# Patient Record
Sex: Male | Born: 1975
Health system: Southern US, Community
[De-identification: ages and names within clinical notes are randomized; demographics above are authoritative.]

## PROBLEM LIST (undated history)

## (undated) DIAGNOSIS — E119 Type 2 diabetes mellitus without complications: Secondary | ICD-10-CM

## (undated) DIAGNOSIS — J45909 Unspecified asthma, uncomplicated: Secondary | ICD-10-CM

## (undated) DIAGNOSIS — I10 Essential (primary) hypertension: Secondary | ICD-10-CM

## (undated) DIAGNOSIS — B019 Varicella without complication: Secondary | ICD-10-CM

## (undated) DIAGNOSIS — F419 Anxiety disorder, unspecified: Secondary | ICD-10-CM

## (undated) DIAGNOSIS — R0683 Snoring: Secondary | ICD-10-CM

## (undated) DIAGNOSIS — G4733 Obstructive sleep apnea (adult) (pediatric): Secondary | ICD-10-CM

## (undated) DIAGNOSIS — G5721 Lesion of femoral nerve, right lower limb: Secondary | ICD-10-CM

## (undated) DIAGNOSIS — K219 Gastro-esophageal reflux disease without esophagitis: Secondary | ICD-10-CM

## (undated) DIAGNOSIS — K648 Other hemorrhoids: Secondary | ICD-10-CM

## (undated) DIAGNOSIS — T7840XA Allergy, unspecified, initial encounter: Secondary | ICD-10-CM

## (undated) DIAGNOSIS — F329 Major depressive disorder, single episode, unspecified: Secondary | ICD-10-CM

## (undated) DIAGNOSIS — G43909 Migraine, unspecified, not intractable, without status migrainosus: Secondary | ICD-10-CM

## (undated) DIAGNOSIS — Z973 Presence of spectacles and contact lenses: Secondary | ICD-10-CM

## (undated) DIAGNOSIS — Z9989 Dependence on other enabling machines and devices: Secondary | ICD-10-CM

## (undated) HISTORY — DX: Gastro-esophageal reflux disease without esophagitis: K21.9

## (undated) HISTORY — DX: Major depressive disorder, single episode, unspecified: F32.9

## (undated) HISTORY — DX: Lesion of femoral nerve, right lower limb: G57.21

## (undated) HISTORY — DX: Anxiety disorder, unspecified: F41.9

## (undated) HISTORY — DX: Snoring: R06.83

## (undated) HISTORY — DX: Migraine, unspecified, not intractable, without status migrainosus: G43.909

## (undated) HISTORY — PX: EYE SURGERY: SHX253

## (undated) HISTORY — DX: Allergy, unspecified, initial encounter: T78.40XA

## (undated) HISTORY — DX: Unspecified asthma, uncomplicated: J45.909

## (undated) HISTORY — DX: Obstructive sleep apnea (adult) (pediatric): G47.33

## (undated) HISTORY — DX: Other hemorrhoids: K64.8

## (undated) HISTORY — DX: Presence of spectacles and contact lenses: Z97.3

## (undated) HISTORY — DX: Varicella without complication: B01.9

## (undated) HISTORY — DX: Dependence on other enabling machines and devices: Z99.89

---

## 2012-04-03 DIAGNOSIS — G4733 Obstructive sleep apnea (adult) (pediatric): Secondary | ICD-10-CM

## 2012-04-03 HISTORY — DX: Obstructive sleep apnea (adult) (pediatric): G47.33

## 2015-06-05 ENCOUNTER — Emergency Department (HOSPITAL_BASED_OUTPATIENT_CLINIC_OR_DEPARTMENT_OTHER): Payer: Self-pay

## 2015-06-05 ENCOUNTER — Encounter (HOSPITAL_BASED_OUTPATIENT_CLINIC_OR_DEPARTMENT_OTHER): Payer: Self-pay | Admitting: *Deleted

## 2015-06-05 ENCOUNTER — Other Ambulatory Visit: Payer: Self-pay

## 2015-06-05 ENCOUNTER — Emergency Department (HOSPITAL_BASED_OUTPATIENT_CLINIC_OR_DEPARTMENT_OTHER)
Admission: EM | Admit: 2015-06-05 | Discharge: 2015-06-05 | Disposition: A | Discharge status: Home / Self Care | Payer: Self-pay | Attending: Emergency Medicine | Admitting: Emergency Medicine

## 2015-06-05 DIAGNOSIS — M25512 Pain in left shoulder: Secondary | ICD-10-CM | POA: Insufficient documentation

## 2015-06-05 DIAGNOSIS — R05 Cough: Secondary | ICD-10-CM | POA: Insufficient documentation

## 2015-06-05 DIAGNOSIS — R079 Chest pain, unspecified: Secondary | ICD-10-CM | POA: Insufficient documentation

## 2015-06-05 DIAGNOSIS — I10 Essential (primary) hypertension: Secondary | ICD-10-CM | POA: Insufficient documentation

## 2015-06-05 DIAGNOSIS — R110 Nausea: Secondary | ICD-10-CM | POA: Insufficient documentation

## 2015-06-05 DIAGNOSIS — R0602 Shortness of breath: Secondary | ICD-10-CM | POA: Insufficient documentation

## 2015-06-05 DIAGNOSIS — Z9981 Dependence on supplemental oxygen: Secondary | ICD-10-CM | POA: Insufficient documentation

## 2015-06-05 HISTORY — DX: Essential (primary) hypertension: I10

## 2015-06-05 LAB — TROPONIN I: Troponin I: <0.03 ng/mL (ref <0.031)

## 2015-06-05 LAB — COMPREHENSIVE METABOLIC PANEL
ALBUMIN: 4.4 g/dL (ref 3.5–5.0)
ALT: 23 U/L (ref 17–63)
AST: 22 U/L (ref 15–41)
Alkaline Phosphatase: 44 U/L (ref 38–126)
Anion gap: 7 (ref 5–15)
BUN: 12 mg/dL (ref 6–20)
CHLORIDE: 108 mmol/L (ref 101–111)
CO2: 23 mmol/L (ref 22–32)
Calcium: 8.7 mg/dL — ABNORMAL LOW (ref 8.9–10.3)
Creatinine, Ser: 1.03 mg/dL (ref 0.61–1.24)
GFR calc Af Amer: >60 mL/min (ref >60)
GFR calc non Af Amer: >60 mL/min (ref >60)
GLUCOSE: 90 mg/dL (ref 65–99)
POTASSIUM: 4 mmol/L (ref 3.5–5.1)
Sodium: 138 mmol/L (ref 135–145)
Total Bilirubin: 0.7 mg/dL (ref 0.3–1.2)
Total Protein: 7.6 g/dL (ref 6.5–8.1)

## 2015-06-05 LAB — PROTIME-INR
INR: 0.94 (ref 0.00–1.49)
Prothrombin Time: 12.8 seconds (ref 11.6–15.2)

## 2015-06-05 LAB — CBC
HEMATOCRIT: 42.5 % (ref 39.0–52.0)
Hemoglobin: 13.8 g/dL (ref 13.0–17.0)
MCH: 28.3 pg (ref 26.0–34.0)
MCHC: 32.5 g/dL (ref 30.0–36.0)
MCV: 87.3 fL (ref 78.0–100.0)
PLATELETS: 276 10*3/uL (ref 150–400)
RBC: 4.87 MIL/uL (ref 4.22–5.81)
RDW: 14 % (ref 11.5–15.5)
WBC: 7.9 10*3/uL (ref 4.0–10.5)

## 2015-06-05 LAB — APTT: APTT: 31 s (ref 24–37)

## 2015-06-05 MED ORDER — SODIUM CHLORIDE 0.9 % IV SOLN
20.0000 mL | INTRAVENOUS | Status: DC
  Administered 2015-06-05: 20 mL via INTRAVENOUS

## 2015-06-05 NOTE — ED Provider Notes (Signed)
CSN: [id number]     Arrival date & time 06/05/15  1945 History  By signing my name below, I, Helane Gunther, attest that this documentation has been prepared under the direction and in the presence of Pattricia Boss, MD. Electronically Signed: Helane Gunther, ED Scribe. 06/05/2015. 8:50 PM.    Chief Complaint  Patient presents with  . Chest Pain   The history is provided by the patient. No language interpreter was used.   HPI Comments: Christopher Ingram is a 40 y.o. male with a PMHx of HTN who presents to the Emergency Department complaining of sharp, pinching, intermittent left-sided chest pain onset 1 hour ago. He reports associated pain in the left shoulder and feeling as though he "hit the funny bone in [his] left elbow. He also endorses productive cough and post-tussive nausea onset 1 week ago. He notes he uses a CPAP machine to sleep and found some fungus in it a few days ago. He states he has since cleaned the machine. He states he has taken some Tylenol at home without relief. He states he is supposed to take lisinopril hydrochlorothiazide for his HTN, but does not have insurance to be able to pay for this. He notes a FHx of a fatal MI (father, age 9). Pt denies rhinorrhea, sore throat, chills, body aches, and fever.  Pt is allergic to aspirin.   Past Medical History  Diagnosis Date  . Hypertension    Past Surgical History  Procedure Laterality Date  . Eye surgery     No family history on file. Social History  Substance Use Topics  . Smoking status: Never Smoker   . Smokeless tobacco: None  . Alcohol Use: No    Review of Systems  Constitutional: Negative for fever and chills.  HENT: Negative for rhinorrhea and sore throat.   Respiratory: Positive for cough and shortness of breath.   Cardiovascular: Positive for chest pain.  Gastrointestinal: Positive for nausea.  Musculoskeletal: Positive for arthralgias. Negative for myalgias.  All other systems reviewed and are  negative.   Allergies  Aspirin  Home Medications   Prior to Admission medications   Not on File   BP 158/108 mmHg  Pulse 64  Temp(Src) 98 F (36.7 C) (Oral)  Resp 20  Ht 6\' 3"  (1.905 m)  Wt 335 lb (151.955 kg)  BMI 41.87 kg/m2  SpO2 100% Physical Exam  Constitutional: He is oriented to person, place, and time. He appears well-developed and well-nourished.  HENT:  Head: Normocephalic and atraumatic.  Right Ear: External ear normal.  Left Ear: External ear normal.  Nose: Nose normal.  Mouth/Throat: Oropharynx is clear and moist.  Eyes: Conjunctivae and EOM are normal. Pupils are equal, round, and reactive to light.  Neck: Normal range of motion. Neck supple.  Cardiovascular: Normal rate, regular rhythm, normal heart sounds and intact distal pulses.   Pulmonary/Chest: Effort normal and breath sounds normal. No respiratory distress. He has no wheezes. He exhibits tenderness.  Abdominal: Soft. Bowel sounds are normal. He exhibits no distension and no mass. There is no tenderness. There is no guarding.  Musculoskeletal: Normal range of motion.  Neurological: He is alert and oriented to person, place, and time. He has normal reflexes. He exhibits normal muscle tone. Coordination normal.  Skin: Skin is warm and dry.  Psychiatric: He has a normal mood and affect. His behavior is normal. Judgment and thought content normal.  Nursing note and vitals reviewed.   ED Course  Procedures  DIAGNOSTIC STUDIES: Oxygen  Saturation is 100% on RA, normal by my interpretation.    COORDINATION OF CARE: 8:47 PM - Discussed plans to order diagnostic studies. Pt advised of plan for treatment and pt agrees.  Labs Review Labs Reviewed  COMPREHENSIVE METABOLIC PANEL - Abnormal; Notable for the following:    Calcium 8.7 (*)    All other components within normal limits  APTT  CBC  PROTIME-INR  TROPONIN I  TROPONIN I  TROPONIN I    Imaging Review Dg Chest Portable 1 View  06/05/2015   CLINICAL DATA:  Left-sided chest and shoulder pain for 2 hours. EXAM: PORTABLE CHEST 1 VIEW COMPARISON:  None. FINDINGS: Very low lung volumes leading to crowding of bronchovascular structures. Heart at the upper limits of normal in size, likely accentuated by technique. No confluent airspace disease, large pleural effusion or pneumothorax. No acute osseous abnormalities are seen. IMPRESSION: Hypoventilatory chest. Prominence of the cardiac size may be accentuated by low lung volumes and portable AP technique. Electronically Signed   By: Jeb Levering M.D.   On: 06/05/2015 21:55   I have personally reviewed and evaluated these images and lab results as part of my medical decision-making.   EKG Interpretation   Date/Time:  Monday June 05 2015 19:52:28 EST Ventricular Rate:  59 PR Interval:  132 QRS Duration: 120 QT Interval:  450 QTC Calculation: 445 R Axis:   4 Text Interpretation:  Sinus bradycardia Left ventricular hypertrophy with  QRS widening Abnormal ECG Confirmed by Madisun Hargrove MD, Andee Poles [id number]) on  06/05/2015 8:41:36 PM      MDM   Final diagnoses:  Chest pain, unspecified chest pain type   I personally performed the services described in this documentation, which was scribed in my presence. The recorded information has been reviewed and considered.   Pattricia Boss, MD 06/08/15 1500

## 2015-06-05 NOTE — Discharge Instructions (Signed)

## 2015-06-05 NOTE — ED Notes (Signed)
Pain in his left shoulder and chest for an hour. Dizzy earlier in the day.

## 2015-06-05 NOTE — ED Notes (Signed)
Pt placed on cardiac monitor with automatic vitals Q30.

## 2015-12-28 ENCOUNTER — Encounter: Payer: Self-pay | Admitting: Family Medicine

## 2016-01-03 ENCOUNTER — Ambulatory Visit (INDEPENDENT_AMBULATORY_CARE_PROVIDER_SITE_OTHER): Payer: Managed Care, Other (non HMO) | Admitting: Family Medicine

## 2016-01-03 ENCOUNTER — Encounter: Payer: Self-pay | Admitting: Family Medicine

## 2016-01-03 VITALS — BP 144/96 | HR 69 | Temp 98.3°F | Resp 20 | Ht 75.0 in | Wt 320.8 lb

## 2016-01-03 DIAGNOSIS — Z7189 Other specified counseling: Secondary | ICD-10-CM

## 2016-01-03 DIAGNOSIS — G4733 Obstructive sleep apnea (adult) (pediatric): Secondary | ICD-10-CM | POA: Insufficient documentation

## 2016-01-03 DIAGNOSIS — I10 Essential (primary) hypertension: Secondary | ICD-10-CM | POA: Insufficient documentation

## 2016-01-03 DIAGNOSIS — Z7689 Persons encountering health services in other specified circumstances: Secondary | ICD-10-CM

## 2016-01-03 DIAGNOSIS — Z9989 Dependence on other enabling machines and devices: Secondary | ICD-10-CM

## 2016-01-03 DIAGNOSIS — Z125 Encounter for screening for malignant neoplasm of prostate: Secondary | ICD-10-CM

## 2016-01-03 DIAGNOSIS — Z8249 Family history of ischemic heart disease and other diseases of the circulatory system: Secondary | ICD-10-CM

## 2016-01-03 LAB — BASIC METABOLIC PANEL WITHOUT GFR
BUN: 18 mg/dL (ref 7–25)
CO2: 19 mmol/L — ABNORMAL LOW (ref 20–31)
Calcium: 9.4 mg/dL (ref 8.6–10.3)
Chloride: 106 mmol/L (ref 98–110)
Creat: 1 mg/dL (ref 0.60–1.35)
GFR, Est African American: >89 mL/min
GFR, Est Non African American: >89 mL/min
Glucose, Bld: 86 mg/dL (ref 65–99)
Potassium: 4.4 mmol/L (ref 3.5–5.3)
Sodium: 139 mmol/L (ref 135–146)

## 2016-01-03 MED ORDER — HYDROCHLOROTHIAZIDE 12.5 MG PO CAPS: 12.5000 mg | ORAL_CAPSULE | Freq: Every day | ORAL | 0 refills | Status: DC

## 2016-01-03 NOTE — Patient Instructions (Addendum)
Your blood pressure is just very mildly elevated and you would benefit from starting a low dose medication.  I have called in HCTZ 12.5 mg daily. I will collect kidney function test today as well.  Follow up in 2-4 weeks for physical with all fasting labs.  Please bring CPAP mask/type with you so I can write  Script for yo to take to supply store.

## 2016-01-03 NOTE — Progress Notes (Signed)
Patient ID: Christopher Ingram, male  DOB: 1976-05-06, 40 y.o.   MRN: [id number] Patient Care Team    Relationship Specialty Notifications Start End  [name], DO PCP - General Family Medicine  01/03/16     Subjective:  Christopher Ingram is a 40 y.o.  male present for new patient establishment. All past medical history, surgical history, allergies, family history, immunizations, medications and social history were obtained and entered in the electronic medical record today. All prior records requested/reviewed. All recent labs, ED visits and hospitalizations within the last year were reviewed.  Eye doctor: [facility] 12/2015  Hypertension; Pt has been prescribed lisinopril/HCTZ 10-12.5, until he lost his insurance and has not been able to afford doctor visits. He reports he has lost >30 lbs since that time by watching diet and portion control. He denies chest pain, shortness of breath, dizziness, more frequent headaches, visual changes or LE edema.   Health maintenance:  Colonoscopy: py states he did not have colonoscopy. He does have internal hemorrhoids. No Fhx of colon cancer Immunizations: tdap 2013, Influenza UTD (encouraged yearly) Infectious disease screening: unknown.  PSA: No results found for: PSA; no fhx    Immunization History  Administered Date(s) Administered  . Tdap 10/31/2011     Past Medical History:  Diagnosis Date  . Allergy   . 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 Anxiety   . Asthma   . Chicken pox   . Depression, major ([place])   . GERD (gastroesophageal reflux disease)   . Hypertension   . Internal hemorrhoids    rectal bleeding, colonoscopy  . Migraine   . OSA on CPAP 04/2012   AHI23  . RAD (reactive airway disease)    ? asthma, normal studies, inhaler use  . Snoring   . Wears glasses    [facility] at [facility]  . Aspirin     hives   Past  Surgical History:  Procedure Laterality Date  . EYE SURGERY     lazy eye   Family History  Problem Relation Age of Onset  . Diabetes Mother   . Diabetes Father   . Early death Father     heart attack 36  . Breast cancer Sister   . Liver cancer Sister   . Mental illness Brother    Social History   Social History  . Marital status: Married    Spouse name: N/A  . Number of children: N/A  . Years of education: N/A   Occupational History  . Not on file.   Social History Main Topics  . Smoking status: Never Smoker  . Smokeless tobacco: Never Used  . Alcohol use No  . Drug use: No  . Sexual activity: Yes    Partners: Female    Birth control/ protection: None     Comment: married   Other Topics Concern  . Not on file   Social History Narrative   Married to [employer] [facility]). Has one child named [name] (special needs).    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    Denies tobacco, drugs or etoh.    Drinks caffeine. Takes a daily vitamin.    Wears seatbelt. Smoke detector in the home.    Exercises routinely.    Feels safe in his relationships      Medication List       Accurate as of  01/03/16  6:24 PM. Always use your most recent med list.          hydrochlorothiazide 12.5 MG capsule Commonly known as:  MICROZIDE Take 1 capsule (12.5 mg total) by mouth daily.        No results found for this or any previous visit (from the past 2160 hour(s)).  Dg Chest Portable 1 View  Result Date: 06/05/2015 CLINICAL DATA:  Left-sided chest and shoulder pain for 2 hours. EXAM: PORTABLE CHEST 1 VIEW COMPARISON:  None. FINDINGS: Very low lung volumes leading to crowding of bronchovascular structures. Heart at the upper limits of normal in size, likely accentuated by technique. No confluent airspace disease, large pleural effusion or pneumothorax. No acute osseous abnormalities are seen. IMPRESSION: Hypoventilatory chest. Prominence of the cardiac size may be accentuated by  low lung volumes and portable AP technique. Electronically Signed   By: Jeb Levering M.D.   On: 06/05/2015 21:55     ROS: 14 pt review of systems performed and negative (unless mentioned in an HPI)  Objective: BP (!) 144/96   Pulse 69   Temp 98.3 F (36.8 C) (Oral)   Resp 20   Ht 6\' 3"  (1.905 m)   Wt (!) 320 lb 12.8 oz (145.5 kg)   SpO2 97%   BMI 40.10 kg/m  Gen: Afebrile. No acute distress. Nontoxic in appearance, well-developed, well-nourished,  Pleasant, obese, caucasian male.  HENT: AT. [place]. MMM, no Cough on exam, no hoarseness on exam. Eyes:Pupils Equal Round Reactive to light, Extraocular movements intact,  Conjunctiva without redness, discharge or icterus. Neck/lymp/endocrine: Supple,no lymphadenopathy, mild enlargement/ thyromegaly CV: RRR, no edema, +2/4 P posterior tibialis pulses.  Chest: CTAB, no wheeze, rhonchi or crackles.  Abd: Soft. obese. NTND. BS present.  Skin:Warm and well-perfused. Skin intact. Neuro/Msk: Normal gait. PERLA.  Alert. Oriented x3.   Psych: Normal affect, dress and demeanor. Normal speech. Normal thought content and judgment.   Assessment/plan: Christopher Ingram is a 40 y.o. male present for establishment of care.  Essential hypertension - Although pt lost a great deal of weight since last recorded BP and medications, he is still mildly above goal.  - will start HCTZ low dose today.  - BASIC METABOLIC PANEL WITH GFR - pt will schedule CPE with 3 weeks, with fasting labs prior. Will recheck BP at that time.  - low salt diet encouraged.   OSA on CPAP - pt to bring in CPAP make/model and will write script for head gear/supplies.   Morbid obesity, unspecified obesity type ([place]) - pt is currently dieting and exercising and has been successful losing weight over the last few months.  - congratulated him on his success and encouraged him to continue.  Fasting labs placed for CPE in 3 weeks.   Greater than 45 minutes was spent with patient,  greater than 50% of that time was spent face-to-face with patient counseling and coordinating care.    Return in about 3 weeks (around 01/24/2016) for CPE.  Electronically signed by: Howard Pouch, DO [place]

## 2016-01-04 ENCOUNTER — Telehealth: Payer: Self-pay | Admitting: Family Medicine

## 2016-01-04 NOTE — Telephone Encounter (Signed)
Please call pt: - his kidneys are functioning  normally. Labs stable.

## 2016-01-04 NOTE — Telephone Encounter (Signed)
Spoke with patient reviewed lab results. 

## 2016-01-19 ENCOUNTER — Other Ambulatory Visit (INDEPENDENT_AMBULATORY_CARE_PROVIDER_SITE_OTHER): Payer: Managed Care, Other (non HMO)

## 2016-01-19 DIAGNOSIS — G4733 Obstructive sleep apnea (adult) (pediatric): Secondary | ICD-10-CM | POA: Diagnosis not present

## 2016-01-19 DIAGNOSIS — Z8249 Family history of ischemic heart disease and other diseases of the circulatory system: Secondary | ICD-10-CM | POA: Diagnosis not present

## 2016-01-19 DIAGNOSIS — I10 Essential (primary) hypertension: Secondary | ICD-10-CM

## 2016-01-19 DIAGNOSIS — Z125 Encounter for screening for malignant neoplasm of prostate: Secondary | ICD-10-CM | POA: Diagnosis not present

## 2016-01-19 DIAGNOSIS — Z9989 Dependence on other enabling machines and devices: Secondary | ICD-10-CM

## 2016-01-19 LAB — CBC WITH DIFFERENTIAL/PLATELET
BASOS ABS: 0 10*3/uL (ref 0.0–0.1)
Basophils Relative: 0.4 % (ref 0.0–3.0)
Eosinophils Absolute: 0.4 10*3/uL (ref 0.0–0.7)
Eosinophils Relative: 6.1 % — ABNORMAL HIGH (ref 0.0–5.0)
HEMATOCRIT: 40.1 % (ref 39.0–52.0)
HEMOGLOBIN: 13.4 g/dL (ref 13.0–17.0)
LYMPHS PCT: 29.5 % (ref 12.0–46.0)
Lymphs Abs: 1.9 10*3/uL (ref 0.7–4.0)
MCHC: 33.3 g/dL (ref 30.0–36.0)
MCV: 86.3 fl (ref 78.0–100.0)
MONOS PCT: 7.6 % (ref 3.0–12.0)
Monocytes Absolute: 0.5 10*3/uL (ref 0.1–1.0)
NEUTROS ABS: 3.7 10*3/uL (ref 1.4–7.7)
Neutrophils Relative %: 56.4 % (ref 43.0–77.0)
PLATELETS: 271 10*3/uL (ref 150.0–400.0)
RBC: 4.64 Mil/uL (ref 4.22–5.81)
RDW: 13.8 % (ref 11.5–15.5)
WBC: 6.5 10*3/uL (ref 4.0–10.5)

## 2016-01-19 LAB — COMPREHENSIVE METABOLIC PANEL
ALBUMIN: 4.1 g/dL (ref 3.5–5.2)
ALK PHOS: 38 U/L — AB (ref 39–117)
ALT: 23 U/L (ref 0–53)
AST: 16 U/L (ref 0–37)
BILIRUBIN TOTAL: 0.3 mg/dL (ref 0.2–1.2)
BUN: 13 mg/dL (ref 6–23)
CALCIUM: 9.4 mg/dL (ref 8.4–10.5)
CO2: 27 mEq/L (ref 19–32)
CREATININE: 0.94 mg/dL (ref 0.40–1.50)
Chloride: 106 mEq/L (ref 96–112)
GFR: 93.51 mL/min (ref >60.00)
Glucose, Bld: 91 mg/dL (ref 70–99)
Potassium: 3.9 mEq/L (ref 3.5–5.1)
Sodium: 140 mEq/L (ref 135–145)
TOTAL PROTEIN: 6.5 g/dL (ref 6.0–8.3)

## 2016-01-19 LAB — TSH: TSH: 1.77 u[IU]/mL (ref 0.35–4.50)

## 2016-01-19 LAB — LIPID PANEL
CHOL/HDL RATIO: 4
Cholesterol: 178 mg/dL (ref 0–200)
HDL: 49.6 mg/dL (ref >39.00)
LDL CALC: 96 mg/dL (ref 0–99)
NonHDL: 128.43
TRIGLYCERIDES: 164 mg/dL — AB (ref 0.0–149.0)
VLDL: 32.8 mg/dL (ref 0.0–40.0)

## 2016-01-19 LAB — PSA: PSA: 0.66 ng/mL (ref 0.10–4.00)

## 2016-01-19 LAB — HEMOGLOBIN A1C: Hgb A1c MFr Bld: 5.6 % (ref 4.6–6.5)

## 2016-01-22 ENCOUNTER — Other Ambulatory Visit: Payer: Managed Care, Other (non HMO)

## 2016-01-23 ENCOUNTER — Telehealth: Payer: Self-pay | Admitting: Family Medicine

## 2016-01-23 NOTE — Telephone Encounter (Signed)
Opened in error

## 2016-01-24 ENCOUNTER — Ambulatory Visit (INDEPENDENT_AMBULATORY_CARE_PROVIDER_SITE_OTHER): Payer: Managed Care, Other (non HMO) | Admitting: Family Medicine

## 2016-01-24 ENCOUNTER — Encounter: Payer: Self-pay | Admitting: Family Medicine

## 2016-01-24 VITALS — BP 141/92 | HR 74 | Temp 98.2°F | Resp 20 | Ht 75.0 in | Wt 321.5 lb

## 2016-01-24 DIAGNOSIS — Z0000 Encounter for general adult medical examination without abnormal findings: Secondary | ICD-10-CM

## 2016-01-24 DIAGNOSIS — I10 Essential (primary) hypertension: Secondary | ICD-10-CM | POA: Diagnosis not present

## 2016-01-24 DIAGNOSIS — IMO0001 Reserved for inherently not codable concepts without codable children: Secondary | ICD-10-CM | POA: Insufficient documentation

## 2016-01-24 DIAGNOSIS — Z9989 Dependence on other enabling machines and devices: Secondary | ICD-10-CM

## 2016-01-24 DIAGNOSIS — R030 Elevated blood-pressure reading, without diagnosis of hypertension: Secondary | ICD-10-CM

## 2016-01-24 DIAGNOSIS — G4733 Obstructive sleep apnea (adult) (pediatric): Secondary | ICD-10-CM | POA: Diagnosis not present

## 2016-01-24 NOTE — Patient Instructions (Addendum)
Try HCTZ every other day.  Follow up in 3-6 months on Blood pressure.  I will call in the cpap supplies for you.   Low-Sodium Eating Plan Sodium raises blood pressure and causes water to be held in the body. Getting less sodium from food will help lower your blood pressure, reduce any swelling, and protect your heart, liver, and kidneys. We get sodium by adding salt (sodium chloride) to food. Most of our sodium comes from canned, boxed, and frozen foods. Restaurant foods, fast foods, and pizza are also very high in sodium. Even if you take medicine to lower your blood pressure or to reduce fluid in your body, getting less sodium from your food is important. WHAT IS MY PLAN? Most people should limit their sodium intake to 2,300 mg a day. Your health care provider recommends that you limit your sodium intake to _______2000 mg___ a day.  WHAT DO I NEED TO KNOW ABOUT THIS EATING PLAN? For the low-sodium eating plan, you will follow these general guidelines:  Choose foods with a % Daily Value for sodium of less than 5% (as listed on the food label).   Use salt-free seasonings or herbs instead of table salt or sea salt.   Check with your health care provider or pharmacist before using salt substitutes.   Eat fresh foods.  Eat more vegetables and fruits.  Limit canned vegetables. If you do use them, rinse them well to decrease the sodium.   Limit cheese to 1 oz (28 g) per day.   Eat lower-sodium products, often labeled as "lower sodium" or "no salt added."  Avoid foods that contain monosodium glutamate (MSG). MSG is sometimes added to [place] food and some canned foods.  Check food labels (Nutrition Facts labels) on foods to learn how much sodium is in one serving.  Eat more home-cooked food and less restaurant, buffet, and fast food.  When eating at a restaurant, ask that your food be prepared with less salt, or no salt if possible.  HOW DO I READ FOOD LABELS FOR SODIUM  INFORMATION? The Nutrition Facts label lists the amount of sodium in one serving of the food. If you eat more than one serving, you must multiply the listed amount of sodium by the number of servings. Food labels may also identify foods as:  Sodium free--Less than 5 mg in a serving.  Very low sodium--35 mg or less in a serving.  Low sodium--140 mg or less in a serving.  Light in sodium--50% less sodium in a serving. For example, if a food that usually has 300 mg of sodium is changed to become light in sodium, it will have 150 mg of sodium.  Reduced sodium--25% less sodium in a serving. For example, if a food that usually has 400 mg of sodium is changed to reduced sodium, it will have 300 mg of sodium. WHAT FOODS CAN I EAT? Grains Low-sodium cereals, including oats, puffed wheat and rice, and shredded wheat cereals. Low-sodium crackers. Unsalted rice and pasta. Lower-sodium bread.  Vegetables Frozen or fresh vegetables. Low-sodium or reduced-sodium canned vegetables. Low-sodium or reduced-sodium tomato sauce and paste. Low-sodium or reduced-sodium tomato and vegetable juices.  Fruits Fresh, frozen, and canned fruit. Fruit juice.  Meat and Other Protein Products Low-sodium canned tuna and salmon. Fresh or frozen meat, poultry, seafood, and fish. Lamb. Unsalted nuts. Dried beans, peas, and lentils without added salt. Unsalted canned beans. Homemade soups without salt. Eggs.  Dairy Milk. Soy milk. Ricotta cheese. Low-sodium or reduced-sodium  cheeses. Yogurt.  Condiments Fresh and dried herbs and spices. Salt-free seasonings. Onion and garlic powders. Low-sodium varieties of mustard and ketchup. Fresh or refrigerated horseradish. Lemon juice.  Fats and Oils Reduced-sodium salad dressings. Unsalted butter.  Other Unsalted popcorn and pretzels.  The items listed above may not be a complete list of recommended foods or beverages. Contact your dietitian for more options. WHAT FOODS  ARE NOT RECOMMENDED? Grains Instant hot cereals. Bread stuffing, pancake, and biscuit mixes. Croutons. Seasoned rice or pasta mixes. Noodle soup cups. Boxed or frozen macaroni and cheese. Self-rising flour. Regular salted crackers. Vegetables Regular canned vegetables. Regular canned tomato sauce and paste. Regular tomato and vegetable juices. Frozen vegetables in sauces. Salted [place] fries. Olives. Angie Fava. Relishes. Sauerkraut. Salsa. Meat and Other Protein Products Salted, canned, smoked, spiced, or pickled meats, seafood, or fish. Bacon, ham, sausage, hot dogs, corned beef, chipped beef, and packaged luncheon meats. Salt pork. Jerky. Pickled herring. Anchovies, regular canned tuna, and sardines. Salted nuts. Dairy Processed cheese and cheese spreads. Cheese curds. Blue cheese and cottage cheese. Buttermilk.  Condiments Onion and garlic salt, seasoned salt, table salt, and sea salt. Canned and packaged gravies. Worcestershire sauce. Tartar sauce. Barbecue sauce. Teriyaki sauce. Soy sauce, including reduced sodium. Steak sauce. Fish sauce. Oyster sauce. Cocktail sauce. Horseradish that you find on the shelf. Regular ketchup and mustard. Meat flavorings and tenderizers. Bouillon cubes. Hot sauce. Tabasco sauce. Marinades. Taco seasonings. Relishes. Fats and Oils Regular salad dressings. Salted butter. Margarine. Ghee. Bacon fat.  Other Potato and tortilla chips. Corn chips and puffs. Salted popcorn and pretzels. Canned or dried soups. Pizza. Frozen entrees and pot pies.  The items listed above may not be a complete list of foods and beverages to avoid. Contact your dietitian for more information.   This information is not intended to replace advice given to you by your health care provider. Make sure you discuss any questions you have with your health care provider.   Document Released: 11/09/2001 Document Revised: 06/10/2014 Document Reviewed: 03/24/2013 Elsevier Interactive Patient  Education 2016 [place] Maintenance, Male A healthy lifestyle and preventative care can promote health and wellness.  Maintain regular health, dental, and eye exams.  Eat a healthy diet. Foods like vegetables, fruits, whole grains, low-fat dairy products, and lean protein foods contain the nutrients you need and are low in calories. Decrease your intake of foods high in solid fats, added sugars, and salt. Get information about a proper diet from your health care provider, if necessary.  Regular physical exercise is one of the most important things you can do for your health. Most adults should get at least 150 minutes of moderate-intensity exercise (any activity that increases your heart rate and causes you to sweat) each week. In addition, most adults need muscle-strengthening exercises on 2 or more days a week.   Maintain a healthy weight. The body mass index (BMI) is a screening tool to identify possible weight problems. It provides an estimate of body fat based on height and weight. Your health care provider can find your BMI and can help you achieve or maintain a healthy weight. For males 20 years and older:  A BMI below 18.5 is considered underweight.  A BMI of 18.5 to 24.9 is normal.  A BMI of 25 to 29.9 is considered overweight.  A BMI of 30 and above is considered obese.  Maintain normal blood lipids and cholesterol by exercising and minimizing your intake of saturated fat. Eat a balanced  diet with plenty of fruits and vegetables. Blood tests for lipids and cholesterol should begin at age 41 and be repeated every 5 years. If your lipid or cholesterol levels are high, you are over age 96, or you are at high risk for heart disease, you may need your cholesterol levels checked more frequently.Ongoing high lipid and cholesterol levels should be treated with medicines if diet and exercise are not working.  If you smoke, find out from your health care provider how to quit.  If you do not use tobacco, do not start.  Lung cancer screening is recommended for adults aged 40-80 years who are at high risk for developing lung cancer because of a history of smoking. A yearly low-dose CT scan of the lungs is recommended for people who have at least a 30-pack-year history of smoking and are current smokers or have quit within the past 15 years. A pack year of smoking is smoking an average of 1 pack of cigarettes a day for 1 year (for example, a 30-pack-year history of smoking could mean smoking 1 pack a day for 30 years or 2 packs a day for 15 years). Yearly screening should continue until the smoker has stopped smoking for at least 15 years. Yearly screening should be stopped for people who develop a health problem that would prevent them from having lung cancer treatment.  If you choose to drink alcohol, do not have more than 2 drinks per day. One drink is considered to be 12 oz (360 mL) of beer, 5 oz (150 mL) of wine, or 1.5 oz (45 mL) of liquor.  Avoid the use of street drugs. Do not share needles with anyone. Ask for help if you need support or instructions about stopping the use of drugs.  High blood pressure causes heart disease and increases the risk of stroke. High blood pressure is more likely to develop in:  People who have blood pressure in the end of the normal range (100-139/85-89 mm Hg).  People who are overweight or obese.  People who are African American.  If you are 18-2 years of age, have your blood pressure checked every 3-5 years. If you are 85 years of age or older, have your blood pressure checked every year. You should have your blood pressure measured twice--once when you are at a hospital or clinic, and once when you are not at a hospital or clinic. Record the average of the two measurements. To check your blood pressure when you are not at a hospital or clinic, you can use:  An automated blood pressure machine at a pharmacy.  A home blood pressure  monitor.  If you are 58-88 years old, ask your health care provider if you should take aspirin to prevent heart disease.  Diabetes screening involves taking a blood sample to check your fasting blood sugar level. This should be done once every 3 years after age 81 if you are at a normal weight and without risk factors for diabetes. Testing should be considered at a younger age or be carried out more frequently if you are overweight and have at least 1 risk factor for diabetes.  Colorectal cancer can be detected and often prevented. Most routine colorectal cancer screening begins at the age of 43 and continues through age 71. However, your health care provider may recommend screening at an earlier age if you have risk factors for colon cancer. On a yearly basis, your health care provider may provide home test kits to  check for hidden blood in the stool. A small camera at the end of a tube may be used to directly examine the colon (sigmoidoscopy or colonoscopy) to detect the earliest forms of colorectal cancer. Talk to your health care provider about this at age 87 when routine screening begins. A direct exam of the colon should be repeated every 5-10 years through age 24, unless early forms of precancerous polyps or small growths are found.  People who are at an increased risk for hepatitis B should be screened for this virus. You are considered at high risk for hepatitis B if:  You were born in a country where hepatitis B occurs often. Talk with your health care provider about which countries are considered high risk.  Your parents were born in a high-risk country and you have not received a shot to protect against hepatitis B (hepatitis B vaccine).  You have HIV or AIDS.  You use needles to inject street drugs.  You live with, or have sex with, someone who has hepatitis B.  You are a man who has sex with other men (MSM).  You get hemodialysis treatment.  You take certain medicines for  conditions like cancer, organ transplantation, and autoimmune conditions.  Hepatitis C blood testing is recommended for all people born from [birth year] through 1967 and any individual with known risk factors for hepatitis C.  Healthy men should no longer receive prostate-specific antigen (PSA) blood tests as part of routine cancer screening. Talk to your health care provider about prostate cancer screening.  Testicular cancer screening is not recommended for adolescents or adult males who have no symptoms. Screening includes self-exam, a health care provider exam, and other screening tests. Consult with your health care provider about any symptoms you have or any concerns you have about testicular cancer.  Practice safe sex. Use condoms and avoid high-risk sexual practices to reduce the spread of sexually transmitted infections (STIs).  You should be screened for STIs, including gonorrhea and chlamydia if:  You are sexually active and are younger than 24 years.  You are older than 24 years, and your health care provider tells you that you are at risk for this type of infection.  Your sexual activity has changed since you were last screened, and you are at an increased risk for chlamydia or gonorrhea. Ask your health care provider if you are at risk.  If you are at risk of being infected with HIV, it is recommended that you take a prescription medicine daily to prevent HIV infection. This is called pre-exposure prophylaxis (PrEP). You are considered at risk if:  You are a man who has sex with other men (MSM).  You are a heterosexual man who is sexually active with multiple partners.  You take drugs by injection.  You are sexually active with a partner who has HIV.  Talk with your health care provider about whether you are at high risk of being infected with HIV. If you choose to begin PrEP, you should first be tested for HIV. You should then be tested every 3 months for as long as you are taking  PrEP.  Use sunscreen. Apply sunscreen liberally and repeatedly throughout the day. You should seek shade when your shadow is shorter than you. Protect yourself by wearing long sleeves, pants, a wide-brimmed hat, and sunglasses year round whenever you are outdoors.  Tell your health care provider of new moles or changes in moles, especially if there is a change in shape or  color. Also, tell your health care provider if a mole is larger than the size of a pencil eraser.  A one-time screening for abdominal aortic aneurysm (AAA) and surgical repair of large AAAs by ultrasound is recommended for men aged 68-75 years who are current or former smokers.  Stay current with your vaccines (immunizations).   This information is not intended to replace advice given to you by your health care provider. Make sure you discuss any questions you have with your health care provider.   Document Released: 11/16/2007 Document Revised: 06/10/2014 Document Reviewed: 10/15/2010 Elsevier Interactive Patient Education [employer].

## 2016-01-24 NOTE — Progress Notes (Signed)
Patient ID: Christopher Ingram, male  DOB: 11/08/1975, 40 y.o.   MRN: [id number] Patient Care Team    Relationship Specialty Notifications Start End  [name], DO PCP - General Family Medicine  01/03/16     Subjective:  Christopher Ingram is a 40 y.o. male present for CPE. All past medical history, surgical history, allergies, family history, immunizations, medications and social history were Updated in the electronic medical record today. All recent labs, ED visits and hospitalizations within the last year were reviewed.  Hypertension: Patient was started on 12.5 HCTZ on last appointment for borderline blood pressures and lower extremity edema. Patient states that this medication daily he started to become dizzy, and noticed some "personal side effects ". Patient has been on lisinopril/HCTZ in the past without side effects. Patient endorses after discontinuation of HCTZ he has regained the "water weight "and has had some mild swelling in his lower extremities again.   Obstructive sleep apnea with C Pap: Patient is in need of before meals Pap mask and supplies. He brings his mask and supplies with him today, and they are taped together and falling apart. He states he's had these for quite some time. He brings with him the information on where he purchased CPap machine.  Health maintenance:  Colonoscopy: pt states he did not have colonoscopy. He does have internal hemorrhoids. No Fhx of colon cancer. Screening at age 37. Immunizations: tdap 2013, Influenza UTD (encouraged yearly) Infectious disease screening: unknown. Awaiting records. He is amendable to having completed next year. PSA:  Lab Results  Component Value Date   PSA 0.66 01/19/2016  , pt was counseled on prostate cancer screenings.  Assistive device: none Oxygen use: CPAP at night.  Patient has a Dental home. Hospitalizations/ED visits: None  Immunization History  Administered Date(s) Administered  . Tdap 10/31/2011      Past Medical History:  Diagnosis Date  . Allergy   . 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 Anxiety   . Asthma   . Chicken pox   . Depression, major ([place])   . GERD (gastroesophageal reflux disease)   . Hypertension   . Internal hemorrhoids    rectal bleeding, colonoscopy  . Migraine   . OSA on CPAP 04/2012   AHI23  . RAD (reactive airway disease)    ? asthma, normal studies, inhaler use  . Snoring   . 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 Aspirin     hives   Past Surgical History:  Procedure Laterality Date  . EYE SURGERY     lazy eye   Family History  Problem Relation Age of Onset  . Diabetes Mother   . Diabetes Father   . Early death Father     heart attack 67  . Breast cancer Sister   . Liver cancer Sister   . Mental illness Brother    Social History   Social History  . Marital status: Married    Spouse name: N/A  . Number of children: N/A  . Years of education: N/A   Occupational History  . Not on file.   Social History Main Topics  . Smoking status: Never Smoker  . Smokeless tobacco: Never Used  . Alcohol use No  . Drug use: No  . Sexual activity: Yes    Partners: Female    Birth control/ protection: None     Comment:  married   Other Topics Concern  . Not on file   Social History Narrative   Married to [facility] Has one child named [name] (special needs).    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    Denies tobacco, drugs or etoh.    Drinks caffeine. Takes a daily vitamin.    Wears seatbelt. Smoke detector in the home.    Exercises routinely.    Feels safe in his relationships      Medication List       Accurate as of 01/24/16  8:50 PM. Always use your most recent med list.          hydrochlorothiazide 12.5 MG capsule Commonly known as:  MICROZIDE Take 1 capsule (12.5 mg total) by mouth daily.        Recent Results (from the  past 2160 hour(s))  BASIC METABOLIC PANEL WITH GFR     Status: Abnormal   Collection Time: 01/03/16  2:32 PM  Result Value Ref Range   Sodium 139 135 - 146 mmol/L   Potassium 4.4 3.5 - 5.3 mmol/L   Chloride 106 98 - 110 mmol/L   CO2 19 (L) 20 - 31 mmol/L   Glucose, Bld 86 65 - 99 mg/dL   BUN 18 7 - 25 mg/dL   Creat 1.00 0.60 - 1.35 mg/dL   Calcium 9.4 8.6 - 10.3 mg/dL   GFR, Est African American >89 >=60 mL/min   GFR, Est Non African American >89 >=60 mL/min  TSH     Status: None   Collection Time: 01/19/16  8:17 AM  Result Value Ref Range   TSH 1.77 0.35 - 4.50 uIU/mL  Lipid panel     Status: Abnormal   Collection Time: 01/19/16  8:17 AM  Result Value Ref Range   Cholesterol 178 0 - 200 mg/dL    Comment: ATP III Classification       Desirable:  < 200 mg/dL               Borderline High:  200 - 239 mg/dL          High:  > = 240 mg/dL   Triglycerides 164.0 (H) 0.0 - 149.0 mg/dL    Comment: Normal:  <150 mg/dLBorderline High:  150 - 199 mg/dL   HDL 49.60 >39.00 mg/dL   VLDL 32.8 0.0 - 40.0 mg/dL   LDL Cholesterol 96 0 - 99 mg/dL   Total CHOL/HDL Ratio 4     Comment:                Men          Women1/2 Average Risk     3.4          3.3Average Risk          5.0          4.42X Average Risk          9.6          7.13X Average Risk          15.0          11.0                       NonHDL 128.43     Comment: NOTE:  Non-HDL goal should be 30 mg/dL higher than patient's LDL goal (i.e. LDL goal of < 70 mg/dL, would have non-HDL goal of < 100 mg/dL)  CBC w/Diff  Status: Abnormal   Collection Time: 01/19/16  8:17 AM  Result Value Ref Range   WBC 6.5 4.0 - 10.5 K/uL   RBC 4.64 4.22 - 5.81 Mil/uL   Hemoglobin 13.4 13.0 - 17.0 g/dL   HCT 40.1 39.0 - 52.0 %   MCV 86.3 78.0 - 100.0 fl   MCHC 33.3 30.0 - 36.0 g/dL   RDW 13.8 11.5 - 15.5 %   Platelets 271.0 150.0 - 400.0 K/uL   Neutrophils Relative % 56.4 43.0 - 77.0 %   Lymphocytes Relative 29.5 12.0 - 46.0 %   Monocytes Relative 7.6  3.0 - 12.0 %   Eosinophils Relative 6.1 (H) 0.0 - 5.0 %   Basophils Relative 0.4 0.0 - 3.0 %   Neutro Abs 3.7 1.4 - 7.7 K/uL   Lymphs Abs 1.9 0.7 - 4.0 K/uL   Monocytes Absolute 0.5 0.1 - 1.0 K/uL   Eosinophils Absolute 0.4 0.0 - 0.7 K/uL   Basophils Absolute 0.0 0.0 - 0.1 K/uL  Comp Met (CMET)     Status: Abnormal   Collection Time: 01/19/16  8:17 AM  Result Value Ref Range   Sodium 140 135 - 145 mEq/L   Potassium 3.9 3.5 - 5.1 mEq/L   Chloride 106 96 - 112 mEq/L   CO2 27 19 - 32 mEq/L   Glucose, Bld 91 70 - 99 mg/dL   BUN 13 6 - 23 mg/dL   Creatinine, Ser 0.94 0.40 - 1.50 mg/dL   Total Bilirubin 0.3 0.2 - 1.2 mg/dL   Alkaline Phosphatase 38 (L) 39 - 117 U/L   AST 16 0 - 37 U/L   ALT 23 0 - 53 U/L   Total Protein 6.5 6.0 - 8.3 g/dL   Albumin 4.1 3.5 - 5.2 g/dL   Calcium 9.4 8.4 - 10.5 mg/dL   GFR 93.51 >60.00 mL/min  HgB A1c     Status: None   Collection Time: 01/19/16  8:17 AM  Result Value Ref Range   Hgb A1c MFr Bld 5.6 4.6 - 6.5 %    Comment: Glycemic Control Guidelines for People with Diabetes:Non Diabetic:  <6%Goal of Therapy: <7%Additional Action Suggested:  >8%   PSA     Status: None   Collection Time: 01/19/16  8:17 AM  Result Value Ref Range   PSA 0.66 0.10 - 4.00 ng/mL    Dg Chest Portable 1 View  Result Date: 06/05/2015 CLINICAL DATA:  Left-sided chest and shoulder pain for 2 hours. EXAM: PORTABLE CHEST 1 VIEW COMPARISON:  None. FINDINGS: Very low lung volumes leading to crowding of bronchovascular structures. Heart at the upper limits of normal in size, likely accentuated by technique. No confluent airspace disease, large pleural effusion or pneumothorax. No acute osseous abnormalities are seen. IMPRESSION: Hypoventilatory chest. Prominence of the cardiac size may be accentuated by low lung volumes and portable AP technique. Electronically Signed   By: Jeb Levering M.D.   On: 06/05/2015 21:55     ROS: 14 pt review of systems performed and negative (unless  mentioned in an HPI)  Objective: BP (!) 141/92 (BP Location: Right Arm, Patient Position: Sitting, Cuff Size: Large)   Pulse 74   Temp 98.2 F (36.8 C)   Resp 20   Ht '6\' 3"'  (1.905 m)   Wt (!) 321 lb 8 oz (145.8 kg)   SpO2 94%   BMI 40.18 kg/m  Gen: Afebrile. No acute distress. Nontoxic in appearance, well-developed, well-nourished,  Obese, pleasant, male HENT: AT. [place]. Bilateral  TM visualized and normal in appearance, normal external auditory canal. MMM, no oral lesions, adequate dentition. Bilateral nares within normal limits. Throat without erythema, ulcerations or exudates. No Cough on exam, no hoarseness on exam. Eyes:Pupils Equal Round Reactive to light, Extraocular movements intact,  Conjunctiva without redness, discharge or icterus. Neck/lymp/endocrine: Supple, no lymphadenopathy, no thyromegaly CV: RRR no murmur, trace edema, +2/4 P posterior tibialis pulses.  No JVD. Chest: CTAB, no wheeze, rhonchi or crackles. Normal Respiratory effort. Good Air movement. Abd: Soft. Obese. NTND. BS present. No Masses palpated. No hepatosplenomegaly. No rebound tenderness or guarding. Skin: No rashes, purpura or petechiae. Warm and well-perfused. Skin intact. Neuro/Msk:  Normal gait. PERLA. EOMi. Alert. Oriented x3.  Cranial nerves II through XII intact. Muscle strength 5/5 upper/lower extremity. DTRs equal bilaterally. Psych: Normal affect, dress and demeanor. Normal speech. Normal thought content and judgment.  Assessment/plan: Arrick Christopher Ingram is a 40 y.o. male present for CPE. Encounter for preventive health examination Patient was encouraged to exercise greater than 150 minutes a week. Patient was encouraged to choose a diet filled with fresh fruits and vegetables, and lean meats. AVS provided to patient today for education/recommendation on gender specific health and safety maintenance. All labs reviewed with patient in detail today. OSA on CPAP - Order for CPAP mask and supplies faxed to  [employer].  Essential hypertension/morbid obesity - Patient felt he became dizzy with HCTZ 12.5 mg daily, he did not follow-up to have blood pressure rechecks before discontinuing medication. He also thought it gave him sexual side effects. Patient has been on this medication in the past without any side effects. His blood pressures are mildly elevated. Encouraged him to eat a low-salt diet and AVS on a low-salt diet was provided to him. I encouraged him to attempt to take HCTZ 12.5 mg every other day, if anything this may help him with his edema. If blood pressures remain elevated in 6 months, or patient is unable to tolerate HCTZ will need to add lisinopril. He has been on lisinopril/HCTZ in the past. Patient is exercising and watching his diet closely, and has lost a fair amount weight already. He     Return in about 6 months (around 07/26/2016).  Electronically signed by: Howard Pouch, DO [place]

## 2016-01-26 ENCOUNTER — Other Ambulatory Visit: Payer: Self-pay | Admitting: Family Medicine

## 2016-01-30 ENCOUNTER — Ambulatory Visit (INDEPENDENT_AMBULATORY_CARE_PROVIDER_SITE_OTHER): Payer: Managed Care, Other (non HMO) | Admitting: Family Medicine

## 2016-01-30 ENCOUNTER — Encounter: Payer: Self-pay | Admitting: Family Medicine

## 2016-01-30 VITALS — BP 139/94 | HR 67 | Temp 97.5°F | Resp 20 | Wt 324.5 lb

## 2016-01-30 DIAGNOSIS — L089 Local infection of the skin and subcutaneous tissue, unspecified: Secondary | ICD-10-CM

## 2016-01-30 MED ORDER — MUPIROCIN 2 % EX OINT: 1.0000 "application " | TOPICAL_OINTMENT | Freq: Two times a day (BID) | CUTANEOUS | 0 refills | Status: DC

## 2016-01-30 MED ORDER — DOXYCYCLINE HYCLATE 100 MG PO TABS: 100.0000 mg | ORAL_TABLET | Freq: Two times a day (BID) | ORAL | 0 refills | Status: DC

## 2016-01-30 NOTE — Patient Instructions (Signed)
Doxycycline every 12 hours,  Bactroban ointment a few times a day for 7 - 10 days.  Keep clean and dry.  Soapy water and rinse only. No alcohol over area. Pat dry.

## 2016-01-30 NOTE — Progress Notes (Signed)
Christopher Ingram , 1975/10/31, 40 y.o., male MRN: [id number] Patient Care Team    Relationship Specialty Notifications Start End  [name], DO PCP - General Family Medicine  [id number]     CC: umbilical lesion Subjective: Pt presents for an acute OV with complaints of umblical lesion of about a week duration.  Associated symptoms include bleeding, tenderness, redness and drainage. He states he went to clean it out with a q-tip/alcohol and when he rubbed it he received a brownish "chunk full of hair". He denies fever, chills, nausea, vomit or swelling. He denies abd surgeries.    Allergies  Allergen Reactions  . Aspirin     hives   Social History  Substance Use Topics  . Smoking status: Never Smoker  . Smokeless tobacco: Never Used  . Alcohol use No   Past Medical History:  Diagnosis Date  . Allergy   . 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 Anxiety   . Asthma   . Chicken pox   . Depression, major ([place])   . GERD (gastroesophageal reflux disease)   . Hypertension   . Internal hemorrhoids    rectal bleeding, colonoscopy  . Migraine   . OSA on CPAP 04/2012   AHI23  . RAD (reactive airway disease)    ? asthma, normal studies, inhaler use  . Snoring   . 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 EYE SURGERY     lazy eye   Family History  Problem Relation Age of Onset  . Diabetes Mother   . Diabetes Father   . Early death Father     heart attack 1  . Breast cancer Sister   . Liver cancer Sister   . Mental illness Brother      Medication List       Accurate as of 01/30/16 10:47 AM. Always use your most recent med list.          hydrochlorothiazide 12.5 MG capsule Commonly known as:  MICROZIDE TAKE ONE CAPSULE BY MOUTH DAILY       No results found for this or any previous visit (from the past 24 hour(s)). No results found.   ROS: Negative, with the exception  of above mentioned in HPI  Objective:  BP (!) 139/94 (BP Location: Left Arm, Patient Position: Sitting, Cuff Size: Large)   Pulse 67   Temp 97.5 F (36.4 C) (Oral)   Resp 20   Wt (!) 324 lb 8 oz (147.2 kg)   SpO2 96%   BMI 40.56 kg/m  Body mass index is 40.56 kg/m. Gen: Afebrile. No acute distress. Nontoxic in appearance, well developed, well nourished. Morbidly obese male.  HENT: AT. [place].  MMM Eyes:Pupils Equal Round Reactive to light, Extraocular movements intact,  Conjunctiva without redness, discharge or icterus. Neck/lymp/endocrine: Supple,no lymphadenopathy CV: RRR Abd: Soft. obese. NTND. BS present. no Masses palpated. No rebound or guarding.  Skin: No rashes, purpura or petechiae. Superior umbilical erythema, excoriation noted, Mild tenderness,  serosanguinous drainage noted. No fluctuance.  Neuro: Normal gait. PERLA. EOMi. Alert. Oriented x3  Psych: Normal affect, dress and demeanor. Normal speech. Normal thought content and judgment.  Assessment/Plan: Christopher Ingram is a 40 y.o. male present for acute OV for  Skin infection - mild skin infection umbilicus.  - Bactroban ointment prescribed.  - keep clean, dry, wash with soapy watery, rinse well, apply ointment.  -  Pat dry only. No alcohol swabs.  - F/U PRN  > 25 minutes spent with patient, >50% of time spent face to face counseling patient and coordinating care.   electronically signed by:  Howard Pouch, DO  [place]

## 2016-05-15 ENCOUNTER — Ambulatory Visit (INDEPENDENT_AMBULATORY_CARE_PROVIDER_SITE_OTHER): Payer: Managed Care, Other (non HMO)

## 2016-05-15 DIAGNOSIS — Z23 Encounter for immunization: Secondary | ICD-10-CM | POA: Diagnosis not present

## 2016-05-22 ENCOUNTER — Encounter: Payer: Self-pay | Admitting: Family Medicine

## 2016-05-22 ENCOUNTER — Ambulatory Visit (INDEPENDENT_AMBULATORY_CARE_PROVIDER_SITE_OTHER): Payer: Managed Care, Other (non HMO) | Admitting: Family Medicine

## 2016-05-22 VITALS — BP 143/104 | Temp 98.5°F | Resp 20 | Wt 324.8 lb

## 2016-05-22 DIAGNOSIS — I10 Essential (primary) hypertension: Secondary | ICD-10-CM

## 2016-05-22 DIAGNOSIS — F418 Other specified anxiety disorders: Secondary | ICD-10-CM | POA: Diagnosis not present

## 2016-05-22 MED ORDER — SERTRALINE HCL 50 MG PO TABS: ORAL_TABLET | ORAL | 0 refills | Status: DC

## 2016-05-22 MED ORDER — HYDROCHLOROTHIAZIDE 25 MG PO TABS: 25.0000 mg | ORAL_TABLET | Freq: Every day | ORAL | 0 refills | Status: DC

## 2016-05-22 NOTE — Patient Instructions (Signed)
Start zoloft 50 mg daily for 2 weeks and then increase to 100 mg daily. Follow up 4 weeks.   Restart HCTZ and follow up in 1-2 weeks for a nurse visit only to have BP rechecked.

## 2016-05-22 NOTE — Progress Notes (Signed)
Christopher Ingram , [birth year], 40 y.o., male MRN: [id number] Patient Care Team    Relationship Specialty Notifications Start End  [name], DO PCP - General Family Medicine  01/03/16     CC: wants to talk about depression medication Subjective:  Depression/anxiety: Patient states he was on prozac 20 mg and zoloft 50 mg at different times in the past. He felt the zoloft worked better for him. He reports he has trouble focusing with increased Anxiety. His current triggers surround his work life and financial issues at home. He would like to start back on the Zoloft medication.  Mood disorder screen: negative   Depression screen [facility] 2/9 05/22/2016 01/24/2016 01/03/2016  Decreased Interest 1 0 0  Down, Depressed, Hopeless 1 0 0  PHQ - 2 Score 2 0 0  Altered sleeping 2 - -  Tired, decreased energy 2 - -  Change in appetite 3 - -  Feeling bad or failure about yourself  3 - -  Trouble concentrating 2 - -  Moving slowly or fidgety/restless 1 - -  Suicidal thoughts 0 - -  PHQ-9 Score 15 - -   GAD 7 : Generalized Anxiety Score 05/22/2016  Nervous, Anxious, on Edge 3  Control/stop worrying 3  Worry too much - different things 2  Trouble relaxing 2  Restless 0  Easily annoyed or irritable 0  Afraid - awful might happen 3  Total GAD 7 Score 13  Anxiety Difficulty Somewhat difficult      Allergies  Allergen Reactions  . Aspirin     hives   Social History  Substance Use Topics  . Smoking status: Never Smoker  . Smokeless tobacco: Never Used  . Alcohol use No   Past Medical History:  Diagnosis Date  . Allergy   . 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 Anxiety   . Asthma   . Chicken pox   . Depression, major   . GERD (gastroesophageal reflux disease)   . Hypertension   . Internal hemorrhoids    rectal bleeding, colonoscopy  . Migraine   . OSA on CPAP 04/2012   AHI23  . RAD (reactive airway disease)    ? asthma, normal studies, inhaler use   . Snoring   . 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 EYE SURGERY     lazy eye   Family History  Problem Relation Age of Onset  . Diabetes Mother   . Diabetes Father   . Early death Father     heart attack 62  . Breast cancer Sister   . Liver cancer Sister   . Mental illness Brother    Allergies as of 05/22/2016      Reactions   Aspirin    hives      Medication List       Accurate as of 05/22/16  8:41 AM. Always use your most recent med list.          hydrochlorothiazide 12.5 MG capsule Commonly known as:  MICROZIDE TAKE ONE CAPSULE BY MOUTH DAILY       No results found for this or any previous visit (from the past 24 hour(s)). No results found.   ROS: Negative, with the exception of above mentioned in HPI   Objective:  BP (!) 143/104 (BP Location: Left Arm, Patient Position: Sitting, Cuff Size: Large)   Temp 98.5 F (  36.9 C)   Resp 20   Wt (!) 324 lb 12 oz (147.3 kg)   SpO2 98%   BMI 40.59 kg/m  Body mass index is 40.59 kg/m. Gen: Afebrile. No acute distress. Nontoxic in appearance, well developed, well nourished. Obese, Caucasian man HENT: AT. [place].MMM Eyes:Pupils Equal Round Reactive to light, Extraocular movements intact,  Conjunctiva without redness, discharge or icterus. CV: RRR, no edema Chest: CTAB, no wheeze or crackles. Good air movement, normal resp effort.  Abd: Soft. NTND. BS present.  Neuro: Normal gait. PERLA. EOMi. Alert. Oriented x3  Psych: Normal affect, dress and demeanor. Normal speech. Normal thought content and judgment.  Assessment/Plan: Christopher Ingram is a 40 y.o. male present for acute OV for increased anxiety. Anxiety with depression - New - Depression, anxiety and mood disorder screening completed today. Discuss medication therapy, and patient decided he would like to restart the Zoloft. Will start 50 mg daily for 1 week and then increase to 100 mg.  -  follow-up in 4 weeks  Essential hypertension, benign - Not controlled Patient states he ran out of his medications, did not asked for refills. Discussed with him is important to stay on this medication his blood pressure is higher today than it had been when placed on the medication one month ago. Restart HCTZ at increased dose of 25 mg daily. Patient is to follow-up in one to 2 weeks for a nurse visit to have blood pressure rechecked on this medication so that we might know if this is effective or needs to be increased.  > 25 minutes spent with patient, >50% of time spent face to face     electronically signed by:  Howard Pouch, DO  [place]

## 2016-06-04 ENCOUNTER — Other Ambulatory Visit: Payer: Self-pay | Admitting: Family Medicine

## 2016-06-14 ENCOUNTER — Other Ambulatory Visit: Payer: Self-pay | Admitting: Family Medicine

## 2016-06-14 ENCOUNTER — Encounter: Payer: Self-pay | Admitting: *Deleted

## 2016-06-17 ENCOUNTER — Ambulatory Visit (INDEPENDENT_AMBULATORY_CARE_PROVIDER_SITE_OTHER): Payer: Managed Care, Other (non HMO) | Admitting: Family Medicine

## 2016-06-17 ENCOUNTER — Encounter: Payer: Self-pay | Admitting: Family Medicine

## 2016-06-17 VITALS — BP 131/89 | HR 74 | Temp 98.1°F | Resp 20 | Ht 75.0 in | Wt 314.5 lb

## 2016-06-17 DIAGNOSIS — I10 Essential (primary) hypertension: Secondary | ICD-10-CM

## 2016-06-17 DIAGNOSIS — F418 Other specified anxiety disorders: Secondary | ICD-10-CM

## 2016-06-17 MED ORDER — HYDROCHLOROTHIAZIDE 25 MG PO TABS: 25.0000 mg | ORAL_TABLET | Freq: Every day | ORAL | 1 refills | Status: DC

## 2016-06-17 MED ORDER — SERTRALINE HCL 100 MG PO TABS: 100.0000 mg | ORAL_TABLET | Freq: Every day | ORAL | 1 refills | Status: DC

## 2016-06-17 NOTE — Progress Notes (Signed)
Christopher Ingram , December 11, 1975, 41 y.o., male MRN: [id number] Patient Care Team    Relationship Specialty Notifications Start End  [name], DO PCP - General Family Medicine  01/03/16     CC: depression/anxiety/HTN Subjective:  Depression/anxiety: Pt reports compliance with zoloft 100 mg daily. He is experiencing sexual side effects, but he states he is ok dealing with it since he is feeling better. He also has lost 10 lbs. He feels he is only eating when he is hungry now, he thinks the zoloft has helped him in decreasing his appetite.   Prior note:  Patient states he was on prozac 20 mg and zoloft 50 mg at different times in the past. He felt the zoloft worked better for him. He reports he has trouble focusing with increased Anxiety. His current triggers surround his work life and financial issues at home. He would like to start back on the Zoloft medication.  Hypertension: Pt reports compliance with his HCTZ 25 mg. He is also eating a low salt diet and they are working on exercising. He denies chest pain, shortness of breath, or LE edema.   Mood disorder screen: negative 05/22/2016  Depression screen [facility] 2/9 05/22/2016 01/24/2016 01/03/2016  Decreased Interest 1 0 0  Down, Depressed, Hopeless 1 0 0  PHQ - 2 Score 2 0 0  Altered sleeping 2 - -  Tired, decreased energy 2 - -  Change in appetite 3 - -  Feeling bad or failure about yourself  3 - -  Trouble concentrating 2 - -  Moving slowly or fidgety/restless 1 - -  Suicidal thoughts 0 - -  PHQ-9 Score 15 - -   GAD 7 : Generalized Anxiety Score 05/22/2016  Nervous, Anxious, on Edge 3  Control/stop worrying 3  Worry too much - different things 2  Trouble relaxing 2  Restless 0  Easily annoyed or irritable 0  Afraid - awful might happen 3  Total GAD 7 Score 13  Anxiety Difficulty Somewhat difficult      Allergies  Allergen Reactions  . Aspirin     hives   Social History  Substance Use Topics  . Smoking status: Never  Smoker  . Smokeless tobacco: Never Used  . Alcohol use No   Past Medical History:  Diagnosis Date  . Allergy   . 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 Anxiety   . Asthma   . Chicken pox   . Depression, major   . GERD (gastroesophageal reflux disease)   . Hypertension   . Internal hemorrhoids    rectal bleeding, colonoscopy  . Migraine   . OSA on CPAP 04/2012   AHI23  . RAD (reactive airway disease)    ? asthma, normal studies, inhaler use  . Snoring   . 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 EYE SURGERY     lazy eye   Family History  Problem Relation Age of Onset  . Diabetes Mother   . Diabetes Father   . Early death Father     heart attack 5  . Breast cancer Sister   . Liver cancer Sister   . Mental illness Brother    Allergies as of 06/17/2016      Reactions   Aspirin    hives      Medication List       Accurate as of 06/17/16  1:48 PM. Always use your most recent med list.          hydrochlorothiazide 25 MG tablet Commonly known as:  HYDRODIURIL Take 1 tablet (25 mg total) by mouth daily.   sertraline 50 MG tablet Commonly known as:  ZOLOFT Take 2 tablets (100 mg total) by mouth daily. Needs appt prior to anymore refills       No results found for this or any previous visit (from the past 24 hour(s)). No results found.   ROS: Negative, with the exception of above mentioned in HPI   Objective:  BP 131/89 (BP Location: Left Arm, Patient Position: Sitting, Cuff Size: Large)   Pulse 74   Temp 98.1 F (36.7 C)   Resp 20   Ht 6\' 3"  (1.905 m)   Wt (!) 314 lb 8 oz (142.7 kg)   SpO2 99%   BMI 39.31 kg/m  Body mass index is 39.31 kg/m. Gen: Afebrile. No acute distress.  HENT: AT. [place]. MMM  Eyes:Pupils Equal Round Reactive to light, Extraocular movements intact,  Conjunctiva without redness, discharge or icterus. CV: RRR, no edema, +2/4 P  posterior tibialis pulses Chest: CTAB, no wheeze or crackles Psych: Normal affect, dress and demeanor. Normal speech. Normal thought content and judgment.    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 Anxiety with depression - Doing well on zoloft 100 mg QD, refills provided for 6 months - f/u 6 months  Essential hypertension, benign - stable.  - lost 10 lbs.  - continue HCTZ 25 mg qd - low salt diet. Exercise.  - f/u 6 months  electronically signed by:  Howard Pouch, DO  [place]

## 2016-06-17 NOTE — Patient Instructions (Signed)
Refills on all meds for 6 months.  Make sure to only take 1 of the new zoloft tabs, since they are 100 mg each now.    Followup in 6 months on both hypertension and anxiety/depression.

## 2016-09-30 ENCOUNTER — Encounter: Payer: Self-pay | Admitting: Family Medicine

## 2016-09-30 ENCOUNTER — Ambulatory Visit (INDEPENDENT_AMBULATORY_CARE_PROVIDER_SITE_OTHER): Payer: Managed Care, Other (non HMO) | Admitting: Family Medicine

## 2016-09-30 VITALS — BP 129/85 | HR 92 | Temp 98.0°F | Resp 20 | Ht 75.0 in | Wt 334.2 lb

## 2016-09-30 DIAGNOSIS — F418 Other specified anxiety disorders: Secondary | ICD-10-CM | POA: Diagnosis not present

## 2016-09-30 DIAGNOSIS — I10 Essential (primary) hypertension: Secondary | ICD-10-CM | POA: Diagnosis not present

## 2016-09-30 MED ORDER — SERTRALINE HCL 100 MG PO TABS: 100.0000 mg | ORAL_TABLET | Freq: Every day | ORAL | 1 refills | Status: DC

## 2016-09-30 MED ORDER — HYDROCHLOROTHIAZIDE 25 MG PO TABS: 25.0000 mg | ORAL_TABLET | Freq: Every day | ORAL | 1 refills | Status: DC

## 2016-09-30 NOTE — Progress Notes (Signed)
Christopher Ingram , August 30, 1975, 41 y.o., male MRN: [id number] Patient Care Team    Relationship Specialty Notifications Start End  [name], DO PCP - General Family Medicine  01/03/16     CC: depression/anxiety/HTN Subjective:  Depression/anxiety: Patient reports compliance with Zoloft 100 mg daily. He is experiencing sexual side effects, but he states he is ok dealing with it since he is feeling better. He was like refills on his medications today.  Prior note:  Patient states he was on prozac 20 mg and zoloft 50 mg at different times in the past. He felt the zoloft worked better for him. He reports he has trouble focusing with increased Anxiety. His current triggers surround his work life and financial issues at home. He would like to start back on the Zoloft medication.  Hypertension: Pt reports compliance with his HCTZ 25 mg. He is also eating a low salt diet and they are working on exercising. He denies chest pain, shortness of breath or lower extremity swelling.  Mood disorder screen: negative 05/22/2016  Depression screen [facility] 2/9 05/22/2016 01/24/2016 01/03/2016  Decreased Interest 1 0 0  Down, Depressed, Hopeless 1 0 0  PHQ - 2 Score 2 0 0  Altered sleeping 2 - -  Tired, decreased energy 2 - -  Change in appetite 3 - -  Feeling bad or failure about yourself  3 - -  Trouble concentrating 2 - -  Moving slowly or fidgety/restless 1 - -  Suicidal thoughts 0 - -  PHQ-9 Score 15 - -   GAD 7 : Generalized Anxiety Score 05/22/2016  Nervous, Anxious, on Edge 3  Control/stop worrying 3  Worry too much - different things 2  Trouble relaxing 2  Restless 0  Easily annoyed or irritable 0  Afraid - awful might happen 3  Total GAD 7 Score 13  Anxiety Difficulty Somewhat difficult    Allergies  Allergen Reactions  . Aspirin     hives   Social History  Substance Use Topics  . Smoking status: Never Smoker  . Smokeless tobacco: Never Used  . Alcohol use No   Past Medical  History:  Diagnosis Date  . Allergy   . 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 Anxiety   . Asthma   . Chicken pox   . Depression, major   . GERD (gastroesophageal reflux disease)   . Hypertension   . Internal hemorrhoids    rectal bleeding, colonoscopy  . Migraine   . OSA on CPAP 04/2012   AHI23  . RAD (reactive airway disease)    ? asthma, normal studies, inhaler use  . Snoring   . 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 EYE SURGERY     lazy eye   Family History  Problem Relation Age of Onset  . Diabetes Mother   . Diabetes Father   . Early death Father     heart attack 53  . Breast cancer Sister   . Liver cancer Sister   . Mental illness Brother    Allergies as of 09/30/2016      Reactions   Aspirin    hives      Medication List       Accurate as of 09/30/16  2:59 PM. Always use your most recent med list.          hydrochlorothiazide 25 MG tablet  Commonly known as:  HYDRODIURIL Take 1 tablet (25 mg total) by mouth daily.   sertraline 100 MG tablet Commonly known as:  ZOLOFT Take 1 tablet (100 mg total) by mouth daily. Needs appt prior to anymore refills       No results found for this or any previous visit (from the past 24 hour(s)). No results found.   ROS: Negative, with the exception of above mentioned in HPI   Objective:  BP 129/85 (BP Location: Left Arm, Patient Position: Sitting, Cuff Size: Large)   Pulse 92   Temp 98 F (36.7 C)   Resp 20   Ht 6\' 3"  (1.905 m)   Wt (!) 334 lb 4 oz (151.6 kg)   SpO2 97%   BMI 41.78 kg/m  Body mass index is 41.78 kg/m. Gen: Afebrile. No acute distress. Obese, pleasant Caucasian male. HENT: AT. [place].  MMM.  Eyes:Pupils Equal Round Reactive to light, Extraocular movements intact,  Conjunctiva without redness, discharge or icterus. CV: RRR, no edema, +2/4 P posterior tibialis pulses Chest: CTAB, no wheeze or  crackles Abd: Soft. NTND. BS present. Neuro:  Normal gait. PERLA. EOMi. Alert. Oriented.  Psych: Normal affect, dress and demeanor. Normal speech. Normal thought content and judgment..   Assessment/Plan: Christopher Ingram is a 41 y.o. male present for acute OV for increased anxiety. Anxiety with depression - Stable today - Doing well on zoloft 100 mg QD, refills provided for 6 months - f/u 6 months  Essential hypertension, benign - Stable - continue HCTZ 25 mg qd - low salt diet. Exercise.  - f/u 6 months  electronically signed by:  Howard Pouch, DO  [place]

## 2016-09-30 NOTE — Patient Instructions (Addendum)
Great to see both! Follow up every 6 months for chronic issues.   Try to exercise, walking more frequently.

## 2017-01-09 ENCOUNTER — Other Ambulatory Visit: Payer: Self-pay | Admitting: *Deleted

## 2017-01-09 MED ORDER — SERTRALINE HCL 100 MG PO TABS: 100.0000 mg | ORAL_TABLET | Freq: Every day | ORAL | 0 refills | Status: DC

## 2017-01-09 MED ORDER — HYDROCHLOROTHIAZIDE 25 MG PO TABS: 25.0000 mg | ORAL_TABLET | Freq: Every day | ORAL | 0 refills | Status: DC

## 2017-01-14 ENCOUNTER — Other Ambulatory Visit: Payer: Self-pay | Admitting: *Deleted

## 2017-01-14 MED ORDER — SERTRALINE HCL 100 MG PO TABS: 100.0000 mg | ORAL_TABLET | Freq: Every day | ORAL | 0 refills | Status: DC

## 2017-01-14 NOTE — Telephone Encounter (Signed)
Patient called left message requesting [facility] previously been sent to [facility]. [facility].

## 2017-03-22 ENCOUNTER — Other Ambulatory Visit: Payer: Self-pay | Admitting: Family Medicine

## 2017-04-02 ENCOUNTER — Ambulatory Visit: Payer: Managed Care, Other (non HMO) | Admitting: Family Medicine

## 2017-04-02 DIAGNOSIS — Z0289 Encounter for other administrative examinations: Secondary | ICD-10-CM

## 2017-04-07 ENCOUNTER — Ambulatory Visit (INDEPENDENT_AMBULATORY_CARE_PROVIDER_SITE_OTHER): Payer: Managed Care, Other (non HMO) | Admitting: Family Medicine

## 2017-04-07 ENCOUNTER — Encounter: Payer: Self-pay | Admitting: Family Medicine

## 2017-04-07 VITALS — BP 121/87 | HR 76 | Temp 98.1°F | Resp 20 | Ht 75.0 in | Wt 359.0 lb

## 2017-04-07 DIAGNOSIS — F418 Other specified anxiety disorders: Secondary | ICD-10-CM

## 2017-04-07 DIAGNOSIS — Z23 Encounter for immunization: Secondary | ICD-10-CM

## 2017-04-07 DIAGNOSIS — I10 Essential (primary) hypertension: Secondary | ICD-10-CM | POA: Diagnosis not present

## 2017-04-07 DIAGNOSIS — E781 Pure hyperglyceridemia: Secondary | ICD-10-CM | POA: Diagnosis not present

## 2017-04-07 MED ORDER — HYDROCHLOROTHIAZIDE 25 MG PO TABS: 25.0000 mg | ORAL_TABLET | Freq: Every day | ORAL | 1 refills | Status: DC

## 2017-04-07 MED ORDER — SERTRALINE HCL 100 MG PO TABS: 100.0000 mg | ORAL_TABLET | Freq: Every day | ORAL | 1 refills | Status: DC

## 2017-04-07 NOTE — Patient Instructions (Signed)
You look good today. It was good to see you!! Tell the wife I said hello.   Continue multivitamin and add a B12 supplement daily.      Please help [place] help you:  We are honored you have chosen [facility] for your Primary Care home. Below you will find basic instructions that you may need to access in the future. Please help [place] help you by reading the instructions, which cover many of the frequent questions we experience.   Prescription refills and request:  -In order to allow more efficient response time, please call your pharmacy for all refills. They will forward the request electronically to [place]. This allows for the quickest possible response. Request left on a nurse line can take longer to refill, since these are checked as time allows between office patients and other phone calls.  - refill request can take up to 3-5 working days to complete.  - If request is sent electronically and request is appropiate, it is usually completed in 1-2 business days.  - all patients will need to be seen routinely for all chronic medical conditions requiring prescription medications (see follow-up below). If you are overdue for follow up on your condition, you will be asked to make an appointment and we will call in enough medication to cover you until your appointment (up to 30 days).  - all controlled substances will require a face to face visit to request/refill.  - if you desire your prescriptions to go through a new pharmacy, and have an active script at original pharmacy, you will need to call your pharmacy and have scripts transferred to new pharmacy. This is completed between the pharmacy locations and not by your provider.    Results: If any images or labs were ordered, it can take up to 1 week to get results depending on the test ordered and the lab/facility running and resulting the test. - Normal or stable results, which do not need further discussion, may be released to your mychart  immediately with attached note to you. A call may not be generated for normal results. Please make certain to sign up for mychart. If you have questions on how to activate your mychart you can call the front office.  - If your results need further discussion, our office will attempt to contact you via phone, and if unable to reach you after 2 attempts, we will release your abnormal result to your mychart with instructions.  - All results will be automatically released in mychart after 1 week.  - Your provider will provide you with explanation and instruction on all relevant material in your results. Please keep in mind, results and labs may appear confusing or abnormal to the untrained eye, but it does not mean they are actually abnormal for you personally. If you have any questions about your results that are not covered, or you desire more detailed explanation than what was provided, you should make an appointment with your provider to do so.   Our office handles many outgoing and incoming calls daily. If we have not contacted you within 1 week about your results, please check your mychart to see if there is a message first and if not, then contact our office.  In helping with this matter, you help decrease call volume, and therefore allow [place] to be able to respond to patients needs more efficiently.   Acute office visits (sick visit):  An acute visit is intended for a new problem and  are scheduled in shorter time slots to allow schedule openings for patients with new problems. This is the appropriate visit to discuss a new problem. In order to provide you with excellent quality medical care with proper time for you to explain your problem, have an exam and receive treatment with instructions, these appointments should be limited to one new problem per visit. If you experience a new problem, in which you desire to be addressed, please make an acute office visit, we save openings on the schedule to  accommodate you. Please do not save your new problem for any other type of visit, let us take care of it properly and quickly for you.   Follow up visits:  Depending on your condition(s) your provider will need to see you routinely in order to provide you with quality care and prescribe medication(s). Most chronic conditions (Example: hypertension, Diabetes, depression/anxiety... etc), require visits a couple times a year. Your provider will instruct you on proper follow up for your personal medical conditions and history. Please make certain to make follow up appointments for your condition as instructed. Failing to do so could result in lapse in your medication treatment/refills. If you request a refill, and are overdue to be seen on a condition, we will always provide you with a 30 day script (once) to allow you time to schedule.    Medicare wellness (well visit): - we have a wonderful Nurse Maudie Mercury), that will meet with you and provide you will yearly medicare wellness visits. These visits should occur yearly (can not be scheduled less than 1 calendar year apart) and cover preventive health, immunizations, advance directives and screenings you are entitled to yearly through your medicare benefits. Do not miss out on your entitled benefits, this is when medicare will pay for these benefits to be ordered for you.  These are strongly encouraged by your provider and is the appropriate type of visit to make certain you are up to date with all preventive health benefits. If you have not had your medicare wellness exam in the last 12 months, please make certain to schedule one by calling the office and schedule your medicare wellness with Maudie Mercury as soon as possible.   Yearly physical (well visit):  - Adults are recommended to be seen yearly for physicals. Check with your insurance and date of your last physical, most insurances require one calendar year between physicals. Physicals include all preventive health  topics, screenings, medical exam and labs that are appropriate for gender/age and history. You may have fasting labs needed at this visit. This is a well visit (not a sick visit), new problems should not be covered during this visit (see acute visit).  - Pediatric patients are seen more frequently when they are younger. Your provider will advise you on well child visit timing that is appropriate for your their age. - This is not a medicare wellness visit. Medicare wellness exams do not have an exam portion to the visit. Some medicare companies allow for a physical, some do not allow a yearly physical. If your medicare allows a yearly physical you can schedule the medicare wellness with our nurse Maudie Mercury and have your physical with your provider after, on the same day. Please check with insurance for your full benefits.   Late Policy/No Shows:  - all new patients should arrive 15-30 minutes earlier than appointment to allow [place] time  to  obtain all personal demographics,  insurance information and for you to complete office paperwork. -  All established patients should arrive 10-15 minutes earlier than appointment time to update all information and be checked in .  - In our best efforts to run on time, if you are late for your appointment you will be asked to either reschedule or if able, we will work you back into the schedule. There will be a wait time to work you back in the schedule,  depending on availability.  - If you are unable to make it to your appointment as scheduled, please call 24 hours ahead of time to allow [place] to fill the time slot with someone else who needs to be seen. If you do not cancel your appointment ahead of time, you may be charged a no show fee.

## 2017-04-07 NOTE — Progress Notes (Signed)
Christopher Ingram , [birth year], 41 y.o., male MRN: [id number] Patient Care Team    Relationship Specialty Notifications Start End  Christopher Hillock, DO PCP - General Family Medicine  01/03/16     CC: depression/anxiety/HTN Subjective:  Depression/anxiety:  Since for follow-up today on his depression and anxiety. He is well controlled on Zoloft 100 mg daily. He denies any negative side effects at this time. He would like to remain on medication.  Prior note:  Patient states he was on prozac 20 mg and zoloft 50 mg at different times in the past. He felt the zoloft worked better for him. He reports he has trouble focusing with increased Anxiety. His current triggers surround his work life and financial issues at home. He would like to start back on the Zoloft medication.  Hypertension/hypertriglyceridemia:  Pt reports compliance with HCTZ 25 mg QD. Blood pressures ranges at home not routinely checked. Patient denies chest pain, shortness of breath or lower extremity edema.  BMP: 01/19/2016, within normal limits (mildly decreased alkaline phosphatase) CBC: 01/19/2016 within normal limits Limited to: 01/19/2016 total cholesterol 178, HDL 49, LDL 96, triglycerides 164 Diet: Attempts to watch diet and exercise RF: Hypertension, obesity, hyper- triglycerides  Mood disorder screen: negative 05/22/2016  Depression screen [facility] 2/9 04/07/2017 05/22/2016 01/24/2016  Decreased Interest 0 1 0  Down, Depressed, Hopeless 0 1 0  PHQ - 2 Score 0 2 0  Altered sleeping 1 2 -  Tired, decreased energy - 2 -  Change in appetite 2 3 -  Feeling bad or failure about yourself  0 3 -  Trouble concentrating 0 2 -  Moving slowly or fidgety/restless 0 1 -  Suicidal thoughts 0 0 -  PHQ-9 Score 3 15 -  Difficult doing work/chores Not difficult at all - -   GAD 7 : Generalized Anxiety Score 05/22/2016  Nervous, Anxious, on Edge 3  Control/stop worrying 3  Worry too much - different things 2  Trouble relaxing 2    Restless 0  Easily annoyed or irritable 0  Afraid - awful might happen 3  Total GAD 7 Score 13  Anxiety Difficulty Somewhat difficult    Allergies  Allergen Reactions  . Aspirin     hives   Social History   Tobacco Use  . Smoking status: Never Smoker  . Smokeless tobacco: Never Used  Substance Use Topics  . Alcohol use: No   Past Medical History:  Diagnosis Date  . Allergy   . 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 Anxiety   . Asthma   . Chicken pox   . Depression, major   . GERD (gastroesophageal reflux disease)   . Hypertension   . Internal hemorrhoids    rectal bleeding, colonoscopy  . Migraine   . OSA on CPAP 04/2012   AHI23  . RAD (reactive airway disease)    ? asthma, normal studies, inhaler use  . Snoring   . 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 EYE SURGERY     lazy eye   Family History  Problem Relation Age of Onset  . Diabetes Mother   . Diabetes Father   . Early death Father        heart attack 33  . Breast cancer Sister   . Liver cancer Sister   . Mental illness Brother    Allergies as of 04/07/2017  Reactions   Aspirin    hives      Medication List        Accurate as of 04/07/17 10:07 AM. Always use your most recent med list.          hydrochlorothiazide 25 MG tablet Commonly known as:  HYDRODIURIL TAKE 1 TABLET DAILY   sertraline 100 MG tablet Commonly known as:  ZOLOFT Take 1 tablet (100 mg total) by mouth daily. Needs appt prior to anymore refills       No results found for this or any previous visit (from the past 24 hour(s)). No results found.   ROS: Negative, with the exception of above mentioned in HPI   Objective:  BP 121/87 (BP Location: Right Arm, Patient Position: Sitting, Cuff Size: Large)   Pulse 76   Temp 98.1 F (36.7 C)   Resp 20   Ht 6\' 3"  (1.905 m)   Wt (!) 359 lb (162.8 kg)   SpO2 97%   BMI  44.87 kg/m  Body mass index is 44.87 kg/m. Gen: Afebrile. No acute distress. Nontoxic in appearance, well-developed, well-nourished, obese Caucasian male. HENT: AT. [place].  MMM.  Eyes:Pupils Equal Round Reactive to light, Extraocular movements intact,  Conjunctiva without redness, discharge or icterus. CV: RRR no murmur, no edema, +2/4 P posterior tibialis pulses Chest: CTAB, no wheeze or crackles Abd: Soft. Obese. NTND. BS present.  Neuro:  Normal gait. PERLA. EOMi. Alert. Oriented x3  Psych: Normal affect, dress and demeanor. Normal speech. Normal thought content and judgment.  Assessment/Plan: Christopher Ingram is a 41 y.o. male present for acute OV for increased anxiety. Anxiety with depression - Stable today.  - Doing well on zoloft 100 mg QD, refills provided for 6 months - f/u 6 months  Essential hypertension, benign/hypertriglyceridemia/morbid obesity - Stable.  - continue HCTZ 25 mg qd - low salt diet. Exercise.  - Patient encouraged to schedule his physical set up fasting labs can be collected. - f/u 6 months  Influenza vaccine administered today.  electronically signed by:  Howard Pouch, DO  [place]

## 2017-07-18 ENCOUNTER — Encounter: Payer: Self-pay | Admitting: Family Medicine

## 2017-07-18 ENCOUNTER — Ambulatory Visit (INDEPENDENT_AMBULATORY_CARE_PROVIDER_SITE_OTHER): Payer: Managed Care, Other (non HMO) | Admitting: Family Medicine

## 2017-07-18 VITALS — BP 125/85 | HR 82 | Temp 97.6°F | Resp 20 | Ht 75.0 in | Wt 369.8 lb

## 2017-07-18 DIAGNOSIS — I10 Essential (primary) hypertension: Secondary | ICD-10-CM

## 2017-07-18 DIAGNOSIS — F418 Other specified anxiety disorders: Secondary | ICD-10-CM | POA: Diagnosis not present

## 2017-07-18 DIAGNOSIS — R450 Nervousness: Secondary | ICD-10-CM

## 2017-07-18 DIAGNOSIS — E781 Pure hyperglyceridemia: Secondary | ICD-10-CM

## 2017-07-18 LAB — COMPREHENSIVE METABOLIC PANEL
ALBUMIN: 4.2 g/dL (ref 3.5–5.2)
ALT: 22 U/L (ref 0–53)
AST: 15 U/L (ref 0–37)
Alkaline Phosphatase: 45 U/L (ref 39–117)
BUN: 18 mg/dL (ref 6–23)
CALCIUM: 9.1 mg/dL (ref 8.4–10.5)
CO2: 28 mEq/L (ref 19–32)
CREATININE: 0.94 mg/dL (ref 0.40–1.50)
Chloride: 101 mEq/L (ref 96–112)
GFR: 92.85 mL/min (ref >60.00)
Glucose, Bld: 119 mg/dL — ABNORMAL HIGH (ref 70–99)
Potassium: 3.5 mEq/L (ref 3.5–5.1)
Sodium: 138 mEq/L (ref 135–145)
TOTAL PROTEIN: 7.3 g/dL (ref 6.0–8.3)
Total Bilirubin: 0.4 mg/dL (ref 0.2–1.2)

## 2017-07-18 LAB — TSH: TSH: 1.37 u[IU]/mL (ref 0.35–4.50)

## 2017-07-18 LAB — CBC WITH DIFFERENTIAL/PLATELET
BASOS ABS: 0 10*3/uL (ref 0.0–0.1)
BASOS PCT: 0.7 % (ref 0.0–3.0)
EOS ABS: 0.4 10*3/uL (ref 0.0–0.7)
Eosinophils Relative: 6.2 % — ABNORMAL HIGH (ref 0.0–5.0)
HEMATOCRIT: 41.3 % (ref 39.0–52.0)
HEMOGLOBIN: 13.8 g/dL (ref 13.0–17.0)
LYMPHS PCT: 21.6 % (ref 12.0–46.0)
Lymphs Abs: 1.5 10*3/uL (ref 0.7–4.0)
MCHC: 33.4 g/dL (ref 30.0–36.0)
MCV: 86 fl (ref 78.0–100.0)
Monocytes Absolute: 0.5 10*3/uL (ref 0.1–1.0)
Monocytes Relative: 7.7 % (ref 3.0–12.0)
Neutro Abs: 4.3 10*3/uL (ref 1.4–7.7)
Neutrophils Relative %: 63.8 % (ref 43.0–77.0)
Platelets: 335 10*3/uL (ref 150.0–400.0)
RBC: 4.8 Mil/uL (ref 4.22–5.81)
RDW: 14.4 % (ref 11.5–15.5)
WBC: 6.7 10*3/uL (ref 4.0–10.5)

## 2017-07-18 MED ORDER — HYDROCHLOROTHIAZIDE 25 MG PO TABS: 25.0000 mg | ORAL_TABLET | Freq: Every day | ORAL | 1 refills | Status: DC

## 2017-07-18 MED ORDER — SERTRALINE HCL 100 MG PO TABS: 100.0000 mg | ORAL_TABLET | Freq: Every day | ORAL | 1 refills | Status: DC

## 2017-07-18 NOTE — Patient Instructions (Addendum)
Check out this site and plug in your information for your total calorie need daily: https://www.calculator.net Your jitteriness could be from low blood sugars if you are not eating enough throughout the day. Multiple small meals a day, carry fruit or veggies, nuts etc with you.    Mediterranean Diet A Mediterranean diet refers to food and lifestyle choices that are based on the traditions of countries located on the [employer]. This way of eating has been shown to help prevent certain conditions and improve outcomes for people who have chronic diseases, like kidney disease and heart disease. What are tips for following this plan? Lifestyle  Cook and eat meals together with your family, when possible.  Drink enough fluid to keep your urine clear or pale yellow.  Be physically active every day. This includes: ? Aerobic exercise like running or swimming. ? Leisure activities like gardening, walking, or housework.  Get 7-8 hours of sleep each night.  If recommended by your health care provider, drink red wine in moderation. This means 1 glass a day for nonpregnant women and 2 glasses a day for men. A glass of wine equals 5 oz (150 mL). Reading food labels  Check the serving size of packaged foods. For foods such as rice and pasta, the serving size refers to the amount of cooked product, not dry.  Check the total fat in packaged foods. Avoid foods that have saturated fat or trans fats.  Check the ingredients list for added sugars, such as corn syrup. Shopping  At the grocery store, buy most of your food from the areas near the walls of the store. This includes: ? Fresh fruits and vegetables (produce). ? Grains, beans, nuts, and seeds. Some of these may be available in unpackaged forms or large amounts (in bulk). ? Fresh seafood. ? Poultry and eggs. ? Low-fat dairy products.  Buy whole ingredients instead of prepackaged foods.  Buy fresh fruits and vegetables in-season from  local farmers markets.  Buy frozen fruits and vegetables in resealable bags.  If you do not have access to quality fresh seafood, buy precooked frozen shrimp or canned fish, such as tuna, salmon, or sardines.  Buy small amounts of raw or cooked vegetables, salads, or olives from the deli or salad bar at your store.  Stock your pantry so you always have certain foods on hand, such as olive oil, canned tuna, canned tomatoes, rice, pasta, and beans. Cooking  Cook foods with extra-virgin olive oil instead of using butter or other vegetable oils.  Have meat as a side dish, and have vegetables or grains as your main dish. This means having meat in small portions or adding small amounts of meat to foods like pasta or stew.  Use beans or vegetables instead of meat in common dishes like chili or lasagna.  Experiment with different cooking methods. Try roasting or broiling vegetables instead of steaming or sauteing them.  Add frozen vegetables to soups, stews, pasta, or rice.  Add nuts or seeds for added healthy fat at each meal. You can add these to yogurt, salads, or vegetable dishes.  Marinate fish or vegetables using olive oil, lemon juice, garlic, and fresh herbs. Meal planning  Plan to eat 1 vegetarian meal one day each week. Try to work up to 2 vegetarian meals, if possible.  Eat seafood 2 or more times a week.  Have healthy snacks readily available, such as: ? Vegetable sticks with hummus. ? [place] yogurt. ? Fruit and nut trail mix.  Eat balanced meals throughout the week. This includes: ? Fruit: 2-3 servings a day ? Vegetables: 4-5 servings a day ? Low-fat dairy: 2 servings a day ? Fish, poultry, or lean meat: 1 serving a day ? Beans and legumes: 2 or more servings a week ? Nuts and seeds: 1-2 servings a day ? Whole grains: 6-8 servings a day ? Extra-virgin olive oil: 3-4 servings a day  Limit red meat and sweets to only a few servings a month What are my food  choices?  Mediterranean diet ? Recommended ? Grains: Whole-grain pasta. Brown rice. Bulgar wheat. Polenta. Couscous. Whole-wheat bread. [place]. ? Vegetables: Artichokes. Beets. Broccoli. Cabbage. Carrots. Eggplant. Green beans. Chard. Kale. Spinach. Onions. Leeks. Peas. Squash. Tomatoes. Peppers. Radishes. ? Fruits: Apples. Apricots. Avocado. Berries. Bananas. Cherries. Dates. Figs. Grapes. Lemons. Melon. Oranges. Peaches. Plums. Pomegranate. ? Meats and other protein foods: Beans. Almonds. Sunflower seeds. Pine nuts. Peanuts. [place]. Salmon. Scallops. Shrimp. [place]. Tilapia. Clams. Oysters. Eggs. ? Dairy: Low-fat milk. Cheese. Greek yogurt. ? Beverages: Water. Red wine. Herbal tea. ? Fats and oils: Extra virgin olive oil. Avocado oil. Grape seed oil. ? Sweets and desserts: [place] yogurt with honey. Baked apples. Poached pears. Trail mix. ? Seasoning and other foods: Basil. Cilantro. Coriander. Cumin. Mint. Parsley. Sage. Rosemary. Tarragon. Garlic. Oregano. Thyme. Pepper. Balsalmic vinegar. Tahini. Hummus. Tomato sauce. Olives. Mushrooms. ? Limit these ? Grains: Prepackaged pasta or rice dishes. Prepackaged cereal with added sugar. ? Vegetables: Deep fried potatoes (french fries). ? Fruits: Fruit canned in syrup. ? Meats and other protein foods: Beef. Pork. Lamb. Poultry with skin. Hot dogs. [place] Salines. ? Dairy: Ice cream. Sour cream. Whole milk. ? Beverages: Juice. Sugar-sweetened soft drinks. Beer. Liquor and spirits. ? Fats and oils: Butter. Canola oil. Vegetable oil. Beef fat (tallow). Lard. ? Sweets and desserts: Cookies. Cakes. Pies. Candy. ? Seasoning and other foods: Mayonnaise. Premade sauces and marinades. ? The items listed may not be a complete list. Talk with your dietitian about what dietary choices are right for you. Summary  The Mediterranean diet includes both food and lifestyle choices.  Eat a variety of fresh fruits and vegetables, beans, nuts, seeds, and whole  grains.  Limit the amount of red meat and sweets that you eat.  Talk with your health care provider about whether it is safe for you to drink red wine in moderation. This means 1 glass a day for nonpregnant women and 2 glasses a day for men. A glass of wine equals 5 oz (150 mL). This information is not intended to replace advice given to you by your health care provider. Make sure you discuss any questions you have with your health care provider. Document Released: 01/11/2016 Document Revised: 02/13/2016 Document Reviewed: 01/11/2016 Elsevier Interactive Patient Education  [employer].    Please help [place] help you:  We are honored you have chosen [facility] for your Primary Care home. Below you will find basic instructions that you may need to access in the future. Please help [place] help you by reading the instructions, which cover many of the frequent questions we experience.   Prescription refills and request:  -In order to allow more efficient response time, please call your pharmacy for all refills. They will forward the request electronically to [place]. This allows for the quickest possible response. Request left on a nurse line can take longer to refill, since these are checked as time allows between office patients and other phone calls.  - refill request  can take up to 3-5 working days to complete.  - If request is sent electronically and request is appropiate, it is usually completed in 1-2 business days.  - all patients will need to be seen routinely for all chronic medical conditions requiring prescription medications (see follow-up below). If you are overdue for follow up on your condition, you will be asked to make an appointment and we will call in enough medication to cover you until your appointment (up to 30 days).  - all controlled substances will require a face to face visit to request/refill.  - if you desire your prescriptions to go through a new pharmacy, and have an  active script at original pharmacy, you will need to call your pharmacy and have scripts transferred to new pharmacy. This is completed between the pharmacy locations and not by your provider.    Results: If any images or labs were ordered, it can take up to 1 week to get results depending on the test ordered and the lab/facility running and resulting the test. - Normal or stable results, which do not need further discussion, may be released to your mychart immediately with attached note to you. A call may not be generated for normal results. Please make certain to sign up for mychart. If you have questions on how to activate your mychart you can call the front office.  - If your results need further discussion, our office will attempt to contact you via phone, and if unable to reach you after 2 attempts, we will release your abnormal result to your mychart with instructions.  - All results will be automatically released in mychart after 1 week.  - Your provider will provide you with explanation and instruction on all relevant material in your results. Please keep in mind, results and labs may appear confusing or abnormal to the untrained eye, but it does not mean they are actually abnormal for you personally. If you have any questions about your results that are not covered, or you desire more detailed explanation than what was provided, you should make an appointment with your provider to do so.   Our office handles many outgoing and incoming calls daily. If we have not contacted you within 1 week about your results, please check your mychart to see if there is a message first and if not, then contact our office.  In helping with this matter, you help decrease call volume, and therefore allow [place] to be able to respond to patients needs more efficiently.   Acute office visits (sick visit):  An acute visit is intended for a new problem and are scheduled in shorter time slots to allow schedule openings for  patients with new problems. This is the appropriate visit to discuss a new problem. In order to provide you with excellent quality medical care with proper time for you to explain your problem, have an exam and receive treatment with instructions, these appointments should be limited to one new problem per visit. If you experience a new problem, in which you desire to be addressed, please make an acute office visit, we save openings on the schedule to accommodate you. Please do not save your new problem for any other type of visit, let us take care of it properly and quickly for you.   Follow up visits:  Depending on your condition(s) your provider will need to see you routinely in order to provide you with quality care and prescribe medication(s). Most chronic conditions (Example: hypertension,  Diabetes, depression/anxiety... etc), require visits a couple times a year. Your provider will instruct you on proper follow up for your personal medical conditions and history. Please make certain to make follow up appointments for your condition as instructed. Failing to do so could result in lapse in your medication treatment/refills. If you request a refill, and are overdue to be seen on a condition, we will always provide you with a 30 day script (once) to allow you time to schedule.    Medicare wellness (well visit): - we have a wonderful Nurse Maudie Mercury), that will meet with you and provide you will yearly medicare wellness visits. These visits should occur yearly (can not be scheduled less than 1 calendar year apart) and cover preventive health, immunizations, advance directives and screenings you are entitled to yearly through your medicare benefits. Do not miss out on your entitled benefits, this is when medicare will pay for these benefits to be ordered for you.  These are strongly encouraged by your provider and is the appropriate type of visit to make certain you are up to date with all preventive health  benefits. If you have not had your medicare wellness exam in the last 12 months, please make certain to schedule one by calling the office and schedule your medicare wellness with Maudie Mercury as soon as possible.   Yearly physical (well visit):  - Adults are recommended to be seen yearly for physicals. Check with your insurance and date of your last physical, most insurances require one calendar year between physicals. Physicals include all preventive health topics, screenings, medical exam and labs that are appropriate for gender/age and history. You may have fasting labs needed at this visit. This is a well visit (not a sick visit), new problems should not be covered during this visit (see acute visit).  - Pediatric patients are seen more frequently when they are younger. Your provider will advise you on well child visit timing that is appropriate for your their age. - This is not a medicare wellness visit. Medicare wellness exams do not have an exam portion to the visit. Some medicare companies allow for a physical, some do not allow a yearly physical. If your medicare allows a yearly physical you can schedule the medicare wellness with our nurse Maudie Mercury and have your physical with your provider after, on the same day. Please check with insurance for your full benefits.   Late Policy/No Shows:  - all new patients should arrive 15-30 minutes earlier than appointment to allow [place] time  to  obtain all personal demographics,  insurance information and for you to complete office paperwork. - All established patients should arrive 10-15 minutes earlier than appointment time to update all information and be checked in .  - In our best efforts to run on time, if you are late for your appointment you will be asked to either reschedule or if able, we will work you back into the schedule. There will be a wait time to work you back in the schedule,  depending on availability.  - If you are unable to make it to your appointment  as scheduled, please call 24 hours ahead of time to allow [place] to fill the time slot with someone else who needs to be seen. If you do not cancel your appointment ahead of time, you may be charged a no show fee.

## 2017-07-18 NOTE — Progress Notes (Signed)
Christopher Ingram , January 16, 1976, 42 y.o., male MRN: [id number] Patient Care Team    Relationship Specialty Notifications Start End  [name], DO PCP - General Family Medicine  01/03/16     CC: depression/anxiety/HTN Subjective:  Depression/anxiety: Patient reports compliance with Zoloft 100 mg daily and feels the medicine is working well. He is experiencing sexual side effects, but he states he is ok dealing with it since he is feeling better. He was like refills on his medications today. Prior note:  Patient states he was on prozac 20 mg and zoloft 50 mg at different times in the past. He felt the zoloft worked better for him. He reports he has trouble focusing with increased Anxiety. His current triggers surround his work life and financial issues at home. He would like to start back on the Zoloft medication.  Hypertension/morbid obesity/elevated tg : Pt reports compliance with his HCTZ 25 mg. He is also eating a low salt diet. He has not been exercising. His job has him more sedentary as well. He has gained about 50 lbs over the last year. He does not eat much throughout the day and then over eats at night. He admits to feeling "jittery" when he does not eat. Patient denies chest pain, shortness of breath or lower extremity edema.  BMP: 01/19/2016 WNL CBC: 01/19/2016 WNL Lipid: 01/19/2016 C178, H49, L96, Tg164 TSH: ordered today Diet: low sodium Exercise: not routinely RF: HTN, HLD, obesity, FH HD (father MI 39)  Mood disorder screen: negative 05/22/2016  Depression screen [facility] 2/9 07/18/2017 04/07/2017 05/22/2016  Decreased Interest 2 0 1  Down, Depressed, Hopeless 0 0 1  PHQ - 2 Score 2 0 2  Altered sleeping 2 1 2   Tired, decreased energy 1 - 2  Change in appetite 3 2 3   Feeling bad or failure about yourself  0 0 3  Trouble concentrating 0 0 2  Moving slowly or fidgety/restless 0 0 1  Suicidal thoughts 0 0 0  PHQ-9 Score 8 3 15   Difficult doing work/chores - Not difficult at all  -   GAD 7 : Generalized Anxiety Score 07/18/2017 05/22/2016  Nervous, Anxious, on Edge 0 3  Control/stop worrying 0 3  Worry too much - different things 0 2  Trouble relaxing 0 2  Restless 0 0  Easily annoyed or irritable 0 0  Afraid - awful might happen 0 3  Total GAD 7 Score 0 13  Anxiety Difficulty - Somewhat difficult    Allergies  Allergen Reactions  . Aspirin     hives   Social History   Tobacco Use  . Smoking status: Never Smoker  . Smokeless tobacco: Never Used  Substance Use Topics  . Alcohol use: No   Past Medical History:  Diagnosis Date  . Allergy   . 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 Anxiety   . Asthma   . Chicken pox   . Depression, major   . GERD (gastroesophageal reflux disease)   . Hypertension   . Internal hemorrhoids    rectal bleeding, colonoscopy  . Migraine   . OSA on CPAP 04/2012   AHI23  . RAD (reactive airway disease)    ? asthma, normal studies, inhaler use  . Snoring   . 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 EYE SURGERY     lazy eye   Family History  Problem Relation Age of Onset  . Diabetes Mother   . Diabetes Father   . Early death Father        heart attack 70  . Breast cancer Sister   . Liver cancer Sister   . Mental illness Brother    Allergies as of 07/18/2017      Reactions   Aspirin    hives      Medication List        Accurate as of 07/18/17  9:13 AM. Always use your most recent med list.          hydrochlorothiazide 25 MG tablet Commonly known as:  HYDRODIURIL Take 1 tablet (25 mg total) by mouth daily.   sertraline 100 MG tablet Commonly known as:  ZOLOFT Take 1 tablet (100 mg total) by mouth daily.       No results found for this or any previous visit (from the past 24 hour(s)). No results found.  ROS: Negative, with the exception of above mentioned in HPI  Objective:  BP 125/85 (BP  Location: Left Arm, Patient Position: Sitting, Cuff Size: Large)   Pulse 82   Temp 97.6 F (36.4 C)   Resp 20   Ht 6\' 3"  (1.905 m)   Wt (!) 369 lb 12 oz (167.7 kg)   SpO2 98%   BMI 46.22 kg/m  Body mass index is 46.22 kg/m. Gen: Afebrile. No acute distress. Nontoxic in appearance, well developed, well nourished. Obese male.  HENT: AT. [place].  MMM.  Eyes:Pupils Equal Round Reactive to light, Extraocular movements intact,  Conjunctiva without redness, discharge or icterus. CV: RRR no murmur, no edema, +2/4 P posterior tibialis pulses Chest: CTAB, no wheeze or crackles Abd: Soft. NTND. BS present.   Skin: no rashes, purpura or petechiae.  Neuro:  Normal gait. PERLA. EOMi. Alert. Oriented x3  Psych: Normal affect, dress and demeanor. Normal speech. Normal thought content and judgment..    Assessment/Plan: [name] is a 42 y.o. male present for acute OV for increased anxiety. Anxiety with depression - stable. Refills on zoloft.  - Doing well on zoloft 100 mg QD, refills provided for 6 months - f/u 6 months  Essential hypertension, benign/elevated Tg/morbid obesity/jittery - stable. Refills on HCTZ - continue HCTZ 25 mg qd - low salt diet. Exercise MORE. Discussed exercise, stand up desks, dietary modifications, calorie tracking in detail with patient today. He is not eating during the day, likely causing hypoglycemia with his reports of jitteriness. Encouraged small healthy meals throughout the day.  - if sx continue despite those changes then he will need to follow up.  - f/u 6 months   electronically signed by:  Howard Pouch, DO  [place]

## 2017-07-21 ENCOUNTER — Encounter: Payer: Self-pay | Admitting: Family Medicine

## 2017-10-06 ENCOUNTER — Encounter: Payer: Self-pay | Admitting: Family Medicine

## 2017-10-06 ENCOUNTER — Ambulatory Visit (INDEPENDENT_AMBULATORY_CARE_PROVIDER_SITE_OTHER): Payer: Managed Care, Other (non HMO) | Admitting: Family Medicine

## 2017-10-06 VITALS — BP 135/91 | HR 82 | Temp 98.1°F | Ht 75.0 in | Wt 370.6 lb

## 2017-10-06 DIAGNOSIS — J0100 Acute maxillary sinusitis, unspecified: Secondary | ICD-10-CM

## 2017-10-06 MED ORDER — BENZONATATE 200 MG PO CAPS: 200.0000 mg | ORAL_CAPSULE | Freq: Two times a day (BID) | ORAL | 0 refills | Status: DC | PRN

## 2017-10-06 MED ORDER — DOXYCYCLINE HYCLATE 100 MG PO TABS: 100.0000 mg | ORAL_TABLET | Freq: Two times a day (BID) | ORAL | 0 refills | Status: DC

## 2017-10-06 NOTE — Progress Notes (Signed)
Christopher Ingram , 30-Aug-1975, 42 y.o., male MRN: [id number] Patient Care Team    Relationship Specialty Notifications Start End  [name], DO PCP - General Family Medicine  01/03/16     Chief Complaint  Patient presents with  . URI    Pt c/o Cough plus greenish sputum, congestion, headache X 2Weeks. Try Zytec for temporal relief.     Subjective: Pt presents for an OV with complaints of cough of 2 weeks duration.  Associated symptoms include green sputum production, headache, scratchy throat, sinus pressure. He denies fever, chills, nausea or vomit.  Pt has tried Zyrtec- mucinex to ease their symptoms.   Depression screen [facility] 2/9 07/18/2017 04/07/2017 05/22/2016 01/24/2016 01/03/2016  Decreased Interest 2 0 1 0 0  Down, Depressed, Hopeless 0 0 1 0 0  PHQ - 2 Score 2 0 2 0 0  Altered sleeping 2 1 2  - -  Tired, decreased energy 1 - 2 - -  Change in appetite 3 2 3  - -  Feeling bad or failure about yourself  0 0 3 - -  Trouble concentrating 0 0 2 - -  Moving slowly or fidgety/restless 0 0 1 - -  Suicidal thoughts 0 0 0 - -  PHQ-9 Score 8 3 15  - -  Difficult doing work/chores - Not difficult at all - - -    Allergies  Allergen Reactions  . Aspirin     hives   Social History   Tobacco Use  . Smoking status: Never Smoker  . Smokeless tobacco: Never Used  Substance Use Topics  . Alcohol use: No   Past Medical History:  Diagnosis Date  . Allergy   . 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 Anxiety   . Asthma   . Chicken pox   . Depression, major   . GERD (gastroesophageal reflux disease)   . Hypertension   . Internal hemorrhoids    rectal bleeding, colonoscopy  . Migraine   . OSA on CPAP 04/2012   AHI23  . RAD (reactive airway disease)    ? asthma, normal studies, inhaler use  . Snoring   . 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 EYE SURGERY     lazy eye   Family  History  Problem Relation Age of Onset  . Diabetes Mother   . Diabetes Father   . Early death Father        heart attack 28  . Breast cancer Sister   . Liver cancer Sister   . Mental illness Brother    Allergies as of 10/06/2017      Reactions   Aspirin    hives      Medication List        Accurate as of 10/06/17  8:11 AM. Always use your most recent med list.          benzonatate 200 MG capsule Commonly known as:  TESSALON Take 1 capsule (200 mg total) by mouth 2 (two) times daily as needed for cough.   doxycycline 100 MG tablet Commonly known as:  VIBRA-TABS Take 1 tablet (100 mg total) by mouth 2 (two) times daily.   hydrochlorothiazide 25 MG tablet Commonly known as:  HYDRODIURIL Take 1 tablet (25 mg total) by mouth daily.   sertraline 100 MG tablet Commonly known as:  ZOLOFT Take 1 tablet (100 mg total) by  mouth daily.       All past medical history, surgical history, allergies, family history, immunizations andmedications were updated in the EMR today and reviewed under the history and medication portions of their EMR.     ROS: Negative, with the exception of above mentioned in HPI   Objective:  BP (!) 135/91 (BP Location: Left Arm, Patient Position: Sitting, Cuff Size: Large)   Pulse 82   Temp 98.1 F (36.7 C) (Oral)   Ht 6\' 3"  (1.905 m)   Wt (!) 370 lb 9.6 oz (168.1 kg)   SpO2 94%   BMI 46.32 kg/m  Body mass index is 46.32 kg/m. Gen: Afebrile. No acute distress. Nontoxic in appearance, well developed, well nourished.  HENT: AT. [place]. Bilateral TM visualized R>L fullness, no erythema. MMM, no oral lesions. Bilateral nares with erythema, swelling and drainage. Throat without erythema or exudates. Cough and hoarseness present.  Eyes:Pupils Equal Round Reactive to light, Extraocular movements intact,  Conjunctiva without redness, discharge or icterus. Neck/lymp/endocrine: Supple,no lymphadenopathy CV: RRR Chest: CTAB, no wheeze or crackles. Good air  movement, normal resp effort.  Abd: Soft. NTND. BS present.  Skin: no rashes, purpura or petechiae.  Neuro: Normal gait. PERLA. EOMi. Alert. Oriented x3  No exam data present No results found. No results found for this or any previous visit (from the past 24 hour(s)).  Assessment/Plan: Christopher Ingram is a 42 y.o. male present for OV for  Acute non-recurrent maxillary sinusitis Rest, hydrate.  +/- flonase, mucinex (DM if cough), nettie pot or nasal saline.  Doxy prescribed, take until completed.  Tessalon perles.  If cough present it can last up to 6-8 weeks.  F/U 2 weeks of not improved.    Reviewed expectations re: course of current medical issues.  Discussed self-management of symptoms.  Outlined signs and symptoms indicating need for more acute intervention.  Patient verbalized understanding and all questions were answered.  Patient received an After-Visit Summary.    No orders of the defined types were placed in this encounter.    Note is dictated utilizing voice recognition software. Although note has been proof read prior to signing, occasional typographical errors still can be missed. If any questions arise, please do not hesitate to call for verification.   electronically signed by:  Howard Pouch, DO  [place]

## 2017-10-06 NOTE — Patient Instructions (Signed)
Rest, hydrate.  +/- flonase, mucinex (DM if cough), nettie pot or nasal saline.  Doxy prescribed, take until completed.  Tessalon perles.  If cough present it can last up to 6-8 weeks.  F/U 2 weeks of not improved.     Sinusitis, Adult Sinusitis is soreness and inflammation of your sinuses. Sinuses are hollow spaces in the bones around your face. They are located:  Around your eyes.  In the middle of your forehead.  Behind your nose.  In your cheekbones.  Your sinuses and nasal passages are lined with a stringy fluid (mucus). Mucus normally drains out of your sinuses. When your nasal tissues get inflamed or swollen, the mucus can get trapped or blocked so air cannot flow through your sinuses. This lets bacteria, viruses, and funguses grow, and that leads to infection. Follow these instructions at home: Medicines  Take, use, or apply over-the-counter and prescription medicines only as told by your doctor. These may include nasal sprays.  If you were prescribed an antibiotic medicine, take it as told by your doctor. Do not stop taking the antibiotic even if you start to feel better. Hydrate and Humidify  Drink enough water to keep your pee (urine) clear or pale yellow.  Use a cool mist humidifier to keep the humidity level in your home above 50%.  Breathe in steam for 10-15 minutes, 3-4 times a day or as told by your doctor. You can do this in the bathroom while a hot shower is running.  Try not to spend time in cool or dry air. Rest  Rest as much as possible.  Sleep with your head raised (elevated).  Make sure to get enough sleep each night. General instructions  Put a warm, moist washcloth on your face 3-4 times a day or as told by your doctor. This will help with discomfort.  Wash your hands often with soap and water. If there is no soap and water, use hand sanitizer.  Do not smoke. Avoid being around people who are smoking (secondhand smoke).  Keep all follow-up  visits as told by your doctor. This is important. Contact a doctor if:  You have a fever.  Your symptoms get worse.  Your symptoms do not get better within 10 days. Get help right away if:  You have a very bad headache.  You cannot stop throwing up (vomiting).  You have pain or swelling around your face or eyes.  You have trouble seeing.  You feel confused.  Your neck is stiff.  You have trouble breathing. This information is not intended to replace advice given to you by your health care provider. Make sure you discuss any questions you have with your health care provider. Document Released: 11/06/2007 Document Revised: 01/14/2016 Document Reviewed: 03/15/2015 Elsevier Interactive Patient Education  [employer].

## 2017-12-09 ENCOUNTER — Ambulatory Visit (INDEPENDENT_AMBULATORY_CARE_PROVIDER_SITE_OTHER): Payer: Managed Care, Other (non HMO) | Admitting: Family Medicine

## 2017-12-09 ENCOUNTER — Encounter: Payer: Self-pay | Admitting: Family Medicine

## 2017-12-09 VITALS — BP 129/89 | HR 85 | Temp 98.4°F | Resp 20 | Ht 75.0 in | Wt 368.0 lb

## 2017-12-09 DIAGNOSIS — M79675 Pain in left toe(s): Secondary | ICD-10-CM | POA: Diagnosis not present

## 2017-12-09 MED ORDER — PREDNISONE 20 MG PO TABS: ORAL_TABLET | ORAL | 0 refills | Status: DC

## 2017-12-09 MED ORDER — METHYLPREDNISOLONE ACETATE 80 MG/ML IJ SUSP
80.0000 mg | Freq: Once | INTRAMUSCULAR | Status: AC
  Administered 2017-12-09: 80 mg via INTRAMUSCULAR

## 2017-12-09 NOTE — Patient Instructions (Signed)
Start prednisone tomorrow, shot for today.  Naproxen/aleve for pain.  HYDRATE--> water at least 100 ounces daily.  We will call you with labs.  Desk duty for tomorrow.  Follow up in 1 week if not improving, sooner if worsening.    Low-Purine Diet Purines are compounds that affect the level of uric acid in your body. A low-purine diet is a diet that is low in purines. Eating a low-purine diet can prevent the level of uric acid in your body from getting too high and causing gout or kidney stones or both. What do I need to know about this diet?  Choose low-purine foods. Examples of low-purine foods are listed in the next section.  Drink plenty of fluids, especially water. Fluids can help remove uric acid from your body. Try to drink 8-16 cups (1.9-3.8 L) a day.  Limit foods high in fat, especially saturated fat, as fat makes it harder for the body to get rid of uric acid. Foods high in saturated fat include pizza, cheese, ice cream, whole milk, fried foods, and gravies. Choose foods that are lower in fat and lean sources of protein. Use olive oil when cooking as it contains healthy fats that are not high in saturated fat.  Limit alcohol. Alcohol interferes with the elimination of uric acid from your body. If you are having a gout attack, avoid all alcohol.  Keep in mind that different people's bodies react differently to different foods. You will probably learn over time which foods do or do not affect you. If you discover that a food tends to cause your gout to flare up, avoid eating that food. You can more freely enjoy foods that do not cause problems. If you have any questions about a food item, talk to your dietitian or health care provider. Which foods are low, moderate, and high in purines? The following is a list of foods that are low, moderate, and high in purines. You can eat any amount of the foods that are low in purines. You may be able to have small amounts of foods that are moderate  in purines. Ask your health care provider how much of a food moderate in purines you can have. Avoid foods high in purines. Grains  Foods low in purines: Enriched white bread, pasta, rice, cake, cornbread, popcorn.  Foods moderate in purines: Whole-grain breads and cereals, wheat germ, bran, oatmeal. Uncooked oatmeal. Dry wheat bran or wheat germ.  Foods high in purines: Pancakes, [place] toast, biscuits, muffins. Vegetables  Foods low in purines: All vegetables, except those that are moderate in purines.  Foods moderate in purines: Asparagus, cauliflower, spinach, mushrooms, green peas. Fruits  All fruits are low in purines. Meats and other Protein Foods  Foods low in purines: Eggs, nuts, peanut butter.  Foods moderate in purines: 80-90% lean beef, lamb, veal, pork, poultry, fish, eggs, peanut butter, nuts. Crab, lobster, oysters, and shrimp. Cooked dried beans, peas, and lentils.  Foods high in purines: Anchovies, sardines, herring, mussels, tuna, codfish, scallops, trout, and haddock. Berniece Salines. Organ meats (such as liver or kidney). Tripe. Game meat. Goose. Sweetbreads. Dairy  All dairy foods are low in purines. Low-fat and fat-free dairy products are best because they are low in saturated fat. Beverages  Drinks low in purines: Water, carbonated beverages, tea, coffee, cocoa.  Drinks moderate in purines: Soft drinks and other drinks sweetened with high-fructose corn syrup. Juices. To find whether a food or drink is sweetened with high-fructose corn syrup, look at the  ingredients list.  Drinks high in purines: Alcoholic beverages (such as beer). Condiments  Foods low in purines: Salt, herbs, olives, pickles, relishes, vinegar.  Foods moderate in purines: Butter, margarine, oils, mayonnaise. Fats and Oils  Foods low in purines: All types, except gravies and sauces made with meat.  Foods high in purines: Gravies and sauces made with meat. Other Foods  Foods low in purines:  Sugars, sweets, gelatin. Cake. Soups made without meat.  Foods moderate in purines: Meat-based or fish-based soups, broths, or bouillons. Foods and drinks sweetened with high-fructose corn syrup.  Foods high in purines: High-fat desserts (such as ice cream, cookies, cakes, pies, doughnuts, and chocolate). Contact your dietitian for more information on foods that are not listed here. This information is not intended to replace advice given to you by your health care provider. Make sure you discuss any questions you have with your health care provider. Document Released: 09/14/2010 Document Revised: 10/26/2015 Document Reviewed: 04/26/2013 Elsevier Interactive Patient Education  2017 [employer].

## 2017-12-09 NOTE — Progress Notes (Signed)
Christopher Ingram , 11/23/1975, 42 y.o., male MRN: [id number] Patient Care Team    Relationship Specialty Notifications Start End  [name], DO PCP - General Family Medicine  01/03/16     Chief Complaint  Patient presents with  . Toe Pain    left great x 1 week     Subjective: Pt presents for an OV with complaints of left great toe pain of 1 week  duration.  Associated symptoms include redness and swelling. He denies injury. He is prescribed HCTZ and admits to decrease water consumption. He does not have history of gout.   Depression screen [facility] 2/9 12/09/2017 07/18/2017 04/07/2017 05/22/2016 01/24/2016  Decreased Interest 0 2 0 1 0  Down, Depressed, Hopeless 0 0 0 1 0  PHQ - 2 Score 0 2 0 2 0  Altered sleeping 0 2 1 2  -  Tired, decreased energy 0 1 - 2 -  Change in appetite 0 3 2 3  -  Feeling bad or failure about yourself  0 0 0 3 -  Trouble concentrating - 0 0 2 -  Moving slowly or fidgety/restless 0 0 0 1 -  Suicidal thoughts 0 0 0 0 -  PHQ-9 Score 0 8 3 15  -  Difficult doing work/chores - - Not difficult at all - -    Allergies  Allergen Reactions  . Aspirin     hives   Social History   Tobacco Use  . Smoking status: Never Smoker  . Smokeless tobacco: Never Used  Substance Use Topics  . Alcohol use: No   Past Medical History:  Diagnosis Date  . Allergy   . 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 Anxiety   . Asthma   . Chicken pox   . Depression, major   . GERD (gastroesophageal reflux disease)   . Hypertension   . Internal hemorrhoids    rectal bleeding, colonoscopy  . Migraine   . OSA on CPAP 04/2012   AHI23  . RAD (reactive airway disease)    ? asthma, normal studies, inhaler use  . Snoring   . 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 EYE SURGERY     lazy eye   Family History  Problem Relation Age of Onset  . Diabetes Mother   . Diabetes Father   .  Early death Father        heart attack 30  . Breast cancer Sister   . Liver cancer Sister   . Mental illness Brother    Allergies as of 12/09/2017      Reactions   Aspirin    hives      Medication List        Accurate as of 12/09/17  3:06 PM. Always use your most recent med list.          hydrochlorothiazide 25 MG tablet Commonly known as:  HYDRODIURIL Take 1 tablet (25 mg total) by mouth daily.   sertraline 100 MG tablet Commonly known as:  ZOLOFT Take 1 tablet (100 mg total) by mouth daily.       All past medical history, surgical history, allergies, family history, immunizations andmedications were updated in the EMR today and reviewed under the history and medication portions of their EMR.     ROS: Negative, with the exception of above mentioned in HPI   Objective:  BP 129/89 (BP  Location: Right Arm, Patient Position: Sitting, Cuff Size: Large)   Pulse 85   Temp 98.4 F (36.9 C)   Resp 20   Ht 6\' 3"  (1.905 m)   Wt (!) 368 lb (166.9 kg)   SpO2 97%   BMI 46.00 kg/m  Body mass index is 46 kg/m.  Gen: Afebrile. No acute distress.  HENT: AT. [place].MMM.  Eyes:Pupils Equal Round Reactive to light, Extraocular movements intact,  Conjunctiva without redness, discharge or icterus. CV: RRR  Chest: CTAB, no wheeze or crackles MSK: mild erythema and swelling left great toe. TTP proximal joint. Full ROM w/ Mild discomfort. Skin: no rashes, purpura or petechiae.    No exam data present No results found. No results found for this or any previous visit (from the past 24 hour(s)).  Assessment/Plan: Christopher Ingram is a 42 y.o. male present for OV for  Toe pain, left- possibly gout.  - treat with IM depo inj today and prednisone burst to start to tomorrow  - NSAIDS for pain. HYDRATE.  - discussed gout and low purine diet  - low purine diet discussed. He is on HCTZ- may need to change therapy.  - Uric acid level - hold on xray, if uric acid abnormal and responding to  steroids he will not need image.  - F/U 1 week PRN   Reviewed expectations re: course of current medical issues.  Discussed self-management of symptoms.  Outlined signs and symptoms indicating need for more acute intervention.  Patient verbalized understanding and all questions were answered.  Patient received an After-Visit Summary.    No orders of the defined types were placed in this encounter.    Note is dictated utilizing voice recognition software. Although note has been proof read prior to signing, occasional typographical errors still can be missed. If any questions arise, please do not hesitate to call for verification.   electronically signed by:  Howard Pouch, DO  [place]

## 2017-12-09 NOTE — Addendum Note (Signed)
Addended by: Leota Jacobsen on: 12/09/2017 03:38 PM   Modules accepted: Orders

## 2017-12-10 ENCOUNTER — Telehealth: Payer: Self-pay | Admitting: Family Medicine

## 2017-12-10 DIAGNOSIS — M79675 Pain in left toe(s): Secondary | ICD-10-CM

## 2017-12-10 LAB — URIC ACID: Uric Acid, Serum: 4.7 mg/dL (ref 4.0–8.0)

## 2017-12-10 NOTE — Telephone Encounter (Signed)
Please inform patient the following information: His uric acid was normal. Therefore, gout is less likely the cause of his pain. I have ordered an xray of his toe/foot to rule out other causes. Complete the steroid course, it will still be beneficial.  Xray order placed for [facility]. He can have it completed today at his convenience (after work etc).

## 2017-12-10 NOTE — Telephone Encounter (Signed)
Patient notified and verbalized understanding. 

## 2017-12-12 ENCOUNTER — Telehealth: Payer: Self-pay | Admitting: Family Medicine

## 2017-12-12 ENCOUNTER — Ambulatory Visit (HOSPITAL_BASED_OUTPATIENT_CLINIC_OR_DEPARTMENT_OTHER)
Admission: RE | Admit: 2017-12-12 | Discharge: 2017-12-12 | Disposition: A | Discharge status: Home / Self Care | Payer: Managed Care, Other (non HMO) | Source: Ambulatory Visit | Attending: Family Medicine | Admitting: Family Medicine

## 2017-12-12 DIAGNOSIS — M79675 Pain in left toe(s): Secondary | ICD-10-CM | POA: Insufficient documentation

## 2017-12-12 NOTE — Telephone Encounter (Signed)
Please inform patient the following information: His xray did not show evidence of fracture. Continue prednisone taper. If pain continues after completed, follow up in 2 weeks and we will investigate further.

## 2017-12-12 NOTE — Telephone Encounter (Signed)
Patient notified and verbalized understanding. 

## 2018-02-24 ENCOUNTER — Other Ambulatory Visit: Payer: Self-pay | Admitting: Family Medicine

## 2018-02-25 ENCOUNTER — Encounter: Payer: Self-pay | Admitting: *Deleted

## 2018-02-25 ENCOUNTER — Other Ambulatory Visit: Payer: Self-pay | Admitting: *Deleted

## 2018-02-25 MED ORDER — HYDROCHLOROTHIAZIDE 25 MG PO TABS: 25.0000 mg | ORAL_TABLET | Freq: Every day | ORAL | 0 refills | Status: DC

## 2018-02-25 NOTE — Telephone Encounter (Signed)
HCTZ refilled for 30 day supply patient needs office visit prior to any additional refills. Sent patient message in [facility] Chart.

## 2018-03-23 ENCOUNTER — Other Ambulatory Visit: Payer: Self-pay | Admitting: Family Medicine

## 2018-04-08 ENCOUNTER — Other Ambulatory Visit: Payer: Self-pay | Admitting: Family Medicine

## 2018-04-08 ENCOUNTER — Ambulatory Visit (INDEPENDENT_AMBULATORY_CARE_PROVIDER_SITE_OTHER): Payer: Managed Care, Other (non HMO) | Admitting: Family Medicine

## 2018-04-08 ENCOUNTER — Encounter: Payer: Self-pay | Admitting: Family Medicine

## 2018-04-08 VITALS — BP 139/89 | HR 83 | Temp 98.2°F | Resp 20 | Ht 75.0 in | Wt 379.2 lb

## 2018-04-08 DIAGNOSIS — Z9989 Dependence on other enabling machines and devices: Secondary | ICD-10-CM

## 2018-04-08 DIAGNOSIS — G4733 Obstructive sleep apnea (adult) (pediatric): Secondary | ICD-10-CM

## 2018-04-08 DIAGNOSIS — E781 Pure hyperglyceridemia: Secondary | ICD-10-CM

## 2018-04-08 DIAGNOSIS — I10 Essential (primary) hypertension: Secondary | ICD-10-CM

## 2018-04-08 DIAGNOSIS — J0100 Acute maxillary sinusitis, unspecified: Secondary | ICD-10-CM

## 2018-04-08 DIAGNOSIS — F418 Other specified anxiety disorders: Secondary | ICD-10-CM | POA: Diagnosis not present

## 2018-04-08 MED ORDER — HYDROCHLOROTHIAZIDE 25 MG PO TABS: 25.0000 mg | ORAL_TABLET | Freq: Every day | ORAL | 1 refills | Status: DC

## 2018-04-08 MED ORDER — SERTRALINE HCL 100 MG PO TABS: 100.0000 mg | ORAL_TABLET | Freq: Every day | ORAL | 1 refills | Status: DC

## 2018-04-08 MED ORDER — DOXYCYCLINE HYCLATE 100 MG PO TABS: 100.0000 mg | ORAL_TABLET | Freq: Two times a day (BID) | ORAL | 0 refills | Status: DC

## 2018-04-08 NOTE — Progress Notes (Signed)
Christopher Ingram , 05/25/1976, 42 y.o., male MRN: [id number] Patient Care Team    Relationship Specialty Notifications Start End  [name], DO PCP - General Family Medicine  01/03/16    Chief Complaint  Patient presents with  . URI    cough congestion x 1 week  . Depression  . Anxiety   Subjective:  Depression/anxiety: Patient reports compliance with Zoloft 100 mg daily and feels the medicine is working well. He is experiencing sexual side effects, but he states he is ok dealing with it since he is feeling better.  Patient states he was on prozac 20 mg and zoloft 50 mg at different times in the past. He felt the zoloft worked better for him. He reports he has trouble focusing with increased Anxiety. His current triggers surround his work life and financial issues at home. He would like to start back on the Zoloft medication.  Hypertension/morbid obesity/elevated tg : Pt reports compliance with his HCTZ 25 mg. He is also eating a low salt diet. He has not been exercising, but getting a treadmill. His job has him more sedentary as well. He has gained about 50 lbs over the last year. He does not eat much throughout the day and then over eats at night. He admits to feeling "jittery" when he does not eat. Patient denies chest pain, shortness of breath, dizziness or lower extremity edema.  Labs 07/2017 BMP: 07/2017 WNL CBC: 07/2017 WNL Lipid: 01/19/2016 C178, H49, L96, Tg164 TSH: 07/18/2017 Diet: low sodium Exercise: not routinely RF: HTN, HLD, obesity, FH HD (father MI 10)  OSA/CPAP: Old machine and study. He feels it is coated in bacteria. He is cleansing routinely.   Sinus pressure:  New problem. Started 1 week ago with cough, hoarseness, sore throat and sinus pressure, pain and congestion . Tried mucinex and dayquil. Denies fever or chills.   Mood disorder screen: negative 05/22/2016  Depression screen [facility] 2/9 04/08/2018 12/09/2017 07/18/2017  Decreased Interest 0 0 2  Down, Depressed,  Hopeless 0 0 0  PHQ - 2 Score 0 0 2  Altered sleeping 0 0 2  Tired, decreased energy 1 0 1  Change in appetite 0 0 3  Feeling bad or failure about yourself  0 0 0  Trouble concentrating 0 - 0  Moving slowly or fidgety/restless 0 0 0  Suicidal thoughts 0 0 0  PHQ-9 Score 1 0 8  Difficult doing work/chores Not difficult at all - -   GAD 7 : Generalized Anxiety Score 07/18/2017 05/22/2016  Nervous, Anxious, on Edge 0 3  Control/stop worrying 0 3  Worry too much - different things 0 2  Trouble relaxing 0 2  Restless 0 0  Easily annoyed or irritable 0 0  Afraid - awful might happen 0 3  Total GAD 7 Score 0 13  Anxiety Difficulty - Somewhat difficult    Allergies  Allergen Reactions  . Aspirin     hives   Social History   Tobacco Use  . Smoking status: Never Smoker  . Smokeless tobacco: Never Used  Substance Use Topics  . Alcohol use: No   Past Medical History:  Diagnosis Date  . Allergy   . 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 Anxiety   . Asthma   . Chicken pox   . Depression, major   . GERD (gastroesophageal reflux disease)   . Hypertension   . Internal hemorrhoids    rectal bleeding, colonoscopy  .  Migraine   . OSA on CPAP 04/2012   AHI23  . RAD (reactive airway disease)    ? asthma, normal studies, inhaler use  . Snoring   . 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 EYE SURGERY     lazy eye   Family History  Problem Relation Age of Onset  . Diabetes Mother   . Diabetes Father   . Early death Father        heart attack 24  . Breast cancer Sister   . Liver cancer Sister   . Mental illness Brother    Allergies as of 04/08/2018      Reactions   Aspirin    hives      Medication List        Accurate as of 04/08/18 10:16 AM. Always use your most recent med list.          hydrochlorothiazide 25 MG tablet Commonly known as:  HYDRODIURIL Take 1 tablet  (25 mg total) by mouth daily.   sertraline 100 MG tablet Commonly known as:  ZOLOFT Take 1 tablet (100 mg total) by mouth daily.       No results found for this or any previous visit (from the past 24 hour(s)). No results found.  ROS: Negative, with the exception of above mentioned in HPI  Objective:  BP 139/89 (BP Location: Left Arm, Patient Position: Sitting, Cuff Size: Large)   Pulse 83   Temp 98.2 F (36.8 C)   Resp 20   Ht 6\' 3"  (1.905 m)   Wt (!) 379 lb 4 oz (172 kg)   SpO2 95%   BMI 47.40 kg/m  Body mass index is 47.4 kg/m. Gen: Afebrile. No acute distress. Nontoxic. Obese caucasian male.  HENT: AT. [place]. Bilateral TM visualized and normal in appearance. MMM. Bilateral nares with erythema, swelling and drianage. Throat without erythema or exudates. Cough, hoarseness present and TTP max sinus.  Eyes:Pupils Equal Round Reactive to light, Extraocular movements intact,  Conjunctiva without redness, discharge or icterus. Neck/lymp/endocrine: Supple,no lymphadenopathy CV: RRR Chest: CTAB, no wheeze or crackles Abd: Soft. obese. NTND. BS present. no Masses palpated.  Skin: no rashes, purpura or petechiae.  Neuro: Normal gait. PERLA. EOMi. Alert. Oriented. Psych: Normal affect, dress and demeanor. Normal speech. Normal thought content and judgment.  Assessment/Plan: Christopher Ingram is a 42 y.o. male present for acute OV for increased anxiety. Anxiety with depression - stable. refills on zoloft.  - Doing well on zoloft 100 mg QD, refills provided for 6 months - f/u 6 months  Essential hypertension, benign/elevated Tg/morbid obesity/jittery - stable, refills  on HCTZ - continue HCTZ 25 mg qd - low salt diet. Exercise MORE. Discussed exercise, stand up desks, dietary modifications, calorie tracking in detail with patient today. He is not eating during the day, likely causing hypoglycemia with his reports of jitteriness. Encouraged small healthy meals throughout the day.  - if  sx continue despite those changes then he will need to follow up.  - f/u 6 months  OSA/CPAP: - very old study and machine. Needs update on both.  - referral to pulm  Sinusitis:  Rest, hydrate.  OTC: add flonase, mucinex DM if cough Doxycyline every 12 hours for 10 days prescribed, take until completed.  If cough present it can last up to 6-8 weeks.  F/U 2 weeks of not improved.    electronically signed by:  Joseph Art  Raoul Pitch, DO  [facility] - [place]

## 2018-04-08 NOTE — Patient Instructions (Addendum)
Sinusitis:  Rest, hydrate.  OTC: add flonase, mucinex DM if cough Doxycyline every 12 hours for 10 days prescribed, take until completed.  If cough present it can last up to 6-8 weeks.  F/U 2 weeks of not improved.    Refilled meds, called antibiotic, referred to pulmonology for CPAP.  Please help [place] help you:  We are honored you have chosen [facility] for your Primary Care home. Below you will find basic instructions that you may need to access in the future. Please help [place] help you by reading the instructions, which cover many of the frequent questions we experience.   Prescription refills and request:  -In order to allow more efficient response time, please call your pharmacy for all refills. They will forward the request electronically to [place]. This allows for the quickest possible response. Request left on a nurse line can take longer to refill, since these are checked as time allows between office patients and other phone calls.  - refill request can take up to 3-5 working days to complete.  - If request is sent electronically and request is appropiate, it is usually completed in 1-2 business days.  - all patients will need to be seen routinely for all chronic medical conditions requiring prescription medications (see follow-up below). If you are overdue for follow up on your condition, you will be asked to make an appointment and we will call in enough medication to cover you until your appointment (up to 30 days).  - all controlled substances will require a face to face visit to request/refill.  - if you desire your prescriptions to go through a new pharmacy, and have an active script at original pharmacy, you will need to call your pharmacy and have scripts transferred to new pharmacy. This is completed between the pharmacy locations and not by your provider.    Results: If any images or labs were ordered, it can take up to 1 week to get results depending on the test ordered and  the lab/facility running and resulting the test. - Normal or stable results, which do not need further discussion, may be released to your mychart immediately with attached note to you. A call may not be generated for normal results. Please make certain to sign up for mychart. If you have questions on how to activate your mychart you can call the front office.  - If your results need further discussion, our office will attempt to contact you via phone, and if unable to reach you after 2 attempts, we will release your abnormal result to your mychart with instructions.  - All results will be automatically released in mychart after 1 week.  - Your provider will provide you with explanation and instruction on all relevant material in your results. Please keep in mind, results and labs may appear confusing or abnormal to the untrained eye, but it does not mean they are actually abnormal for you personally. If you have any questions about your results that are not covered, or you desire more detailed explanation than what was provided, you should make an appointment with your provider to do so.   Our office handles many outgoing and incoming calls daily. If we have not contacted you within 1 week about your results, please check your mychart to see if there is a message first and if not, then contact our office.  In helping with this matter, you help decrease call volume, and therefore allow [place] to be able to respond  to patients needs more efficiently.   Acute office visits (sick visit):  An acute visit is intended for a new problem and are scheduled in shorter time slots to allow schedule openings for patients with new problems. This is the appropriate visit to discuss a new problem. Problems will not be addressed by phone call or Echart message. Appointment is needed if requesting treatment. In order to provide you with excellent quality medical care with proper time for you to explain your problem, have an exam  and receive treatment with instructions, these appointments should be limited to one new problem per visit. If you experience a new problem, in which you desire to be addressed, please make an acute office visit, we save openings on the schedule to accommodate you. Please do not save your new problem for any other type of visit, let us take care of it properly and quickly for you.   Follow up visits:  Depending on your condition(s) your provider will need to see you routinely in order to provide you with quality care and prescribe medication(s). Most chronic conditions (Example: hypertension, Diabetes, depression/anxiety... etc), require visits a couple times a year. Your provider will instruct you on proper follow up for your personal medical conditions and history. Please make certain to make follow up appointments for your condition as instructed. Failing to do so could result in lapse in your medication treatment/refills. If you request a refill, and are overdue to be seen on a condition, we will always provide you with a 30 day script (once) to allow you time to schedule.    Medicare wellness (well visit): - we have a wonderful Nurse Maudie Mercury), that will meet with you and provide you will yearly medicare wellness visits. These visits should occur yearly (can not be scheduled less than 1 calendar year apart) and cover preventive health, immunizations, advance directives and screenings you are entitled to yearly through your medicare benefits. Do not miss out on your entitled benefits, this is when medicare will pay for these benefits to be ordered for you.  These are strongly encouraged by your provider and is the appropriate type of visit to make certain you are up to date with all preventive health benefits. If you have not had your medicare wellness exam in the last 12 months, please make certain to schedule one by calling the office and schedule your medicare wellness with Maudie Mercury as soon as possible.    Yearly physical (well visit):  - Adults are recommended to be seen yearly for physicals. Check with your insurance and date of your last physical, most insurances require one calendar year between physicals. Physicals include all preventive health topics, screenings, medical exam and labs that are appropriate for gender/age and history. You may have fasting labs needed at this visit. This is a well visit (not a sick visit), new problems should not be covered during this visit (see acute visit).  - Pediatric patients are seen more frequently when they are younger. Your provider will advise you on well child visit timing that is appropriate for your their age. - This is not a medicare wellness visit. Medicare wellness exams do not have an exam portion to the visit. Some medicare companies allow for a physical, some do not allow a yearly physical. If your medicare allows a yearly physical you can schedule the medicare wellness with our nurse Maudie Mercury and have your physical with your provider after, on the same day. Please check with insurance for your full  benefits.   Late Policy/No Shows:  - all new patients should arrive 15-30 minutes earlier than appointment to allow [place] time  to  obtain all personal demographics,  insurance information and for you to complete office paperwork. - All established patients should arrive 10-15 minutes earlier than appointment time to update all information and be checked in .  - In our best efforts to run on time, if you are late for your appointment you will be asked to either reschedule or if able, we will work you back into the schedule. There will be a wait time to work you back in the schedule,  depending on availability.  - If you are unable to make it to your appointment as scheduled, please call 24 hours ahead of time to allow [place] to fill the time slot with someone else who needs to be seen. If you do not cancel your appointment ahead of time, you may be charged a no show  fee.

## 2018-04-08 NOTE — Telephone Encounter (Signed)
This was refilled this morning during patients office visit.

## 2018-04-22 ENCOUNTER — Institutional Professional Consult (permissible substitution): Payer: Managed Care, Other (non HMO) | Admitting: Pulmonary Disease

## 2018-05-18 ENCOUNTER — Encounter: Payer: Self-pay | Admitting: Pulmonary Disease

## 2018-05-18 ENCOUNTER — Ambulatory Visit (INDEPENDENT_AMBULATORY_CARE_PROVIDER_SITE_OTHER): Payer: Managed Care, Other (non HMO) | Admitting: Pulmonary Disease

## 2018-05-18 VITALS — BP 124/80 | HR 90 | Ht 75.0 in | Wt 380.0 lb

## 2018-05-18 DIAGNOSIS — Z9989 Dependence on other enabling machines and devices: Secondary | ICD-10-CM

## 2018-05-18 DIAGNOSIS — G4733 Obstructive sleep apnea (adult) (pediatric): Secondary | ICD-10-CM

## 2018-05-18 DIAGNOSIS — E669 Obesity, unspecified: Secondary | ICD-10-CM

## 2018-05-18 DIAGNOSIS — G4730 Sleep apnea, unspecified: Secondary | ICD-10-CM

## 2018-05-18 NOTE — Patient Instructions (Signed)
Will arrange for new auto CPAP machine   Will try to get copy of your previous sleep study from [place] or your doctor's office with [facility]  Follow up in 2 months

## 2018-05-18 NOTE — Progress Notes (Signed)
   Subjective:    Patient ID: Christopher Ingram, male    DOB: [birth year], 42 y.o.   MRN: [id number]  HPI    Review of Systems  Constitutional: Negative for fever and unexpected weight change.  HENT: Positive for congestion, nosebleeds, sinus pressure and sneezing. Negative for dental problem, ear pain, postnasal drip, rhinorrhea, sore throat and trouble swallowing.   Eyes: Negative for redness and itching.  Respiratory: Positive for shortness of breath. Negative for cough, chest tightness and wheezing.   Cardiovascular: Negative for palpitations and leg swelling.  Gastrointestinal: Negative for nausea and vomiting.  Genitourinary: Negative for dysuria.  Musculoskeletal: Negative for joint swelling.  Skin: Negative for rash.  Allergic/Immunologic: Positive for environmental allergies. Negative for food allergies and immunocompromised state.  Neurological: Positive for headaches.  Hematological: Does not bruise/bleed easily.  Psychiatric/Behavioral: Negative for dysphoric mood. The patient is not nervous/anxious.        Objective:   Physical Exam        Assessment & Plan:

## 2018-05-18 NOTE — Progress Notes (Signed)
[facility]  Chief Complaint  Patient presents with  . sleep consult    Pt needs to be re-established here at our office. Pt has OSA, and is in need of new cpap machine, his machine is over 44 yrs old.      Constitutional:  BP 124/80 (BP Location: Left Arm, Cuff Size: Normal)   Pulse 90   Ht 6\' 3"  (1.905 m)   Wt (!) 380 lb (172.4 kg)   SpO2 95%   BMI 47.50 kg/m   Past Medical History:  Migraine HA, HTN, GERD, Depression, Asthma, Anxiety, Allergies  Brief Summary:  Christopher Ingram is a 42 y.o. male with obstructive sleep apnea.  He had sleep study about 5 or 6 years ago with [facility].  Found to have sleep apnea.  Has been using CPAP since, and used [facility] for his DME.  He needs new machine.  He usually buys supplies on his own.  He has nasal pillows mask.  Occasional mouth dryness.  He goes to sleep at 11 pm.  He falls asleep in 20 minutes.  He wakes up some times to use the bathroom.  He gets out of bed at 1030 am.  He feels okay in the morning.  He denies morning headache.  He does not use anything to help him fall sleep.  Drinks coffee in the morning.  Has gained weight since original set up.  He denies sleep walking, sleep talking, bruxism, or nightmares.  There is no history of restless legs.  He denies sleep hallucinations, sleep paralysis, or cataplexy.  The Epworth score is 3 out of 24.     Physical Exam:   Appearance - well kempt   ENMT - clear nasal mucosa, midline nasal  septum, no oral exudates, no LAN, trachea midline  Respiratory - normal chest wall, normal respiratory effort, no accessory muscle use, no wheeze/rales  CV - s1s2 regular rate and rhythm, no murmurs, no peripheral edema, radial pulses symmetric  GI - soft, non tender, no masses  Lymph - no adenopathy noted in neck and axillary areas  MSK - normal gait  Ext - no cyanosis, clubbing, or joint inflammation noted  Skin - no rashes, lesions, or  ulcers  Neuro - normal strength, oriented x 3  Psych - normal mood and affect   Assessment/Plan:   Obstructive sleep apnea. - he is compliant with CPAP and reports benefit - his machine is more than 44 yrs old - will arrange for new auto CPAP - will try to get copy of his previous sleep study - advised that he might need repeat sleep study for insurance coverage of new machine >> should be able to do home sleep study  Obesity. - discussed how weight can impact sleep and risk for sleep disordered breathing - discussed options to assist with weight loss: combination of diet modification, cardiovascular and strength training exercises  Cardiovascular risk. - had an extensive discussion regarding the adverse health consequences related to untreated sleep disordered breathing - specifically discussed the risks for hypertension, coronary artery disease, cardiac dysrhythmias, cerebrovascular disease, and diabetes - lifestyle modification discussed  Safe driving practices. - discussed how sleep disruption can increase risk of accidents, particularly when driving - safe driving practices were discussed  Therapies for obstructive sleep apnea. - if the sleep study shows significant sleep apnea, then various therapies for treatment were reviewed: CPAP, oral appliance, and surgical interventions   Patient Instructions  Will arrange for new  auto CPAP machine   Will try to get copy of your previous sleep study from [place] or your doctor's office with [facility]  Follow up in 2 months    [name], MD [place] Pager: [phone] 05/18/2018, 3:56 PM  Flow Sheet    Sleep tests:  CPAP 04/18/18 to 05/17/18 >> used on 30 of 30 nights with average 9 hrs 43 min.  Average AHI 9.1 with CPAP 11 cm H2O.  Review of Systems:  Reflux, increased weight, sneezing, headaches, swelling, joint pain.  Remainder negative.  Medications:   Allergies as of 05/18/2018       Reactions   Aspirin    hives      Medication List       Accurate as of May 18, 2018  3:56 PM. Always use your most recent med list.        hydrochlorothiazide 25 MG tablet Commonly known as:  HYDRODIURIL Take 1 tablet (25 mg total) by mouth daily.   sertraline 100 MG tablet Commonly known as:  ZOLOFT Take 1 tablet (100 mg total) by mouth daily.       Past Surgical History:  He  has a past surgical history that includes Eye surgery.  Family History:  His family history includes Breast cancer in his sister; Diabetes in his father and mother; Early death in his father; Liver cancer in his sister; Mental illness in his brother.  Social History:  He  reports that he has never smoked. He has never used smokeless tobacco. He reports that he does not drink alcohol or use drugs.

## 2018-05-19 ENCOUNTER — Telehealth: Payer: Self-pay | Admitting: Pulmonary Disease

## 2018-05-19 DIAGNOSIS — Z9989 Dependence on other enabling machines and devices: Principal | ICD-10-CM

## 2018-05-19 DIAGNOSIS — G4733 Obstructive sleep apnea (adult) (pediatric): Secondary | ICD-10-CM

## 2018-05-20 NOTE — Telephone Encounter (Signed)
Routing to [place] for her to follow up on.

## 2018-05-20 NOTE — Telephone Encounter (Signed)
I returned patient's call.  I asked the patient to obtain a copy of his sleep study as well as the OV note where the sleep study was ordered.  I explained to pt that [facility] is requesting this to provide pt with a new cpap.  Pt verbalized understanding & requested to have a home sleep study ordered by [facility] while trying to obtain his previous sleep study.  Pt states he will have [facility] fax the old sleep study to my attention.  I let pt know I will ask VS to place on order for he home sleep study & once order is placed, pt will be contacted to schedule home sleep study in 2-3 wks.  Again pt verbalized understanding. I will forward to [facility] to help get this ordered.

## 2018-05-21 NOTE — Telephone Encounter (Signed)
Dr. Halford Chessman please advise if okay to order home sleep study. Thanks

## 2018-05-21 NOTE — Telephone Encounter (Signed)
Order placed for the home sleep study. Nothing further needed.

## 2018-05-21 NOTE — Telephone Encounter (Signed)
Okay to place order for home sleep study.

## 2018-06-15 ENCOUNTER — Telehealth: Payer: Self-pay

## 2018-06-15 DIAGNOSIS — G4733 Obstructive sleep apnea (adult) (pediatric): Secondary | ICD-10-CM

## 2018-06-15 DIAGNOSIS — Z9989 Dependence on other enabling machines and devices: Principal | ICD-10-CM

## 2018-06-15 NOTE — Telephone Encounter (Signed)
Spoke with [facility] in [facility] today about this message from VS below. She has all the documentation that I had on this patient in her office. Will f/u later this week with [facility].

## 2018-06-15 NOTE — Telephone Encounter (Signed)
-----   Message from [name], MD sent at 06/12/2018  8:54 AM EST ----- Golden Circle,  Christopher Ingram has been on CPAP and needed a new machine since his current machine is more than 45 yrs old.  I placed order for new CPAP with [facility].    [facility] contacted our office saying he needed a new sleep study before they would give him a new CPAP machine.  Medically, I didn't think he needed a new sleep study and was only ordering test for insurance coverage.  Can you check whether the new insurance Christopher Ingram has is requiring him to have a repeat sleep study prior to allowing for new CPAP machine.  If they are, then I can call to complete peer to peer review.  If his new insurance isn't requiring a new sleep study, then please make sure he gets a new CPAP machine from [facility] as ordered, and cancel order for home sleep study.  Thanks.  Vi

## 2018-06-17 DIAGNOSIS — Z9989 Dependence on other enabling machines and devices: Principal | ICD-10-CM

## 2018-06-17 DIAGNOSIS — G4733 Obstructive sleep apnea (adult) (pediatric): Secondary | ICD-10-CM

## 2018-06-17 NOTE — Telephone Encounter (Signed)
Dr. Sood - please advise. Thanks. 

## 2018-06-17 NOTE — Telephone Encounter (Signed)
Please get a copy of his download.  Once reviewed I will adjust CPAP settings.

## 2018-06-17 NOTE — Telephone Encounter (Signed)
Download has been printed. Will place in VS's look at folder for him to review. Routing message back to VS.

## 2018-06-22 DIAGNOSIS — G4733 Obstructive sleep apnea (adult) (pediatric): Secondary | ICD-10-CM | POA: Diagnosis not present

## 2018-06-22 NOTE — Telephone Encounter (Signed)
Christopher Ingram, have you heard back from patient's new insurance if new sleep study is required for new cpap machine? Please advise an update on below message. Thank you.  Routing message to [place] for update.

## 2018-06-22 NOTE — Telephone Encounter (Signed)
Christopher Ingram in [facility] states that VS will need to complete a peer to peer with [employer]. Paperwork is placed in VS green to do folder today to completed. Will f/u later in the week.  VS please advise once completed. Thank you.

## 2018-06-25 NOTE — Telephone Encounter (Signed)
Called and spoke with Ovid Curd with [employer] at phone [phone] In need of phone number to schedule for peer to peer  VS please call [phone] option 4 to schedule peer to peer for this patient. Thank you. Case number is not needed   Please advise if you need anything. Thank you.

## 2018-07-03 NOTE — Telephone Encounter (Signed)
Dr. Halford Chessman, please advise if peer-to-peer has been able to be done yet. Thanks!

## 2018-07-07 NOTE — Telephone Encounter (Signed)
LVM for patient to return call. X1 VS attempted to call [employer] for peer to peer on a denial with pt's insurance. Was told that Mr. Ing insurance coverage with [employer] expired on 06/01/2018. Need to find out which insurance provider he now has, update Epic,  and find out if he still needs prior authorization with whomever is his insurance provider.

## 2018-07-07 NOTE — Telephone Encounter (Signed)
I called listed number.  Spoke with representative from [facility].  Was told that Christopher Ingram insurance coverage with [employer] expired on 06/01/2018.  Can you find out which insurance provider he now has, update Epic, and find out if he still needs prior authorization with whomever is his insurance provider.

## 2018-07-08 NOTE — Telephone Encounter (Signed)
LVM for patient to return call. X2 VS attempted to call [employer] for peer to peer on a denial with pt's insurance. Was told that Mr. Dralle insurance coverage with [employer]ella [employer] expired on 06/01/2018. Need to find out which insurance provider he now has, update Epic,  and find out if he still needs prior authorization with whomever is his insurance provider.

## 2018-07-09 NOTE — Telephone Encounter (Signed)
Called and spoke with Payton in billing dept with [facility] at [phone] He states that the claims did go to [facility] for the new cpap machine Pt advised that he has the patient's new insurance info, and will send the claim to them today  Called and spoke with Jeannie Done with [employer] in customer service dept at [phone] She verified that patient insurance is now updated with [employer] and new claim has been sent this morning I asked Jeannie Done if patient has his new cpap machine; order was placed 05/18/18 She advised that he doesn't have it at this time, they need new order Order is placed this morning for auto CPAP machine.   His current device is more than 45 yrs old.  He uses [facility].   He needs auto CPAP 5 to 15 cm H2O with heated humidity.  Will f/u with [facility] next week to see if patient rec'd cpap machine.

## 2018-07-09 NOTE — Telephone Encounter (Signed)
LVM for patient  On mobile phone and home phone to return call. X3 VS attempted to call [employer] for peer to peer on a denial with pt's insurance. Was told that Christopher Ingram insurance coverage with [employer] expired on 06/01/2018. Need tofind out which insurance provider he now has, update Epic,  and find out if he still needs prior authorization with whomever is his insurance provider.

## 2018-07-09 NOTE — Telephone Encounter (Signed)
Called and spoke with Patient regarding new updated insurance information today Insurance info was updated today; Christopher Ingram is terminated, pt now has [employer] Patient was sent a new cpap machine to [employer] as his is over 45 yrs old With patient switching insurance companies, need to contact [facility] LVM for [employer] today at phone [phone] to see if claim can be sent to new insurance info LVM for [employer] main phone [phone] regarding cpap machine claim on patient.

## 2018-07-10 NOTE — Telephone Encounter (Signed)
Patient has rec'd new cpap machine on 07/02/2018 Nothing further needed.

## 2018-07-14 NOTE — Telephone Encounter (Signed)
Patient is concerned about the pressure setting on the new cpap machine VS advised to print DL for review; DL is printed today placed in his cubby for review VS is out of the office the rest of the afternoon today, will return tomorrow morning At that time he will be able to review DL for patient, and review if settings need to be changed.  VS please advise once you review the DL for the patient's cpap machine. Thank you.

## 2018-07-14 NOTE — Telephone Encounter (Signed)
CPAP 04/18/18 to 05/17/18 >> used on 30 of 30 nights with average 9 hrs 43 min.  Average AHI 9.1 with CPAP 11 cm H2O.   Now that insurance issue seems settle, please let him know that I have sent order to change his CPAP setting from 11 to 13 cm H2O. He should call back or email if he still feels like the pressure setting is not sufficient.

## 2018-07-16 NOTE — Telephone Encounter (Signed)
I sent order to have his pressure changed from CPAP 11 to 13 cm H2O on 07/14/18.  It looks like this order went through.  Please let him know and he should call if he has trouble after pressure change.

## 2018-07-23 DIAGNOSIS — G4733 Obstructive sleep apnea (adult) (pediatric): Secondary | ICD-10-CM | POA: Diagnosis not present

## 2018-08-13 ENCOUNTER — Ambulatory Visit: Payer: Self-pay | Admitting: *Deleted

## 2018-08-13 NOTE — Telephone Encounter (Signed)
Pt hung up/disconnected

## 2018-08-14 ENCOUNTER — Encounter: Payer: Self-pay | Admitting: Family Medicine

## 2018-08-14 ENCOUNTER — Other Ambulatory Visit: Payer: Self-pay

## 2018-08-14 ENCOUNTER — Ambulatory Visit: Payer: BLUE CROSS/BLUE SHIELD | Admitting: Family Medicine

## 2018-08-14 ENCOUNTER — Ambulatory Visit (INDEPENDENT_AMBULATORY_CARE_PROVIDER_SITE_OTHER): Payer: BLUE CROSS/BLUE SHIELD | Admitting: Family Medicine

## 2018-08-14 ENCOUNTER — Ambulatory Visit (HOSPITAL_BASED_OUTPATIENT_CLINIC_OR_DEPARTMENT_OTHER)
Admission: RE | Admit: 2018-08-14 | Discharge: 2018-08-14 | Disposition: A | Discharge status: Home / Self Care | Payer: BLUE CROSS/BLUE SHIELD | Source: Ambulatory Visit | Attending: Family Medicine | Admitting: Family Medicine

## 2018-08-14 VITALS — BP 121/79 | HR 81 | Temp 98.3°F | Resp 20 | Ht 75.0 in | Wt 381.2 lb

## 2018-08-14 DIAGNOSIS — I517 Cardiomegaly: Secondary | ICD-10-CM

## 2018-08-14 DIAGNOSIS — R0789 Other chest pain: Secondary | ICD-10-CM | POA: Diagnosis not present

## 2018-08-14 DIAGNOSIS — I10 Essential (primary) hypertension: Secondary | ICD-10-CM

## 2018-08-14 DIAGNOSIS — R0609 Other forms of dyspnea: Secondary | ICD-10-CM | POA: Diagnosis not present

## 2018-08-14 DIAGNOSIS — R9431 Abnormal electrocardiogram [ECG] [EKG]: Secondary | ICD-10-CM | POA: Diagnosis not present

## 2018-08-14 MED ORDER — ALBUTEROL SULFATE 108 (90 BASE) MCG/ACT IN AEPB: 2.0000 | INHALATION_SPRAY | RESPIRATORY_TRACT | 0 refills | Status: DC | PRN

## 2018-08-14 NOTE — Progress Notes (Signed)
OFFICE VISIT  08/14/2018   CC:  Chief Complaint  Patient presents with  . Shortness of Breath    x1 week     HPI:    Patient is a 43 y.o.  male with hx of asthma (?equivocal?) who presents for respiratory complaint. Onset about 10d/a, episodic feeling of chest tightness, severe heaviness on chest, discomfort that radiates around to mid/upper back on both sides.  Cough every now and then.  No fever.  No mucous production. 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 Says sx's became more severe and persistent 10d/a or so. When he stops and rests he feels lightheaded and fingers tingle.  Usually resolves w/in a minute.  Rare presyncopal feeling, but no syncope of fall.  Occ palpitations.  NO CHEST PAINS.  No nausea, diaphoresis, jaw pain, or left arm pain/paresthesias during these periods.  He states he is asthmatic and used to have an inhaler.  Greater than 5 yrs since he had to use albut. No hx of controller meds for asthma. No hx of any new meds recently. No nasal allergy sx's lately. Has OSA and uses CPAP machine.  He admits to signif anxiety/stressful work environment, ? Panic at times. He has sx's at home as well.   Hardly has any SOB or chest heaviness at rest.  He has HTN but doesn't check bp outside of medical office. Control of bp at MD visits while on hctz has been good.  No hx of thrombosis, no recent surgery, no recent prolonged immobilization, no FH of thrombosis.  ROS: no rashes, no melena/hematochezia.  No polyuria or polydipsia.   No focal weakness, no vision or hearing c/o, no tremors.   Past Medical History:  Diagnosis Date  . Allergy   . 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 Anxiety   . Asthma   . Chicken pox   . Depression, major   . GERD (gastroesophageal reflux disease)   . Hypertension   . Internal hemorrhoids    rectal bleeding, colonoscopy  . Migraine    . OSA on CPAP 04/2012   AHI23  . RAD (reactive airway disease)    ? asthma, normal studies, inhaler use  . Snoring   . 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 EYE SURGERY     lazy eye    Outpatient Medications Prior to Visit  Medication Sig Dispense Refill  . chlorhexidine (PERIDEX) 0.12 % solution     . hydrochlorothiazide (HYDRODIURIL) 25 MG tablet Take 1 tablet (25 mg total) by mouth daily. 90 tablet 1  . sertraline (ZOLOFT) 100 MG tablet Take 1 tablet (100 mg total) by mouth daily. 90 tablet 1   No facility-administered medications prior to visit.     Allergies  Allergen Reactions  . Aspirin     hives    ROS As per HPI  PE: Blood pressure 121/79, pulse 81, temperature 98.3 F (36.8 C), temperature source Oral, resp. rate 20, height 6\' 3"  (1.905 m), weight (!) 381 lb 3.2 oz (172.9 kg), SpO2 96 %. Body mass index is 47.65 kg/m.  Gen: Alert, well appearing.  Obese-appearing.  NAD.  Patient is oriented to person, place, time, and situation. AFFECT: pleasant, lucid thought and speech. CV: RRR, no m/r/g.   LUNGS: CTA bilat, nonlabored resps, good aeration in all  lung fields. No chest wall tenderness to palpation. Abd: soft, NT/ND EXT: no clubbing or cyanosis.  no edema.  Skin - no sores or suspicious lesions or rashes or color changes Neuro: CN 2-12 intact bilaterally, strength 5/5 in proximal and distal upper extremities and lower extremities bilaterally.  No tremor.    No ataxia.    LABS:  Lab Results  Component Value Date   WBC 6.7 07/18/2017   HGB 13.8 07/18/2017   HCT 41.3 07/18/2017   MCV 86.0 07/18/2017   PLT 335.0 07/18/2017     Chemistry      Component Value Date/Time   NA 138 07/18/2017 0913   K 3.5 07/18/2017 0913   CL 101 07/18/2017 0913   CO2 28 07/18/2017 0913   BUN 18 07/18/2017 0913   CREATININE 0.94 07/18/2017 0913   CREATININE 1.00 01/03/2016 1432      Component  Value Date/Time   CALCIUM 9.1 07/18/2017 0913   ALKPHOS 45 07/18/2017 0913   AST 15 07/18/2017 0913   ALT 22 07/18/2017 0913   BILITOT 0.4 07/18/2017 0913     12 lead EKG today: NSR, rate 75, RBBB, no ischemic changes, no ectopy.  LVH by aVL and I criteria.  Axis normal. Intervals and duration normal.  Compared to 06/05/2015 EKG RBBB is new, but QRS duration is shorter.  IMPRESSION AND PLAN:  1) Episodic dyspnea on exertion + chest pressure with this.  Getting more severe/persistent gradually.  Anxiety and obesity likely playing a big role in his symptoms However, full w/u indicated. EKG here today reassuring except LVH noted. Echocardiogram and lexiscan myoview ordered. CXR and PFTs ordered. ProAir respiclick prescribed today. No blood work indicated today.  I DO NOT think he is having any ACS at this time. Other than the albut inhaler, no new med rx'd today-->bp has been well controlled.  An After Visit Summary was printed and given to the patient.  FOLLOW UP: Return in about 2 weeks (around 08/28/2018) for 30 min f/u with Dr. Raoul Pitch (dyspnea on exertion).  Signed:  Crissie Sickles, MD           08/14/2018

## 2018-08-17 ENCOUNTER — Telehealth: Payer: Self-pay

## 2018-08-17 NOTE — Telephone Encounter (Signed)
-----   Message from Tammi Sou, MD sent at 08/14/2018  6:42 PM EDT ----- Chest x-ray normal.-thx

## 2018-08-18 ENCOUNTER — Encounter: Payer: BLUE CROSS/BLUE SHIELD | Admitting: Family Medicine

## 2018-08-21 DIAGNOSIS — G4733 Obstructive sleep apnea (adult) (pediatric): Secondary | ICD-10-CM | POA: Diagnosis not present

## 2018-09-03 ENCOUNTER — Telehealth: Payer: Self-pay | Admitting: Internal Medicine

## 2018-09-03 NOTE — Telephone Encounter (Signed)
Re: Echo on 09/09/2018  Attempted to reach patient at 3:15 pm to discuss postponing echocardiogram in light of COVID-19 pandemic, to limit our patient's exposure to medical facilities and the virus. Unable to connect, LMTCB.   If patient returns call please reschedule echocardiogram for 6-8 weeks and forward to [facility].

## 2018-09-09 ENCOUNTER — Encounter (HOSPITAL_COMMUNITY): Payer: BLUE CROSS/BLUE SHIELD

## 2018-09-09 ENCOUNTER — Ambulatory Visit (HOSPITAL_COMMUNITY): Payer: BLUE CROSS/BLUE SHIELD

## 2018-09-09 ENCOUNTER — Other Ambulatory Visit (HOSPITAL_COMMUNITY): Payer: BLUE CROSS/BLUE SHIELD

## 2018-09-10 ENCOUNTER — Ambulatory Visit (HOSPITAL_COMMUNITY): Payer: BLUE CROSS/BLUE SHIELD

## 2018-09-21 DIAGNOSIS — G4733 Obstructive sleep apnea (adult) (pediatric): Secondary | ICD-10-CM | POA: Diagnosis not present

## 2018-09-30 ENCOUNTER — Telehealth: Payer: Self-pay

## 2018-09-30 NOTE — Telephone Encounter (Signed)
Two faxed refill requests for HCTZ and Sertraline sent from [facility] [address]. Pt needs [facility] appt, last [place] was 04/08/2018 and was told to return in 6 months.

## 2018-10-07 ENCOUNTER — Encounter: Payer: Self-pay | Admitting: Family Medicine

## 2018-10-13 ENCOUNTER — Ambulatory Visit (INDEPENDENT_AMBULATORY_CARE_PROVIDER_SITE_OTHER): Payer: BLUE CROSS/BLUE SHIELD | Admitting: Family Medicine

## 2018-10-13 ENCOUNTER — Encounter: Payer: Self-pay | Admitting: Family Medicine

## 2018-10-13 VITALS — HR 74 | Ht 75.0 in

## 2018-10-13 DIAGNOSIS — I10 Essential (primary) hypertension: Secondary | ICD-10-CM | POA: Diagnosis not present

## 2018-10-13 DIAGNOSIS — Z9989 Dependence on other enabling machines and devices: Secondary | ICD-10-CM

## 2018-10-13 DIAGNOSIS — G4733 Obstructive sleep apnea (adult) (pediatric): Secondary | ICD-10-CM

## 2018-10-13 DIAGNOSIS — F418 Other specified anxiety disorders: Secondary | ICD-10-CM | POA: Diagnosis not present

## 2018-10-13 DIAGNOSIS — R7309 Other abnormal glucose: Secondary | ICD-10-CM

## 2018-10-13 DIAGNOSIS — R0600 Dyspnea, unspecified: Secondary | ICD-10-CM | POA: Insufficient documentation

## 2018-10-13 DIAGNOSIS — E781 Pure hyperglyceridemia: Secondary | ICD-10-CM

## 2018-10-13 DIAGNOSIS — R0609 Other forms of dyspnea: Secondary | ICD-10-CM | POA: Diagnosis not present

## 2018-10-13 MED ORDER — SERTRALINE HCL 100 MG PO TABS: 100.0000 mg | ORAL_TABLET | Freq: Every day | ORAL | 1 refills | Status: DC

## 2018-10-13 MED ORDER — HYDROCHLOROTHIAZIDE 25 MG PO TABS: 25.0000 mg | ORAL_TABLET | Freq: Every day | ORAL | 1 refills | Status: DC

## 2018-10-13 NOTE — Progress Notes (Signed)
VIRTUAL VISIT VIA VIDEO  I connected with Christopher Ingram on 10/13/18 at 10:20 AM EDT by a video enabled telemedicine application and verified that I am speaking with the correct person using two identifiers. Location patient: Home Location provider: [facility], Office Persons participating in the virtual visit: Patient, Dr. Raoul Pitch and R.Baker, LPN  I discussed the limitations of evaluation and management by telemedicine and the availability of in person appointments. The patient expressed understanding and agreed to proceed.   SUBJECTIVE Chief Complaint  Patient presents with  . Anxiety    Pt is doing well with medication and needs refills on medications   . Depression  . Hypertension    HPI:  Depression/anxiety: Patient reports  compliance with Zoloft 100 mg daily and feels the medicine is working very well for him.  He is experiencing sexual side effects, but he states he is ok dealing with it since he is feeling better.  Prior notes: Patient states he was on prozac 20 mg and zoloft 50 mg at different times in the past. He felt the zoloft worked better for him. He reports he has trouble focusing with increased Anxiety. His current triggers surround his work life and financial issues at home. He would like to start back on the Zoloft medication.  Hypertension/morbid obesity/elevated tg/elevated glucose : Pt reports  compliance with his HCTZ 25 mg. He is also eating a low salt diet.  His job has him more sedentary as well-especially now that he is working from home.  He does not eat much throughout the day and then over eats at night. He was seen for new dyspnea 08/2018. CXR normal. Did not have the echo completed d/t been canceled secondary to coronavirus outbreak. Patient denies chest pain, dizziness or lower extremity edema.  He does admit to continued dyspnea now of approximately 3 months when going up and down steps or walking long distance.  Was provided with an albuterol  inhaler which she states does help on occasions with his allergy-like shortness of breath.  Not his dyspnea on exertion.  He has not used his albuterol inhaler in 1 month. Labs 07/2017 BMP: 07/2017 WNL CBC: 07/2017 WNL Lipid: 01/19/2016 C178, H49, L96, Tg164 TSH: 07/18/2017 Diet: low sodium Exercise: not routinely RF: HTN, HLD, obesity, FH HD (father MI 42)  OSA/CPAP: Following with Dr. Halford Chessman.   CXR 08/14/2018: FINDINGS: The heart size and mediastinal contours are within normal limits. Both lungs are clear. The visualized skeletal structures are unremarkable.  IMPRESSION: No active cardiopulmonary disease. ROS: See pertinent positives and negatives per HPI.  Patient Active Problem List   Diagnosis Date Noted  . Hypertriglyceridemia 04/07/2017  . Anxiety with depression 05/22/2016  . Essential hypertension 01/03/2016  . OSA on CPAP 01/03/2016  . Morbid obesity ([place]) 01/03/2016    Social History   Tobacco Use  . Smoking status: Never Smoker  . Smokeless tobacco: Never Used  Substance Use Topics  . Alcohol use: No    Current Outpatient Medications:  .  Albuterol Sulfate (PROAIR RESPICLICK) 701 (90 Base) MCG/ACT AEPB, Inhale 2 puffs into the lungs every 4 (four) hours as needed., Disp: 1 each, Rfl: 0 .  cetirizine (ZYRTEC) 10 MG tablet, Take 10 mg by mouth daily., Disp: , Rfl:  .  hydrochlorothiazide (HYDRODIURIL) 25 MG tablet, Take 1 tablet (25 mg total) by mouth daily., Disp: 90 tablet, Rfl: 1 .  sertraline (ZOLOFT) 100 MG tablet, Take 1 tablet (100 mg total) by mouth daily., Disp:  90 tablet, Rfl: 1 .  chlorhexidine (PERIDEX) 0.12 % solution, , Disp: , Rfl:   Allergies  Allergen Reactions  . Aspirin     hives    OBJECTIVE: Pulse 74   Ht '6\' 3"'  (1.905 m)   BMI 47.65 kg/m  Gen: No acute distress. Nontoxic in appearance.  HENT: AT. [place].  MMM.  Eyes:Pupils Equal Round Reactive to light, Extraocular movements intact,  Conjunctiva without redness, discharge or icterus.  CV: no edema Chest: Cough or shortness of breath  Not present Skin: no  rashes, purpura or petechiae.  Neuro: . Alert. Oriented x3  Psych: Normal affect, dress and demeanor. Normal speech. Normal thought content and judgment. Depression screen [facility] 2/9 10/13/2018 04/08/2018 12/09/2017 07/18/2017 04/07/2017  Decreased Interest 0 0 0 2 0  Down, Depressed, Hopeless 0 0 0 0 0  PHQ - 2 Score 0 0 0 2 0  Altered sleeping 0 0 0 2 1  Tired, decreased energy 0 1 0 1 -  Change in appetite 2 0 0 3 2  Feeling bad or failure about yourself  0 0 0 0 0  Trouble concentrating 0 0 - 0 0  Moving slowly or fidgety/restless 0 0 0 0 0  Suicidal thoughts 0 0 0 0 0  PHQ-9 Score 2 1 0 8 3  Difficult doing work/chores Not difficult at all Not difficult at all - - Not difficult at all   GAD 7 : Generalized Anxiety Score 10/13/2018 07/18/2017 05/22/2016  Nervous, Anxious, on Edge 0 0 3  Control/stop worrying 0 0 3  Worry too much - different things 0 0 2  Trouble relaxing 0 0 2  Restless 0 0 0  Easily annoyed or irritable 0 0 0  Afraid - awful might happen 0 0 3  Total GAD 7 Score 0 0 13  Anxiety Difficulty Not difficult at all - Somewhat difficult     ASSESSMENT AND PLAN: Christopher Ingram is a 43 y.o. male present for  Anxiety with depression - stable. Refills provided today - sertraline (ZOLOFT) 100 MG tablet; Take 1 tablet (100 mg total) by mouth daily.  Dispense: 90 tablet; Refill: 1 - TSH; Future - f/u 6 mos  Essential hypertension/Hypertriglyceridemia/Morbid obesity (HCC)/OSA on CPAP/Dyspnea on exertion - BP has been stable at his appointment 2 months ago.  Scheduled for BP recheck with his labs.  Refilled HCTZ 25 mg daily for him.  Reordered his echocardiogram for his dyspnea on exertion which is still present 3 months later (first order canceled d/t coronavirus precautions). - hydrochlorothiazide (HYDRODIURIL) 25 MG tablet; Take 1 tablet (25 mg total) by mouth daily.  Dispense: 90 tablet; Refill: 1  -  CBC; Future - Comp Met (CMET); Future - TSH; Future - Lipid panel; Future - Vital signs - notify MD; Future - ECHOCARDIOGRAM COMPLETE; Future -Once echocardiogram results received consider cardiology referral for stress test. -Continue routine follow-ups with pulmonology for OSA.  Elevated glucose - diet and exercise modifications recommended.  - Hemoglobin A1c; Future  > 25 minutes spent with patient, >50% of time spent face to face counseling and coordinating care.   Follow up every 6 months on chronic conditions, or sooner if worsening.  Howard Pouch, DO 10/13/2018

## 2018-10-20 ENCOUNTER — Ambulatory Visit (INDEPENDENT_AMBULATORY_CARE_PROVIDER_SITE_OTHER): Payer: BLUE CROSS/BLUE SHIELD | Admitting: Family Medicine

## 2018-10-20 ENCOUNTER — Other Ambulatory Visit: Payer: Self-pay

## 2018-10-20 DIAGNOSIS — F418 Other specified anxiety disorders: Secondary | ICD-10-CM | POA: Diagnosis not present

## 2018-10-20 DIAGNOSIS — R7309 Other abnormal glucose: Secondary | ICD-10-CM | POA: Diagnosis not present

## 2018-10-20 DIAGNOSIS — E781 Pure hyperglyceridemia: Secondary | ICD-10-CM | POA: Diagnosis not present

## 2018-10-20 DIAGNOSIS — I10 Essential (primary) hypertension: Secondary | ICD-10-CM

## 2018-10-20 LAB — CBC
HCT: 41.4 % (ref 39.0–52.0)
Hemoglobin: 14 g/dL (ref 13.0–17.0)
MCHC: 33.8 g/dL (ref 30.0–36.0)
MCV: 84 fl (ref 78.0–100.0)
Platelets: 313 10*3/uL (ref 150.0–400.0)
RBC: 4.93 Mil/uL (ref 4.22–5.81)
RDW: 15 % (ref 11.5–15.5)
WBC: 7.2 10*3/uL (ref 4.0–10.5)

## 2018-10-20 LAB — COMPREHENSIVE METABOLIC PANEL
ALT: 18 U/L (ref 0–53)
AST: 13 U/L (ref 0–37)
Albumin: 4.1 g/dL (ref 3.5–5.2)
Alkaline Phosphatase: 53 U/L (ref 39–117)
BUN: 16 mg/dL (ref 6–23)
CO2: 27 mEq/L (ref 19–32)
Calcium: 9.1 mg/dL (ref 8.4–10.5)
Chloride: 99 mEq/L (ref 96–112)
Creatinine, Ser: 1.01 mg/dL (ref 0.40–1.50)
GFR: 79.95 mL/min (ref >60.00)
Glucose, Bld: 215 mg/dL — ABNORMAL HIGH (ref 70–99)
Potassium: 3.7 mEq/L (ref 3.5–5.1)
Sodium: 137 mEq/L (ref 135–145)
Total Bilirubin: 0.5 mg/dL (ref 0.2–1.2)
Total Protein: 7.2 g/dL (ref 6.0–8.3)

## 2018-10-20 LAB — HEMOGLOBIN A1C: Hgb A1c MFr Bld: 9.5 % — ABNORMAL HIGH (ref 4.6–6.5)

## 2018-10-20 LAB — TSH: TSH: 2.97 u[IU]/mL (ref 0.35–4.50)

## 2018-10-20 LAB — LIPID PANEL
Cholesterol: 217 mg/dL — ABNORMAL HIGH (ref 0–200)
HDL: 49.9 mg/dL (ref >39.00)
LDL Cholesterol: 129 mg/dL — ABNORMAL HIGH (ref 0–99)
NonHDL: 166.95
Total CHOL/HDL Ratio: 4
Triglycerides: 192 mg/dL — ABNORMAL HIGH (ref 0.0–149.0)
VLDL: 38.4 mg/dL (ref 0.0–40.0)

## 2018-10-20 NOTE — Progress Notes (Signed)
Patient advised.

## 2018-10-20 NOTE — Progress Notes (Addendum)
Christopher Ingram is a 43 y.o. male presents to the office today for Blood pressure recheck secondary to verbal orders and doxy.me visit. Blood pressure medication: HCTZ 25mg   If on medication, Last dose was at least 1-2 hours prior to recheck: Yes  Blood pressure was taken in the left arm after patient rested for 10 minutes.  BP 124/86 (BP Location: Left Arm, Patient Position: Sitting, Cuff Size: Large)   Pulse 91   [name],CMA ________________________________________________ His BP looks great.  Continue current regimen.  We will call him with labs once received.   Electronically Signed by: Howard Pouch, DO [facility]

## 2018-10-21 ENCOUNTER — Telehealth: Payer: Self-pay | Admitting: Family Medicine

## 2018-10-21 ENCOUNTER — Ambulatory Visit: Payer: BLUE CROSS/BLUE SHIELD

## 2018-10-21 DIAGNOSIS — E119 Type 2 diabetes mellitus without complications: Secondary | ICD-10-CM | POA: Insufficient documentation

## 2018-10-21 MED ORDER — SITAGLIP PHOS-METFORMIN HCL ER 100-1000 MG PO TB24: 1.0000 | ORAL_TABLET | Freq: Every day | ORAL | 1 refills | Status: DC

## 2018-10-21 NOTE — Telephone Encounter (Signed)
Pt was called and message was left to return call  

## 2018-10-21 NOTE — Telephone Encounter (Signed)
Please inform patient the following information: His labs are normal with the following exceptions- - His glucose was 215 and his a1c is 9.5. This means he now has diabetes. a1c goal is < 6.5.  - low carbohydrate/sugar diet recommended. If he is interested we can provide a referral to nutrition to guide him through a diabetic diet.  - We will need to start a medication daily for this condition. An a1c over 9 sometimes even requires insulin use. I have called in a oral  medication called Janumet to start daily with breakfast.  - We will eventually also have him start checking his fasting blood sugars in the morning. We can call in supplies today, or wait until his 3 mos follow up.   Diabetics are seen every 3 months initially and then every 4 mos once controlled. Please schedule him for his 3 mos (schedule this for 30 min).   Lastly, his cholesterol was mildly elevated.   I would like to discuss the above with him and provide him with some diabetes education and allow him to ask questions etc. Please set him up for a virtual visit ASAP. Thanks.

## 2018-10-22 MED ORDER — BLOOD GLUCOSE METER KIT: PACK | 11 refills | Status: DC

## 2018-10-22 NOTE — Telephone Encounter (Signed)
Pt was called and given all information. He would like to begin medication, refuses nutrition referral/virtual visit at this time but stated he will call back, would like to start taking fasting blood sugar (faxed to pharmacy). Pt was scheduled for 3 month F/U appt.

## 2018-10-22 NOTE — Addendum Note (Signed)
Addended by: Caroll Rancher L on: 10/22/2018 12:12 PM   Modules accepted: Orders

## 2018-10-27 ENCOUNTER — Other Ambulatory Visit: Payer: BLUE CROSS/BLUE SHIELD

## 2018-11-10 ENCOUNTER — Ambulatory Visit (INDEPENDENT_AMBULATORY_CARE_PROVIDER_SITE_OTHER): Payer: BLUE CROSS/BLUE SHIELD | Admitting: Family Medicine

## 2018-11-10 ENCOUNTER — Encounter: Payer: Self-pay | Admitting: Family Medicine

## 2018-11-10 ENCOUNTER — Telehealth: Payer: Self-pay | Admitting: Hematology

## 2018-11-10 ENCOUNTER — Other Ambulatory Visit: Payer: Self-pay

## 2018-11-10 ENCOUNTER — Other Ambulatory Visit: Payer: Self-pay | Admitting: *Deleted

## 2018-11-10 VITALS — Ht 75.0 in

## 2018-11-10 DIAGNOSIS — R0981 Nasal congestion: Secondary | ICD-10-CM | POA: Diagnosis not present

## 2018-11-10 DIAGNOSIS — M5416 Radiculopathy, lumbar region: Secondary | ICD-10-CM | POA: Diagnosis not present

## 2018-11-10 DIAGNOSIS — Z7189 Other specified counseling: Secondary | ICD-10-CM | POA: Diagnosis not present

## 2018-11-10 DIAGNOSIS — Z20822 Contact with and (suspected) exposure to covid-19: Secondary | ICD-10-CM

## 2018-11-10 MED ORDER — AMITRIPTYLINE HCL 10 MG PO TABS: ORAL_TABLET | ORAL | 0 refills | Status: DC

## 2018-11-10 NOTE — Progress Notes (Signed)
VIRTUAL VISIT VIA VIDEO  I connected with Christopher Ingram on 11/10/18 at  4:00 PM EDT by a video enabled telemedicine application and verified that I am speaking with the correct person using two identifiers. Location patient: Home Location provider: [facility], Office Persons participating in the virtual visit: Patient, Dr. Raoul Pitch and R.Baker, LPN  I discussed the limitations of evaluation and management by telemedicine and the availability of in person appointments. The patient expressed understanding and agreed to proceed.   SUBJECTIVE Chief Complaint  Patient presents with  . Leg Pain    Patient states that when he is laying down he is getting leg cramps  - from his waist down.  . covid testing    States that someone he works with was diagnosed with COVID.  He was in the office the day that they made the announcement.  He has no symptoms but is concerned and wondering if he should be tested.     HPI: Christopher Ingram is a 43 y.o. present for  Back pain: Patient reports he is having discomfort in his lower back that is creating pain in his anterior thighs when laying flat.  He states that the discomfort from the waist down.  He experienced this in the past and was placed on gabapentin and told it was secondary to his obesity.  He was unable to tolerate using low dose of gabapentin secondary to sedation.  He reports had resolved and only returned within the last week.  He has been recently diagnosed with diabetes and started on metformin.  He states he has been doing very well following a low sugar lower carbohydrate diet and has lost weight.  He denies any bladder or bowel dysfunction.  He does not recall if he has had an x-ray of his lumbar spine in the past.  No x-rays were available in EMR or care everywhere tab to review.  He reports a few years ago when this occurred it was mostly on his right side.  He states now it is mostly on his left side but does endorse bilateral discomfort.   COVID exposure:  Patient reports they were told of last week by email that one of his coworkers tested positive for COVID-19.  He denies fever, chills, cough, headache or shortness of breath.  He endorses fatigue and nasal congestion.  ROS: See pertinent positives and negatives per HPI.  Patient Active Problem List   Diagnosis Date Noted  . Diabetes mellitus, new onset ([place]) 10/21/2018  . Dyspnea on exertion 10/13/2018  . Elevated glucose 10/13/2018  . Hypertriglyceridemia 04/07/2017  . Anxiety with depression 05/22/2016  . Essential hypertension 01/03/2016  . OSA on CPAP 01/03/2016  . Morbid obesity ([place]) 01/03/2016    Social History   Tobacco Use  . Smoking status: Never Smoker  . Smokeless tobacco: Never Used  Substance Use Topics  . Alcohol use: No    Current Outpatient Medications:  .  ACCU-CHEK GUIDE test strip, USE TO CHECK FASTING BLOOD SUGAR DAILY, Disp: , Rfl:  .  Albuterol Sulfate (PROAIR RESPICLICK) 482 (90 Base) MCG/ACT AEPB, Inhale 2 puffs into the lungs every 4 (four) hours as needed., Disp: 1 each, Rfl: 0 .  blood glucose meter kit and supplies, Dispense based on patient and insurance preference. Use daily to check fasting blood glucose  (FOR ICD-10 E11.9)., Disp: 1 each, Rfl: 11 .  cetirizine (ZYRTEC) 10 MG tablet, Take 10 mg by mouth daily., Disp: , Rfl:  .  hydrochlorothiazide (HYDRODIURIL) 25 MG tablet, Take 1 tablet (25 mg total) by mouth daily., Disp: 90 tablet, Rfl: 1 .  sertraline (ZOLOFT) 100 MG tablet, Take 1 tablet (100 mg total) by mouth daily., Disp: 90 tablet, Rfl: 1 .  SitaGLIPtin-MetFORMIN HCl [phone] MG TB24, Take 1 tablet by mouth daily with breakfast., Disp: 90 tablet, Rfl: 1  Allergies  Allergen Reactions  . Aspirin     hives    OBJECTIVE: Ht _0  (1.905 m)   BMI 47.65 kg/m  Gen: No acute distress. Nontoxic in appearance.  HENT: AT. [place].  MMM.  Eyes:Pupils Equal Round Reactive to light, Extraocular movements intact,  Conjunctiva  without redness, discharge or icterus. Chest: Cough or shortness of breath not present Skin: No rashes, purpura or petechiae.  Neuro:  Normal gait. Alert. Oriented x3  Psych: Normal affect, dress and demeanor. Normal speech. Normal thought content and judgment.  ASSESSMENT AND PLAN: Christopher Ingram is a 43 y.o. male present for  Lumbar radiculopathy We will start evaluation with x-ray of lumbar spine.  Rule out evidence of potential lumbar stenosis with bilateral symptoms.  No red flags upon presentation. -Trial of Elavil nightly taper 10 mg - 30 mg.  Instructions provided to him on tapering 10 mg every 3 days if needed only. - DG Lumbar Spine Complete; Future -Follow-up plan will be discussed after x-rays results received  Educated About Covid-19 Virus Infection/ Nasal congestion Patient reports he did not have direct exposure with the employee but he did not have exposure to the working environment.  He like to be tested for COVID-19.  He was educated on COVID-19 virus.  He will be called with COVID-19 testing site and time.  > 25 minutes spent with patient, >50% of time spent face to face     Howard Pouch, DO 11/10/2018

## 2018-11-10 NOTE — Progress Notes (Signed)
I have discussed the procedure for the virtual visit with the patient who has given consent to proceed with assessment and treatment.   Briant Angelillo, CMA     

## 2018-11-10 NOTE — Telephone Encounter (Signed)
-----   Message from Caroll Rancher, LPN sent at 3/0/0511  4:02 PM EDT ----- Regarding: [id number] Test Covid-19 test  Christopher Ingram  DOB 29-Dec-1975 MRN [id number] [name]  Group # [id number]   Dr Howard Pouch

## 2018-11-10 NOTE — Telephone Encounter (Signed)
COVID testing scheduled and ordered. /

## 2018-11-11 ENCOUNTER — Ambulatory Visit (HOSPITAL_BASED_OUTPATIENT_CLINIC_OR_DEPARTMENT_OTHER)
Admission: RE | Admit: 2018-11-11 | Discharge: 2018-11-11 | Disposition: A | Discharge status: Home / Self Care | Payer: BLUE CROSS/BLUE SHIELD | Source: Ambulatory Visit | Attending: Family Medicine | Admitting: Family Medicine

## 2018-11-11 ENCOUNTER — Other Ambulatory Visit: Payer: Self-pay

## 2018-11-11 ENCOUNTER — Telehealth: Payer: Self-pay | Admitting: Family Medicine

## 2018-11-11 ENCOUNTER — Other Ambulatory Visit: Payer: BLUE CROSS/BLUE SHIELD

## 2018-11-11 DIAGNOSIS — M5416 Radiculopathy, lumbar region: Secondary | ICD-10-CM | POA: Diagnosis not present

## 2018-11-11 DIAGNOSIS — R6889 Other general symptoms and signs: Secondary | ICD-10-CM | POA: Diagnosis not present

## 2018-11-11 DIAGNOSIS — M5136 Other intervertebral disc degeneration, lumbar region: Secondary | ICD-10-CM | POA: Diagnosis not present

## 2018-11-11 DIAGNOSIS — Z20822 Contact with and (suspected) exposure to covid-19: Secondary | ICD-10-CM

## 2018-11-11 NOTE — Telephone Encounter (Signed)
Please inform patient the following information: Lumbar spine x-ray showed some mild degenerative changes and some bone spurring of his lower back.  He does appear to have some disc space narrowing around the level of that would provide innervation to the anterior thighs.  This could represent a disc protrusion at this level.  More advanced imaging would be needed to evaluate the discs and spinal cord.  Would recommend he try the medication we prescribed and if symptoms not resolved in 4 weeks or are worsening to follow-up and we will proceed with further evaluation

## 2018-11-12 LAB — NOVEL CORONAVIRUS, NAA: SARS-CoV-2, NAA: NOT DETECTED

## 2018-11-12 NOTE — Telephone Encounter (Signed)
Pt was called and give lab results, pt verbalized understanding

## 2018-12-02 ENCOUNTER — Encounter: Payer: Self-pay | Admitting: Family Medicine

## 2018-12-02 MED ORDER — ACCU-CHEK SOFT TOUCH LANCETS MISC: 0 refills | Status: DC

## 2018-12-02 NOTE — Telephone Encounter (Signed)
Please advise if we can change the RX to TID if not patient will have to buy lancets out of pocket until next refill date. Insurance still may not cover for checking TID since not on insulin.    Thanks

## 2018-12-02 NOTE — Telephone Encounter (Signed)
Called Pt and explained RX was sent until he could get refilled on 12/28/2018 but insurance may reject it. Lancets can be bought over the counter, pt made aware he could buy a box of lancets. RX sent to pharmacy.

## 2018-12-20 ENCOUNTER — Encounter: Payer: Self-pay | Admitting: Family Medicine

## 2019-01-12 ENCOUNTER — Ambulatory Visit: Payer: BLUE CROSS/BLUE SHIELD | Admitting: Family Medicine

## 2019-01-13 ENCOUNTER — Ambulatory Visit: Payer: BLUE CROSS/BLUE SHIELD | Admitting: Family Medicine

## 2019-01-20 ENCOUNTER — Encounter: Payer: Self-pay | Admitting: Family Medicine

## 2019-01-25 ENCOUNTER — Encounter: Payer: Self-pay | Admitting: Family Medicine

## 2019-01-26 ENCOUNTER — Other Ambulatory Visit: Payer: Self-pay

## 2019-01-26 ENCOUNTER — Ambulatory Visit (INDEPENDENT_AMBULATORY_CARE_PROVIDER_SITE_OTHER): Payer: BC Managed Care – PPO | Admitting: Family Medicine

## 2019-01-26 ENCOUNTER — Encounter: Payer: Self-pay | Admitting: Family Medicine

## 2019-01-26 VITALS — BP 138/83 | HR 69 | Temp 97.9°F | Resp 17 | Ht 75.0 in | Wt 365.0 lb

## 2019-01-26 DIAGNOSIS — E781 Pure hyperglyceridemia: Secondary | ICD-10-CM

## 2019-01-26 DIAGNOSIS — E119 Type 2 diabetes mellitus without complications: Secondary | ICD-10-CM | POA: Diagnosis not present

## 2019-01-26 DIAGNOSIS — I10 Essential (primary) hypertension: Secondary | ICD-10-CM

## 2019-01-26 DIAGNOSIS — F418 Other specified anxiety disorders: Secondary | ICD-10-CM | POA: Diagnosis not present

## 2019-01-26 DIAGNOSIS — Z23 Encounter for immunization: Secondary | ICD-10-CM

## 2019-01-26 LAB — POCT GLYCOSYLATED HEMOGLOBIN (HGB A1C)
HbA1c POC (<> result, manual entry): 5.8 % (ref 4.0–5.6)
HbA1c, POC (controlled diabetic range): 5.8 % (ref 0.0–7.0)
HbA1c, POC (prediabetic range): 5.8 % (ref 5.7–6.4)
Hemoglobin A1C: 5.8 % — AB (ref 4.0–5.6)

## 2019-01-26 MED ORDER — HYDROCHLOROTHIAZIDE 25 MG PO TABS: 25.0000 mg | ORAL_TABLET | Freq: Every day | ORAL | 1 refills | Status: DC

## 2019-01-26 MED ORDER — SERTRALINE HCL 100 MG PO TABS: 100.0000 mg | ORAL_TABLET | Freq: Every day | ORAL | 1 refills | Status: DC

## 2019-01-26 MED ORDER — METFORMIN HCL 1000 MG PO TABS: 1000.0000 mg | ORAL_TABLET | Freq: Two times a day (BID) | ORAL | 1 refills | Status: DC

## 2019-01-26 NOTE — Patient Instructions (Addendum)
Your a1c is 5.8 today! That is amazing.  Since the combo med is too expensive and you are doing so good on the diet>>> We will go back to just metformin twice a day>> I called this in for you.  Keep monitoring blood sugars and if you see a trend up over 120 (fasting) routinely>> call in and we will need to add another medication.  Follow up in 4 months.

## 2019-01-26 NOTE — Progress Notes (Signed)
Christopher Ingram , [birth year], 43 y.o., male MRN: [id number] Patient Care Team    Relationship Specialty Notifications Start End  [name], DO PCP - General Family Medicine  01/03/16    Chief Complaint  Patient presents with  . Depression    Pt is doing well with no complaints. Would like Flu vaccine.   . Hypertension  . Diabetes    Diabetes medication has went up in price- over $300/month. He has not taken x1 week, he is out of medication. He has lost weight and states sugars have been running great this week.    Subjective:  Depression/anxiety: Patient reports compliance with Zoloft 100 mg daily and feels the medicine is working great. He is experiencing sexual side effects, but he states he is ok dealing with it since he is feeling better.  Patient states he was on prozac 20 mg and zoloft 50 mg at different times in the past. He felt the zoloft worked better for him. He reports he has trouble focusing with increased Anxiety. His current triggers surround his work life and financial issues at home. He would like to start back on the Zoloft medication.  Hypertension/morbid obesity/elevated tg : Pt reports compliance with his HCTZ 25 mg. He is also eating a low salt diet. He has been exercising He and his wife has change their diets a great deal and both are losing weight. Patient denies chest pain, shortness of breath, dizziness or lower extremity edema.  BMP: 10/2018 - elevated glucose >200 CBC: 10/2018 WNL Lipid: 10/2018 LDL 129, tg 192 TSH: 10/2018 WNL Diet: low sodium, low carb Exercise: routinely RF: HTN, HLD, DM, obesity, FH HD (father MI 61)  Diabetes:  Diagnosed 10/2018 with an a1c of 9.5. He has made many changes over the last few months with his diet and exercise. He has lost 16 pounds. Pt reports compliance with janumet- unfortunately it is now too expensive. Denies numbness, tingling of extremities, hypo/hyperglycemic events or non-healing wounds. Pt reports BG ranges  100-135.  PNA series: pneumovax completed today Flu shot: completed today (recommneded yearly) A1c: 9.5>5.8 today!!  Mood disorder screen: negative 05/22/2016  Depression screen [facility] 2/9 10/13/2018 04/08/2018 12/09/2017  Decreased Interest 0 0 0  Down, Depressed, Hopeless 0 0 0  PHQ - 2 Score 0 0 0  Altered sleeping 0 0 0  Tired, decreased energy 0 1 0  Change in appetite 2 0 0  Feeling bad or failure about yourself  0 0 0  Trouble concentrating 0 0 -  Moving slowly or fidgety/restless 0 0 0  Suicidal thoughts 0 0 0  PHQ-9 Score 2 1 0  Difficult doing work/chores Not difficult at all Not difficult at all -   GAD 7 : Generalized Anxiety Score 10/13/2018 07/18/2017 05/22/2016  Nervous, Anxious, on Edge 0 0 3  Control/stop worrying 0 0 3  Worry too much - different things 0 0 2  Trouble relaxing 0 0 2  Restless 0 0 0  Easily annoyed or irritable 0 0 0  Afraid - awful might happen 0 0 3  Total GAD 7 Score 0 0 13  Anxiety Difficulty Not difficult at all - Somewhat difficult    Allergies  Allergen Reactions  . Aspirin     hives   Social History   Tobacco Use  . Smoking status: Never Smoker  . Smokeless tobacco: Never Used  Substance Use Topics  . Alcohol use: No   Past Medical History:  Diagnosis  Date  . Allergy   . 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 Anxiety   . Asthma   . Chicken pox   . Depression, major   . GERD (gastroesophageal reflux disease)   . Hypertension   . Internal hemorrhoids    rectal bleeding, colonoscopy  . Migraine   . OSA on CPAP 04/2012   AHI23  . RAD (reactive airway disease)    ? asthma, normal studies, inhaler use  . Snoring   . 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 EYE SURGERY     lazy eye   Family History  Problem Relation Age of Onset  . Diabetes Mother   . Diabetes Father   . Early death Father        heart attack 35  . Breast  cancer Sister   . Liver cancer Sister   . Mental illness Brother    Allergies as of 01/26/2019      Reactions   Aspirin    hives      Medication List       Accurate as of January 26, 2019 11:21 AM. If you have any questions, ask your nurse or doctor.        Accu-Chek Guide test strip Generic drug: glucose blood USE TO CHECK FASTING BLOOD SUGAR DAILY   accu-chek soft touch lancets Use to check sugars 3x daily ICD-10 E11.9   Albuterol Sulfate 108 (90 Base) MCG/ACT Aepb Commonly known as: ProAir RespiClick Inhale 2 puffs into the lungs every 4 (four) hours as needed.   amitriptyline 10 MG tablet Commonly known as: ELAVIL 1 tab PO QHS for 3 days, may increase by 10 mg every 3 days if needed only. Max dose 30 mg before follow up   blood glucose meter kit and supplies Dispense based on patient and insurance preference. Use daily to check fasting blood glucose  (FOR ICD-10 E11.9).   cetirizine 10 MG tablet Commonly known as: ZYRTEC Take 10 mg by mouth daily.   hydrochlorothiazide 25 MG tablet Commonly known as: HYDRODIURIL Take 1 tablet (25 mg total) by mouth daily.   sertraline 100 MG tablet Commonly known as: ZOLOFT Take 1 tablet (100 mg total) by mouth daily.   SitaGLIPtin-MetFORMIN HCl [id number] MG Tb24 Take 1 tablet by mouth daily with breakfast.       No results found for this or any previous visit (from the past 24 hour(s)). No results found.  ROS: Negative, with the exception of above mentioned in HPI  Objective:  BP 138/83 (BP Location: Left Arm, Patient Position: Sitting, Cuff Size: Normal)   Pulse 69   Temp 97.9 F (36.6 C) (Temporal)   Resp 17   Ht '6\' 3"'  (1.905 m)   Wt (!) 365 lb (165.6 kg)   SpO2 97%   BMI 45.62 kg/m  Body mass index is 45.62 kg/m. Gen: Afebrile. No acute distress. Nontoxic obese caucasian male.  HENT: AT. [place].. MMM.  Eyes:Pupils Equal Round Reactive to light, Extraocular movements intact,  Conjunctiva without redness,  discharge or icterus. Neck/lymp/endocrine: Supple,no lymphadenopathy, no thyromegaly CV: RRR no murmur, no edema, +2/4 P posterior tibialis pulses Chest: CTAB, no wheeze or crackles  Neuro:  Normal gait. PERLA. EOMi. Alert. Oriented.  Psych: Normal affect, dress and demeanor. Normal speech. Normal thought content and judgment..   Results for orders placed or performed in visit on 01/26/19 (from  the past 24 hour(s))  POCT glycosylated hemoglobin (Hb A1C)     Status: Abnormal   Collection Time: 01/26/19 11:24 AM  Result Value Ref Range   Hemoglobin A1C 5.8 (A) 4.0 - 5.6 %   HbA1c POC (<> result, manual entry) 5.8 4.0 - 5.6 %   HbA1c, POC (prediabetic range) 5.8 5.7 - 6.4 %   HbA1c, POC (controlled diabetic range) 5.8 0.0 - 7.0 %    Assessment/Plan: Eleuterio Dollar is a 43 y.o. male present for acute OV for increased anxiety. Anxiety with depression - stable. refills on zoloft.  - Doing well on zoloft 100 mg QD, refills provided for 6 months - f/u 4 months  Essential hypertension, benign/elevated Tg/morbid obesity - borderline BP, refills  on HCTZ 25 mg QD>>He will monitor and if not <130/80 next appt will add lisinopril. He is doing great on his diet- so hopefully it will return to normal - continue HCTZ 25 mg qd - low salt diet. - f/u 4 months  Diabetes mellitus, new onset ([place]) - did excellent with diet and exercise. Lost 16 lbs.  - a1c 9.5>5.8 today. unfortunately combo med is now too expensive. Since he is doing so well will use metformin alone and see if he can maintain control with all his dietary changes and exercise. - POCT glycosylated hemoglobin (Hb A1C) PNA series: pneumovax completed today Flu shot: completed today (recommneded yearly) Foot exam: complete next visit Eye exam: completed 06/2018 [place]: will collect next visit. A1c: 9.5>5.8 today!! F/u 4 mos Need for vaccine: Flu shot and pneumovax provided today  > 25 minutes spent with patient, >50% of time spent face  to face   electronically signed by:  Howard Pouch, DO  [place]

## 2019-01-26 NOTE — Addendum Note (Signed)
Addended by: Caroll Rancher L on: 01/26/2019 12:55 PM   Modules accepted: Orders

## 2019-02-11 ENCOUNTER — Encounter (HOSPITAL_COMMUNITY): Payer: Self-pay | Admitting: Family Medicine

## 2019-02-19 ENCOUNTER — Telehealth (HOSPITAL_COMMUNITY): Payer: Self-pay

## 2019-02-19 NOTE — Telephone Encounter (Signed)
New message    Just an FYI. We have made several attempts to contact this patient including sending a letter to schedule or reschedule their echocardiogram. We will be removing the patient from the echo WQ.   9.10.20 mail reminder letter Jari Sportsman  '8.7.20 @ 12:12pm lm on home vm - Aliha Diedrich  6.3.20 @ 3:33pm lm on home vm Talmage Nap # [phone]

## 2019-04-26 LAB — HM DIABETES EYE EXAM

## 2019-04-27 ENCOUNTER — Encounter: Payer: Self-pay | Admitting: Family Medicine

## 2019-04-27 ENCOUNTER — Ambulatory Visit (INDEPENDENT_AMBULATORY_CARE_PROVIDER_SITE_OTHER): Payer: BC Managed Care – PPO | Admitting: Family Medicine

## 2019-04-27 ENCOUNTER — Other Ambulatory Visit: Payer: Self-pay

## 2019-04-27 VITALS — BP 134/83 | HR 87 | Temp 98.5°F | Resp 18 | Ht 75.0 in | Wt 364.0 lb

## 2019-04-27 DIAGNOSIS — R82998 Other abnormal findings in urine: Secondary | ICD-10-CM | POA: Diagnosis not present

## 2019-04-27 LAB — POCT URINALYSIS DIPSTICK
Blood, UA: NEGATIVE
Glucose, UA: NEGATIVE
Ketones, UA: NEGATIVE
Leukocytes, UA: NEGATIVE
Nitrite, UA: NEGATIVE
Protein, UA: POSITIVE — AB
Spec Grav, UA: >=1.03 — AB (ref 1.010–1.025)
Urobilinogen, UA: 1 E.U./dL
pH, UA: 6 (ref 5.0–8.0)

## 2019-04-27 NOTE — Patient Instructions (Signed)
Increase water consumption 120 ounces a day.  Your urine should be almost clear- yellow.   We will call you with lab results once we get them and discuss if any further work up needed.    Dehydration, Adult  Dehydration is when there is not enough fluid or water in your body. This happens when you lose more fluids than you take in. Dehydration can range from mild to very bad. It should be treated right away to keep it from getting very bad. Symptoms of mild dehydration may include:  Thirst.  Dry lips.  Slightly dry mouth.  Dry, warm skin.  Dizziness. Symptoms of moderate dehydration may include:  Very dry mouth.  Muscle cramps.  Dark pee (urine). Pee may be the color of tea.  Your body making less pee.  Your eyes making fewer tears.  Heartbeat that is uneven or faster than normal (palpitations).  Headache.  Light-headedness, especially when you stand up from sitting.  Fainting (syncope). Symptoms of very bad dehydration may include:  Changes in skin, such as: ? Cold and clammy skin. ? Blotchy (mottled) or pale skin. ? Skin that does not quickly return to normal after being lightly pinched and let go (poor skin turgor).  Changes in body fluids, such as: ? Feeling very thirsty. ? Your eyes making fewer tears. ? Not sweating when body temperature is high, such as in hot weather. ? Your body making very little pee.  Changes in vital signs, such as: ? Weak pulse. ? Pulse that is more than 100 beats a minute when you are sitting still. ? Fast breathing. ? Low blood pressure.  Other changes, such as: ? Sunken eyes. ? Cold hands and feet. ? Confusion. ? Lack of energy (lethargy). ? Trouble waking up from sleep. ? Short-term weight loss. ? Unconsciousness. Follow these instructions at home:   If told by your doctor, drink an ORS: ? Make an ORS by using instructions on the package. ? Start by drinking small amounts, about  cup (120 mL) every 5-10  minutes. ? Slowly drink more until you have had the amount that your doctor said to have.  Drink enough clear fluid to keep your pee clear or pale yellow. If you were told to drink an ORS, finish the ORS first, then start slowly drinking clear fluids. Drink fluids such as: ? Water. Do not drink only water by itself. Doing that can make the salt (sodium) level in your body get too low (hyponatremia). ? Ice chips. ? Fruit juice that you have added water to (diluted). ? Low-calorie sports drinks.  Avoid: ? Alcohol. ? Drinks that have a lot of sugar. These include high-calorie sports drinks, fruit juice that does not have water added, and soda. ? Caffeine. ? Foods that are greasy or have a lot of fat or sugar.  Take over-the-counter and prescription medicines only as told by your doctor.  Do not take salt tablets. Doing that can make the salt level in your body get too high (hypernatremia).  Eat foods that have minerals (electrolytes). Examples include bananas, oranges, potatoes, tomatoes, and spinach.  Keep all follow-up visits as told by your doctor. This is important. Contact a doctor if:  You have belly (abdominal) pain that: ? Gets worse. ? Stays in one area (localizes).  You have a rash.  You have a stiff neck.  You get angry or annoyed more easily than normal (irritability).  You are more sleepy than normal.  You have a harder  time waking up than normal.  You feel: ? Weak. ? Dizzy. ? Very thirsty.  You have peed (urinated) only a small amount of very dark pee during 6-8 hours. Get help right away if:  You have symptoms of very bad dehydration.  You cannot drink fluids without throwing up (vomiting).  Your symptoms get worse with treatment.  You have a fever.  You have a very bad headache.  You are throwing up or having watery poop (diarrhea) and it: ? Gets worse. ? Does not go away.  You have blood or something green (bile) in your throw-up.  You have  blood in your poop (stool). This may cause poop to look black and tarry.  You have not peed in 6-8 hours.  You pass out (faint).  Your heart rate when you are sitting still is more than 100 beats a minute.  You have trouble breathing. This information is not intended to replace advice given to you by your health care provider. Make sure you discuss any questions you have with your health care provider. Document Released: 03/16/2009 Document Revised: 05/02/2017 Document Reviewed: 07/14/2015 Elsevier Patient Education  2020 [employer].

## 2019-04-27 NOTE — Progress Notes (Signed)
Christopher Ingram , [birth year], 43 y.o., male MRN: [id number] Patient Care Team    Relationship Specialty Notifications Start End  Christopher Hillock, DO PCP - General Family Medicine  01/03/16     Chief Complaint  Patient presents with  . Hematuria    Happened one time yesterday morning and was darker red/orange color. No pain, frequency, no fever.      Subjective: Pt presents for an OV with complaints of dark urine of 2 days duration.  Associated symptoms include mild discomfort left flank at time, that has resolved. He denies fever, chills, nausea, vomit or stool changes.  His is prescribed hctz 25 mg qd. He does experience edema if not prescribed.  Depression screen [facility] 2/9 10/13/2018 04/08/2018 12/09/2017 07/18/2017 04/07/2017  Decreased Interest 0 0 0 2 0  Down, Depressed, Hopeless 0 0 0 0 0  PHQ - 2 Score 0 0 0 2 0  Altered sleeping 0 0 0 2 1  Tired, decreased energy 0 1 0 1 -  Change in appetite 2 0 0 3 2  Feeling bad or failure about yourself  0 0 0 0 0  Trouble concentrating 0 0 - 0 0  Moving slowly or fidgety/restless 0 0 0 0 0  Suicidal thoughts 0 0 0 0 0  PHQ-9 Score 2 1 0 8 3  Difficult doing work/chores Not difficult at all Not difficult at all - - Not difficult at all    Allergies  Allergen Reactions  . Aspirin     hives   Social History   Social History Narrative   Married to [employer] [place]). Has one child named [name] (special needs).    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    Denies tobacco, drugs or etoh.    Drinks caffeine. Takes a daily vitamin.    Wears seatbelt. Smoke detector in the home.    Exercises routinely.    Feels safe in his relationships    Past Medical History:  Diagnosis Date  . Allergy   . 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 Anxiety   . Asthma   . Chicken pox   . Depression, major   . GERD (gastroesophageal reflux disease)   . Hypertension   . Internal hemorrhoids    rectal bleeding,  colonoscopy  . Migraine   . OSA on CPAP 04/2012   AHI23  . RAD (reactive airway disease)    ? asthma, normal studies, inhaler use  . Snoring   . 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 EYE SURGERY     lazy eye   Family History  Problem Relation Age of Onset  . Diabetes Mother   . Diabetes Father   . Early death Father        heart attack 41  . Breast cancer Sister   . Liver cancer Sister   . Mental illness Brother    Allergies as of 04/27/2019      Reactions   Aspirin    hives      Medication List       Accurate as of April 27, 2019  8:48 AM. If you have any questions, ask your nurse or doctor.        Accu-Chek Guide test strip Generic drug: glucose blood USE TO CHECK FASTING BLOOD SUGAR DAILY   accu-chek soft touch lancets Use to check  sugars 3x daily ICD-10 E11.9   Albuterol Sulfate 108 (90 Base) MCG/ACT Aepb Commonly known as: ProAir RespiClick Inhale 2 puffs into the lungs every 4 (four) hours as needed.   blood glucose meter kit and supplies Dispense based on patient and insurance preference. Use daily to check fasting blood glucose  (FOR ICD-10 E11.9).   cetirizine 10 MG tablet Commonly known as: ZYRTEC Take 10 mg by mouth daily.   hydrochlorothiazide 25 MG tablet Commonly known as: HYDRODIURIL Take 1 tablet (25 mg total) by mouth daily.   metFORMIN 1000 MG tablet Commonly known as: GLUCOPHAGE Take 1 tablet (1,000 mg total) by mouth 2 (two) times daily with a meal.   sertraline 100 MG tablet Commonly known as: ZOLOFT Take 1 tablet (100 mg total) by mouth daily.       All past medical history, surgical history, allergies, family history, immunizations andmedications were updated in the EMR today and reviewed under the history and medication portions of their EMR.     ROS: Negative, with the exception of above mentioned in HPI   Objective:  BP 134/83 (BP Location: Left  Arm, Patient Position: Sitting, Cuff Size: Normal)   Pulse 87   Temp 98.5 F (36.9 C) (Temporal)   Resp 18   Ht '6\' 3"'  (1.905 m)   Wt (!) 364 lb (165.1 kg)   SpO2 97%   BMI 45.50 kg/m  Body mass index is 45.5 kg/m. Gen: Afebrile. No acute distress. Nontoxic in appearance, well developed, well nourished.  HENT: AT. [place].  Eyes:Pupils Equal Round Reactive to light, Extraocular movements intact,  Conjunctiva without redness, discharge or icterus. CV: RRR Chest: CTAB, no wheeze or crackles.  Abd: Soft. NTND. BS present. MSK: no cva tenderness Skin: no rashes, purpura or petechiae.  Neuro:  Normal gait. PERLA. EOMi. Alert. Oriented x3   No exam data present No results found. Results for orders placed or performed in visit on 04/27/19 (from the past 24 hour(s))  POCT Urinalysis Dipstick     Status: Abnormal   Collection Time: 04/27/19  8:32 AM  Result Value Ref Range   Color, UA dark yellow    Clarity, UA clear    Glucose, UA Negative Negative   Bilirubin, UA 1+    Ketones, UA negative    Spec Grav, UA >=1.030 (A) 1.010 - 1.025   Blood, UA negative    pH, UA 6.0 5.0 - 8.0   Protein, UA Positive (A) Negative   Urobilinogen, UA 1.0 0.2 or 1.0 E.U./dL   Nitrite, UA negative    Leukocytes, UA Negative Negative   Appearance     Odor      Assessment/Plan: Christopher Ingram is a 43 y.o. male present for OV for  Amber-colored urine He has rather concentrated urine. Discussed increasing fluid intake- with goal at least 120 ounces. Light yellow urine color goal.  - will send urine for microanalysis and pt will be called with results.  - POCT Urinalysis Dipstick - Urinalysis with Culture Reflex   Reviewed expectations re: course of current medical issues.  Discussed self-management of symptoms.  Outlined signs and symptoms indicating need for more acute intervention.  Patient verbalized understanding and all questions were answered.  Patient received an After-Visit Summary.     Orders Placed This Encounter  Procedures  . Urinalysis with Culture Reflex  . POCT Urinalysis Dipstick   > 15 minutes spent with patient, > 50% of that time face to face  This visit occurred during  the SARS-CoV-2 public health emergency.  Safety protocols were in place, including screening questions prior to the visit, additional usage of staff PPE, and extensive cleaning of exam room while observing appropriate contact time as indicated for disinfecting solutions.     Note is dictated utilizing voice recognition software. Although note has been proof read prior to signing, occasional typographical errors still can be missed. If any questions arise, please do not hesitate to call for verification.   electronically signed by:  Howard Pouch, DO  [place]

## 2019-04-28 LAB — URINALYSIS W MICROSCOPIC + REFLEX CULTURE
Bacteria, UA: NONE SEEN /HPF
Bilirubin Urine: NEGATIVE
Glucose, UA: NEGATIVE
Hgb urine dipstick: NEGATIVE
Leukocyte Esterase: NEGATIVE
Nitrites, Initial: NEGATIVE
Specific Gravity, Urine: 1.026 (ref 1.001–1.03)
pH: 6 (ref 5.0–8.0)

## 2019-04-28 LAB — NO CULTURE INDICATED

## 2019-07-22 ENCOUNTER — Other Ambulatory Visit: Payer: Self-pay | Admitting: Family Medicine

## 2019-08-26 ENCOUNTER — Ambulatory Visit: Payer: BC Managed Care – PPO | Attending: Internal Medicine

## 2019-08-26 DIAGNOSIS — Z23 Encounter for immunization: Secondary | ICD-10-CM

## 2019-08-26 NOTE — Progress Notes (Signed)
   Covid-19 Vaccination Clinic  Name:  Christopher Ingram    MRN: [id number] DOB: 1975/06/08  08/26/2019  [name] was observed post Covid-19 immunization for 30 minutes based on pre-vaccination screening without incident. He was provided with Vaccine Information Sheet and instruction to access the V-Safe system.   [name] was instructed to call 911 with any severe reactions post vaccine: Marland Kitchen Difficulty breathing  . Swelling of face and throat  . A fast heartbeat  . A bad rash all over body  . Dizziness and weakness   Immunizations Administered    Name Date Dose VIS Date Route   Pfizer COVID-19 Vaccine 08/26/2019  2:27 PM 0.3 mL 05/14/2019 Intramuscular   Manufacturer: [place]   Lot: [id number]   [place]: [id number]

## 2019-09-20 ENCOUNTER — Ambulatory Visit: Payer: BC Managed Care – PPO | Attending: Internal Medicine

## 2019-09-20 DIAGNOSIS — Z23 Encounter for immunization: Secondary | ICD-10-CM

## 2019-09-20 NOTE — Progress Notes (Signed)
   Covid-19 Vaccination Clinic  Name:  Christopher Ingram    MRN: [id number] DOB: [birth year]  09/20/2019  Christopher Ingram was observed post Covid-19 immunization for 30 minutes based on pre-vaccination screening without incident. He was provided with Vaccine Information Sheet and instruction to access the V-Safe system.   Christopher Ingram was instructed to call 911 with any severe reactions post vaccine: Marland Kitchen Difficulty breathing  . Swelling of face and throat  . A fast heartbeat  . A bad rash all over body  . Dizziness and weakness   Immunizations Administered    Name Date Dose VIS Date Route   Pfizer COVID-19 Vaccine 09/20/2019  1:26 PM 0.3 mL 07/28/2018 Intramuscular   Manufacturer: [place]   Lot: [id number]   [place]: [id number]

## 2019-10-08 ENCOUNTER — Other Ambulatory Visit: Payer: Self-pay | Admitting: Family Medicine

## 2019-10-08 DIAGNOSIS — I10 Essential (primary) hypertension: Secondary | ICD-10-CM

## 2019-10-08 DIAGNOSIS — F418 Other specified anxiety disorders: Secondary | ICD-10-CM

## 2019-10-09 NOTE — Telephone Encounter (Signed)
Has appt with Dr Raoul Pitch 10/11/2019, will refill at appt

## 2019-10-11 ENCOUNTER — Ambulatory Visit (INDEPENDENT_AMBULATORY_CARE_PROVIDER_SITE_OTHER): Payer: BC Managed Care – PPO | Admitting: Family Medicine

## 2019-10-11 ENCOUNTER — Encounter: Payer: Self-pay | Admitting: Family Medicine

## 2019-10-11 ENCOUNTER — Other Ambulatory Visit: Payer: Self-pay

## 2019-10-11 VITALS — BP 140/97 | HR 98 | Temp 98.7°F | Resp 19 | Ht 75.0 in | Wt 363.5 lb

## 2019-10-11 DIAGNOSIS — F418 Other specified anxiety disorders: Secondary | ICD-10-CM

## 2019-10-11 DIAGNOSIS — E781 Pure hyperglyceridemia: Secondary | ICD-10-CM | POA: Diagnosis not present

## 2019-10-11 DIAGNOSIS — I10 Essential (primary) hypertension: Secondary | ICD-10-CM

## 2019-10-11 DIAGNOSIS — E119 Type 2 diabetes mellitus without complications: Secondary | ICD-10-CM | POA: Diagnosis not present

## 2019-10-11 LAB — POCT GLYCOSYLATED HEMOGLOBIN (HGB A1C)
HbA1c POC (<> result, manual entry): 5.8 % (ref 4.0–5.6)
HbA1c, POC (controlled diabetic range): 5.8 % (ref 0.0–7.0)
HbA1c, POC (prediabetic range): 5.8 % (ref 5.7–6.4)
Hemoglobin A1C: 5.8 % — AB (ref 4.0–5.6)

## 2019-10-11 LAB — MICROALBUMIN / CREATININE URINE RATIO
Creatinine,U: 129.4 mg/dL
Microalb Creat Ratio: 5.2 mg/g (ref 0.0–30.0)
Microalb, Ur: 6.7 mg/dL — ABNORMAL HIGH (ref 0.0–1.9)

## 2019-10-11 MED ORDER — METFORMIN HCL 1000 MG PO TABS: 1000.0000 mg | ORAL_TABLET | Freq: Every day | ORAL | 1 refills | Status: DC

## 2019-10-11 MED ORDER — HYDROCHLOROTHIAZIDE 25 MG PO TABS: 25.0000 mg | ORAL_TABLET | Freq: Every day | ORAL | 1 refills | Status: DC

## 2019-10-11 MED ORDER — LISINOPRIL 5 MG PO TABS: 5.0000 mg | ORAL_TABLET | Freq: Every day | ORAL | 1 refills | Status: DC

## 2019-10-11 MED ORDER — GABAPENTIN 400 MG PO CAPS: 400.0000 mg | ORAL_CAPSULE | Freq: Three times a day (TID) | ORAL | 1 refills | Status: DC

## 2019-10-11 MED ORDER — SERTRALINE HCL 100 MG PO TABS: 100.0000 mg | ORAL_TABLET | Freq: Every day | ORAL | 1 refills | Status: DC

## 2019-10-11 NOTE — Progress Notes (Signed)
Christopher Ingram , 1976-06-01, 44 y.o., male MRN: [id number] Patient Care Team    Relationship Specialty Notifications Start End  [name], DO PCP - General Family Medicine  01/03/16    Chief Complaint  Patient presents with  . Anxiety    Needs refills. Pt took BP meds around 7am.   . Diabetes  . Depression   Subjective: Christopher Ingram is a 44 y.o.  Depression/anxiety: Patient reports compliance with Zoloft 100 mg daily and feels the medicine is working great. He is experiencing sexual side effects, but he states he is ok dealing with it since he is feeling better.  Patient states he was on prozac 20 mg and zoloft 50 mg at different times in the past. He felt the zoloft worked better for him. He reports he has trouble focusing with increased Anxiety. His current triggers surround his work life and financial issues at home. He would like to start back on the Zoloft medication.  Hypertension/morbid obesity/elevated tg : Pt reports compliance with his HCTZ 25 mg. He is also eating a low salt diet. He has been exercising He and his wife has change their diets a great deal and both are losing weight. Patient denies chest pain, shortness of breath, dizziness or lower extremity edema.  BMP: 10/2018 - elevated glucose >200 CBC: 10/2018 WNL Lipid: 10/2018 LDL 129, tg 192 TSH: 10/2018 WNL Diet: low sodium, low carb Exercise: routinely RF: HTN, HLD, DM, obesity, FH HD (father MI 27)  Diabetes:  Diagnosed 10/2018 with an a1c of 9.5. He has made many changes over the last few months with his diet and exercise. He has lost 16 pounds. Pt reports compliance with janumet- unfortunately it is now too expensive. Denies numbness, tingling of extremities, hypo/hyperglycemic events or non-healing wounds. Pt reports BG ranges 100-135.  PNA series: pneumovax completed today Flu shot: completed today (recommneded yearly) A1c: 9.5>5.8 today!!  Back pain:  He is having some radicular signs again. He would like  to try the gabapentin again.  Prior note:  Patient reports he is having discomfort in his lower back that is creating pain in his anterior thighs when laying flat.  He states that the discomfort from the waist down.  He experienced this in the past and was placed on gabapentin and told it was secondary to his obesity.  He was unable to tolerate using low dose of gabapentin secondary to sedation.  He reports had resolved and only returned within the last week.  He has been recently diagnosed with diabetes and started on metformin.  He states he has been doing very well following a low sugar lower carbohydrate diet and has lost weight.  He denies any bladder or bowel dysfunction.  He does not recall if he has had an x-ray of his lumbar spine in the past.  No x-rays were available in EMR or care everywhere tab to review.  He reports a few years ago when this occurred it was mostly on his right side.  He states now it is mostly on his left side but does endorse bilateral discomfort.   Mood disorder screen: negative 05/22/2016  Depression screen [facility] 2/9 10/11/2019 10/13/2018 04/08/2018  Decreased Interest 0 0 0  Down, Depressed, Hopeless 0 0 0  PHQ - 2 Score 0 0 0  Altered sleeping 3 0 0  Tired, decreased energy 1 0 1  Change in appetite 0 2 0  Feeling bad or failure about yourself  0 0 0  Trouble concentrating 0 0 0  Moving slowly or fidgety/restless 0 0 0  Suicidal thoughts 0 0 0  PHQ-9 Score '4 2 1  ' Difficult doing work/chores Very difficult Not difficult at all Not difficult at all   GAD 7 : Generalized Anxiety Score 10/11/2019 10/13/2018 07/18/2017 05/22/2016  Nervous, Anxious, on Edge 0 0 0 3  Control/stop worrying 0 0 0 3  Worry too much - different things 2 0 0 2  Trouble relaxing 0 0 0 2  Restless 0 0 0 0  Easily annoyed or irritable 0 0 0 0  Afraid - awful might happen 0 0 0 3  Total GAD 7 Score 2 0 0 13  Anxiety Difficulty Not difficult at all Not difficult at all - Somewhat difficult      Allergies  Allergen Reactions  . Aspirin     hives   Social History   Tobacco Use  . Smoking status: Never Smoker  . Smokeless tobacco: Never Used  Substance Use Topics  . Alcohol use: No   Past Medical History:  Diagnosis Date  . Allergy   . 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 Anxiety   . Asthma   . Chicken pox   . Depression, major   . GERD (gastroesophageal reflux disease)   . Hypertension   . Internal hemorrhoids    rectal bleeding, colonoscopy  . Migraine   . OSA on CPAP 04/2012   AHI23  . RAD (reactive airway disease)    ? asthma, normal studies, inhaler use  . Snoring   . 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 EYE SURGERY     lazy eye   Family History  Problem Relation Age of Onset  . Diabetes Mother   . Diabetes Father   . Early death Father        heart attack 104  . Breast cancer Sister   . Liver cancer Sister   . Mental illness Brother    Allergies as of 10/11/2019      Reactions   Aspirin    hives      Medication List       Accurate as of Oct 11, 2019  1:38 PM. If you have any questions, ask your nurse or doctor.        Accu-Chek Guide test strip Generic drug: glucose blood USE TO CHECK FASTING BLOOD SUGAR DAILY   accu-chek soft touch lancets Use to check sugars 3x daily ICD-10 E11.9   Albuterol Sulfate 108 (90 Base) MCG/ACT Aepb Commonly known as: ProAir RespiClick Inhale 2 puffs into the lungs every 4 (four) hours as needed.   blood glucose meter kit and supplies Dispense based on patient and insurance preference. Use daily to check fasting blood glucose  (FOR ICD-10 E11.9).   cetirizine 10 MG tablet Commonly known as: ZYRTEC Take 10 mg by mouth daily.   gabapentin 400 MG capsule Commonly known as: Neurontin Take 1 capsule (400 mg total) by mouth 3 (three) times daily. Started by: Howard Pouch, DO   hydrochlorothiazide  25 MG tablet Commonly known as: HYDRODIURIL Take 1 tablet (25 mg total) by mouth daily.   lisinopril 5 MG tablet Commonly known as: ZESTRIL Take 1 tablet (5 mg total) by mouth daily. Started by: Howard Pouch, DO   metFORMIN 1000 MG tablet Commonly known as: GLUCOPHAGE Take 1 tablet (1,000 mg total) by  mouth daily with breakfast. What changed: when to take this Changed by: Howard Pouch, DO   sertraline 100 MG tablet Commonly known as: ZOLOFT Take 1 tablet (100 mg total) by mouth daily.       Results for orders placed or performed in visit on 10/11/19 (from the past 24 hour(s))  POCT glycosylated hemoglobin (Hb A1C)     Status: Abnormal   Collection Time: 10/11/19  1:18 PM  Result Value Ref Range   Hemoglobin A1C 5.8 (A) 4.0 - 5.6 %   HbA1c POC (<> result, manual entry) 5.8 4.0 - 5.6 %   HbA1c, POC (prediabetic range) 5.8 5.7 - 6.4 %   HbA1c, POC (controlled diabetic range) 5.8 0.0 - 7.0 %   No results found.  ROS: Negative, with the exception of above mentioned in HPI  Objective:  BP (!) 140/97 (BP Location: Left Arm, Patient Position: Sitting, Cuff Size: Large)   Pulse 98   Temp 98.7 F (37.1 C) (Temporal)   Resp 19   Ht '6\' 3"'  (1.905 m)   Wt (!) 363 lb 8 oz (164.9 kg)   SpO2 95%   BMI 45.43 kg/m  Body mass index is 45.43 kg/m. Gen: Afebrile. No acute distress. Nontoxic. Pleasant obese male.  HENT: AT. [place].  Eyes:Pupils Equal Round Reactive to light, Extraocular movements intact,  Conjunctiva without redness, discharge or icterus. Neck/lymp/endocrine: Supple,no lymphadenopathy, no thyromegaly CV: RRR no murmur, no edema, +2/4 P posterior tibialis pulses Chest: CTAB, no wheeze or crackles Skin: no rashes, purpura or petechiae.  Neuro:  Normal gait. PERLA. EOMi. Alert. Orientedx3 Psych: Normal affect, dress and demeanor. Normal speech. Normal thought content and judgment. Diabetic Foot Exam - Simple   Simple Foot Form Diabetic Foot exam was performed with the  following findings: Yes 10/11/2019  1:32 PM  Visual Inspection No deformities, no ulcerations, no other skin breakdown bilaterally: Yes Sensation Testing Intact to touch and monofilament testing bilaterally: Yes Pulse Check Posterior Tibialis and Dorsalis pulse intact bilaterally: Yes Comments      Results for orders placed or performed in visit on 10/11/19 (from the past 24 hour(s))  POCT glycosylated hemoglobin (Hb A1C)     Status: Abnormal   Collection Time: 10/11/19  1:18 PM  Result Value Ref Range   Hemoglobin A1C 5.8 (A) 4.0 - 5.6 %   HbA1c POC (<> result, manual entry) 5.8 4.0 - 5.6 %   HbA1c, POC (prediabetic range) 5.8 5.7 - 6.4 %   HbA1c, POC (controlled diabetic range) 5.8 0.0 - 7.0 %    Assessment/Plan: Christopher Ingram is a 44 y.o. male present for acute OV for increased anxiety. Anxiety with depression -  Stable.  - continue  zoloft 100 mg QD - f/u 6 months  Essential hypertension, benign/elevated Tg/morbid obesity - above goal of 130/80 - continue HCTZ 25 mg QD - add lisinopril 5 mg qd. - low salt diet. - f/u 6 months  Diabetes mellitus, new onset ([place]) - doing well. Continue the diet and weight loss - a1c 9.5>5.8>5.8 again today. - decrease metformin to 1000 mg QD PNA series: pneumovax completed  Flu shot: completed  (recommneded yearly) Foot exam: complete next visit Eye exam: completed 06/2018 [place]: will collect next visit. A1c: 9.5>5.8> 5.8 F/u 6 mos  Lumbar radiculopathy We will start evaluation with x-ray of lumbar spine.  Rule out evidence of potential lumbar stenosis with bilateral symptoms.  No red flags upon presentation. -Trial of Elavil; was not helpful.  - gabapentin  prescribed today- tapering instructions provided.    Orders Placed This Encounter  Procedures  . Comp Met (CMET)  . CBC  . Lipid panel  . TSH  . Urine Microalbumin w/creat. ratio  . POCT glycosylated hemoglobin (Hb A1C)   Meds ordered this encounter  Medications  .  sertraline (ZOLOFT) 100 MG tablet    Sig: Take 1 tablet (100 mg total) by mouth daily.    Dispense:  90 tablet    Refill:  1  . metFORMIN (GLUCOPHAGE) 1000 MG tablet    Sig: Take 1 tablet (1,000 mg total) by mouth daily with breakfast.    Dispense:  90 tablet    Refill:  1  . hydrochlorothiazide (HYDRODIURIL) 25 MG tablet    Sig: Take 1 tablet (25 mg total) by mouth daily.    Dispense:  90 tablet    Refill:  1  . lisinopril (ZESTRIL) 5 MG tablet    Sig: Take 1 tablet (5 mg total) by mouth daily.    Dispense:  90 tablet    Refill:  1  . gabapentin (NEURONTIN) 400 MG capsule    Sig: Take 1 capsule (400 mg total) by mouth 3 (three) times daily.    Dispense:  270 capsule    Refill:  1   Referral Orders  No referral(s) requested today    electronically signed by:  Howard Pouch, DO  [place]

## 2019-10-11 NOTE — Patient Instructions (Addendum)
I have refilled your medications for you.  We have added lisinopril 5 mg a day for your BP.   We decreased metformin to once a day.   All other meds the same .  F/u 6 mos.     Diabetes Mellitus and Exercise Exercising regularly is important for your overall health, especially when you have diabetes (diabetes mellitus). Exercising is not only about losing weight. It has many other health benefits, such as increasing muscle strength and bone density and reducing body fat and stress. This leads to improved fitness, flexibility, and endurance, all of which result in better overall health. Exercise has additional benefits for people with diabetes, including:  Reducing appetite.  Helping to lower and control blood glucose.  Lowering blood pressure.  Helping to control amounts of fatty substances (lipids) in the blood, such as cholesterol and triglycerides.  Helping the body to respond better to insulin (improving insulin sensitivity).  Reducing how much insulin the body needs.  Decreasing the risk for heart disease by: ? Lowering cholesterol and triglyceride levels. ? Increasing the levels of good cholesterol. ? Lowering blood glucose levels. What is my activity plan? Your health care provider or certified diabetes educator can help you make a plan for the type and frequency of exercise (activity plan) that works for you. Make sure that you:  Do at least 150 minutes of moderate-intensity or vigorous-intensity exercise each week. This could be brisk walking, biking, or water aerobics. ? Do stretching and strength exercises, such as yoga or weightlifting, at least 2 times a week. ? Spread out your activity over at least 3 days of the week.  Get some form of physical activity every day. ? Do not go more than 2 days in a row without some kind of physical activity. ? Avoid being inactive for more than 30 minutes at a time. Take frequent breaks to walk or stretch.  Choose a type of  exercise or activity that you enjoy, and set realistic goals.  Start slowly, and gradually increase the intensity of your exercise over time. What do I need to know about managing my diabetes?   Check your blood glucose before and after exercising. ? If your blood glucose is 240 mg/dL (13.3 mmol/L) or higher before you exercise, check your urine for ketones. If you have ketones in your urine, do not exercise until your blood glucose returns to normal. ? If your blood glucose is 100 mg/dL (5.6 mmol/L) or lower, eat a snack containing 15-20 grams of carbohydrate. Check your blood glucose 15 minutes after the snack to make sure that your level is above 100 mg/dL (5.6 mmol/L) before you start your exercise.  Know the symptoms of low blood glucose (hypoglycemia) and how to treat it. Your risk for hypoglycemia increases during and after exercise. Common symptoms of hypoglycemia can include: ? Hunger. ? Anxiety. ? Sweating and feeling clammy. ? Confusion. ? Dizziness or feeling light-headed. ? Increased heart rate or palpitations. ? Blurry vision. ? Tingling or numbness around the mouth, lips, or tongue. ? Tremors or shakes. ? Irritability.  Keep a rapid-acting carbohydrate snack available before, during, and after exercise to help prevent or treat hypoglycemia.  Avoid injecting insulin into areas of the body that are going to be exercised. For example, avoid injecting insulin into: ? The arms, when playing tennis. ? The legs, when jogging.  Keep records of your exercise habits. Doing this can help you and your health care provider adjust your diabetes management plan  as needed. Write down: ? Food that you eat before and after you exercise. ? Blood glucose levels before and after you exercise. ? The type and amount of exercise you have done. ? When your insulin is expected to peak, if you use insulin. Avoid exercising at times when your insulin is peaking.  When you start a new exercise or  activity, work with your health care provider to make sure the activity is safe for you, and to adjust your insulin, medicines, or food intake as needed.  Drink plenty of water while you exercise to prevent dehydration or heat stroke. Drink enough fluid to keep your urine clear or pale yellow. Summary  Exercising regularly is important for your overall health, especially when you have diabetes (diabetes mellitus).  Exercising has many health benefits, such as increasing muscle strength and bone density and reducing body fat and stress.  Your health care provider or certified diabetes educator can help you make a plan for the type and frequency of exercise (activity plan) that works for you.  When you start a new exercise or activity, work with your health care provider to make sure the activity is safe for you, and to adjust your insulin, medicines, or food intake as needed. This information is not intended to replace advice given to you by your health care provider. Make sure you discuss any questions you have with your health care provider. Document Revised: 12/12/2016 Document Reviewed: 10/30/2015 Elsevier Patient Education  [place].

## 2019-10-12 ENCOUNTER — Telehealth: Payer: Self-pay | Admitting: Family Medicine

## 2019-10-12 ENCOUNTER — Telehealth: Payer: Self-pay

## 2019-10-12 DIAGNOSIS — E785 Hyperlipidemia, unspecified: Secondary | ICD-10-CM | POA: Insufficient documentation

## 2019-10-12 DIAGNOSIS — E1169 Type 2 diabetes mellitus with other specified complication: Secondary | ICD-10-CM | POA: Insufficient documentation

## 2019-10-12 LAB — COMPREHENSIVE METABOLIC PANEL
AG Ratio: 1.4 (calc) (ref 1.0–2.5)
ALT: 27 U/L (ref 9–46)
AST: 19 U/L (ref 10–40)
Albumin: 4.3 g/dL (ref 3.6–5.1)
Alkaline phosphatase (APISO): 48 U/L (ref 36–130)
BUN: 19 mg/dL (ref 7–25)
CO2: 24 mmol/L (ref 20–32)
Calcium: 9.6 mg/dL (ref 8.6–10.3)
Chloride: 100 mmol/L (ref 98–110)
Creat: 1.03 mg/dL (ref 0.60–1.35)
Globulin: 3 g/dL (calc) (ref 1.9–3.7)
Glucose, Bld: 126 mg/dL — ABNORMAL HIGH (ref 65–99)
Potassium: 3.8 mmol/L (ref 3.5–5.3)
Sodium: 140 mmol/L (ref 135–146)
Total Bilirubin: 0.3 mg/dL (ref 0.2–1.2)
Total Protein: 7.3 g/dL (ref 6.1–8.1)

## 2019-10-12 LAB — CBC
HCT: 42.2 % (ref 38.5–50.0)
Hemoglobin: 13.9 g/dL (ref 13.2–17.1)
MCH: 27.7 pg (ref 27.0–33.0)
MCHC: 32.9 g/dL (ref 32.0–36.0)
MCV: 84.1 fL (ref 80.0–100.0)
MPV: 11 fL (ref 7.5–12.5)
Platelets: 390 10*3/uL (ref 140–400)
RBC: 5.02 10*6/uL (ref 4.20–5.80)
RDW: 14.4 % (ref 11.0–15.0)
WBC: 11.6 10*3/uL — ABNORMAL HIGH (ref 3.8–10.8)

## 2019-10-12 LAB — LIPID PANEL
Cholesterol: 224 mg/dL — ABNORMAL HIGH (ref <200)
HDL: 52 mg/dL (ref >=40)
LDL Cholesterol (Calc): 129 mg/dL (calc) — ABNORMAL HIGH
Non-HDL Cholesterol (Calc): 172 mg/dL (calc) — ABNORMAL HIGH (ref <130)
Total CHOL/HDL Ratio: 4.3 (calc) (ref <5.0)
Triglycerides: 281 mg/dL — ABNORMAL HIGH (ref <150)

## 2019-10-12 LAB — TSH: TSH: 1.53 mIU/L (ref 0.40–4.50)

## 2019-10-12 MED ORDER — ATORVASTATIN CALCIUM 20 MG PO TABS: 20.0000 mg | ORAL_TABLET | Freq: Every day | ORAL | 3 refills | Status: DC

## 2019-10-12 NOTE — Telephone Encounter (Signed)
Patient called in regarding some medications he picked up yesterday at pharmacy would like a call back from nurse    atorvastatin (LIPITOR) 20 MG tablet

## 2019-10-12 NOTE — Telephone Encounter (Signed)
Pt was called and given all information and lab results, he verbalized understanding, 3 month appt scheduled

## 2019-10-12 NOTE — Telephone Encounter (Signed)
Please inform patient the following information: Liver, kidney and thyroid function is normal White blood cell count was mildly elevated.  This could be a mild acute illness.  Again very mild elevation- no concerns.  More FYI in case he goes on to develop symptoms of illness. Urinalysis resulted with elevated protein.  This is most likely caused from his blood pressure being over goal.  2 most common causes for this are uncontrolled blood pressure or diabetes, and his diabetes is very well managed.  Controlling the blood pressure and the addition of the lisinopril (which is kidney protective) is the treatment for protein in the urine.  Lastly, his cholesterol is above goal given his diagnosis of hypertension and diabetes his goal is less than 100 LDL.  His total cholesterol 224, LDL 129, HDL 52 and triglycerides are elevated at 281 (nl [id number] fasting-uncertain if he was fasting).  By criteria of the diabetes foundation and the American heart foundation, it is recommended he start a statin medicine to not only provide him cardiovascular protection but help bring down his cholesterol levels.  I have called this in for him, is the same medicine his wife is prescribed.  Originally we set follow-up for 6 months.  I would recommend he keep that appointment but have an additional 50-month follow-up to ensure blood pressure is in normal range after start of lisinopril, his WBCs returned to normal and check his cholesterol levels on medication.

## 2019-10-12 NOTE — Telephone Encounter (Signed)
See lab results phone note- pt notified

## 2019-10-13 ENCOUNTER — Encounter: Payer: Self-pay | Admitting: Family Medicine

## 2020-01-10 ENCOUNTER — Ambulatory Visit: Payer: BC Managed Care – PPO | Admitting: Family Medicine

## 2020-01-17 ENCOUNTER — Other Ambulatory Visit: Payer: Self-pay

## 2020-01-17 ENCOUNTER — Encounter: Payer: Self-pay | Admitting: Family Medicine

## 2020-01-17 ENCOUNTER — Ambulatory Visit (INDEPENDENT_AMBULATORY_CARE_PROVIDER_SITE_OTHER): Payer: BC Managed Care – PPO | Admitting: Family Medicine

## 2020-01-17 VITALS — BP 130/86 | HR 79 | Temp 98.2°F | Resp 16 | Wt 370.2 lb

## 2020-01-17 DIAGNOSIS — I10 Essential (primary) hypertension: Secondary | ICD-10-CM

## 2020-01-17 DIAGNOSIS — E781 Pure hyperglyceridemia: Secondary | ICD-10-CM

## 2020-01-17 DIAGNOSIS — E119 Type 2 diabetes mellitus without complications: Secondary | ICD-10-CM | POA: Diagnosis not present

## 2020-01-17 DIAGNOSIS — E1169 Type 2 diabetes mellitus with other specified complication: Secondary | ICD-10-CM

## 2020-01-17 DIAGNOSIS — E785 Hyperlipidemia, unspecified: Secondary | ICD-10-CM

## 2020-01-17 DIAGNOSIS — M5416 Radiculopathy, lumbar region: Secondary | ICD-10-CM

## 2020-01-17 DIAGNOSIS — F418 Other specified anxiety disorders: Secondary | ICD-10-CM

## 2020-01-17 DIAGNOSIS — Z9989 Dependence on other enabling machines and devices: Secondary | ICD-10-CM

## 2020-01-17 DIAGNOSIS — G4733 Obstructive sleep apnea (adult) (pediatric): Secondary | ICD-10-CM

## 2020-01-17 LAB — CBC WITH DIFFERENTIAL/PLATELET
Basophils Absolute: 0.1 10*3/uL (ref 0.0–0.1)
Basophils Relative: 0.7 % (ref 0.0–3.0)
Eosinophils Absolute: 0.4 10*3/uL (ref 0.0–0.7)
Eosinophils Relative: 5.4 % — ABNORMAL HIGH (ref 0.0–5.0)
HCT: 39.6 % (ref 39.0–52.0)
Hemoglobin: 13.3 g/dL (ref 13.0–17.0)
Lymphocytes Relative: 20.3 % (ref 12.0–46.0)
Lymphs Abs: 1.6 10*3/uL (ref 0.7–4.0)
MCHC: 33.6 g/dL (ref 30.0–36.0)
MCV: 85.1 fl (ref 78.0–100.0)
Monocytes Absolute: 0.6 10*3/uL (ref 0.1–1.0)
Monocytes Relative: 8.1 % (ref 3.0–12.0)
Neutro Abs: 5 10*3/uL (ref 1.4–7.7)
Neutrophils Relative %: 65.5 % (ref 43.0–77.0)
Platelets: 309 10*3/uL (ref 150.0–400.0)
RBC: 4.66 Mil/uL (ref 4.22–5.81)
RDW: 15.4 % (ref 11.5–15.5)
WBC: 7.7 10*3/uL (ref 4.0–10.5)

## 2020-01-17 LAB — POCT GLYCOSYLATED HEMOGLOBIN (HGB A1C): Hemoglobin A1C: 6.3 % — AB (ref 4.0–5.6)

## 2020-01-17 LAB — LIPID PANEL
Cholesterol: 170 mg/dL (ref 0–200)
HDL: 58.2 mg/dL (ref >39.00)
LDL Cholesterol: 84 mg/dL (ref 0–99)
NonHDL: 112.06
Total CHOL/HDL Ratio: 3
Triglycerides: 139 mg/dL (ref 0.0–149.0)
VLDL: 27.8 mg/dL (ref 0.0–40.0)

## 2020-01-17 LAB — HEPATIC FUNCTION PANEL
ALT: 26 U/L (ref 0–53)
AST: 16 U/L (ref 0–37)
Albumin: 4.2 g/dL (ref 3.5–5.2)
Alkaline Phosphatase: 48 U/L (ref 39–117)
Bilirubin, Direct: 0.1 mg/dL (ref 0.0–0.3)
Total Bilirubin: 0.4 mg/dL (ref 0.2–1.2)
Total Protein: 7.1 g/dL (ref 6.0–8.3)

## 2020-01-17 MED ORDER — HYDROCHLOROTHIAZIDE 25 MG PO TABS: 25.0000 mg | ORAL_TABLET | Freq: Every day | ORAL | 1 refills | Status: DC

## 2020-01-17 MED ORDER — SERTRALINE HCL 100 MG PO TABS: 100.0000 mg | ORAL_TABLET | Freq: Every day | ORAL | 1 refills | Status: DC

## 2020-01-17 MED ORDER — METFORMIN HCL 1000 MG PO TABS: 1000.0000 mg | ORAL_TABLET | Freq: Every day | ORAL | 1 refills | Status: DC

## 2020-01-17 MED ORDER — GABAPENTIN 400 MG PO CAPS: 400.0000 mg | ORAL_CAPSULE | Freq: Two times a day (BID) | ORAL | 1 refills | Status: DC

## 2020-01-17 MED ORDER — LISINOPRIL 5 MG PO TABS: 5.0000 mg | ORAL_TABLET | Freq: Every day | ORAL | 1 refills | Status: DC

## 2020-01-17 NOTE — Patient Instructions (Signed)
Follow up in 5.5 months.     Diabetes Basics  Diabetes (diabetes mellitus) is a long-term (chronic) disease. It occurs when the body does not properly use sugar (glucose) that is released from food after you eat. Diabetes may be caused by one or both of these problems:  Your pancreas does not make enough of a hormone called insulin.  Your body does not react in a normal way to insulin that it makes. Insulin lets sugars (glucose) go into cells in your body. This gives you energy. If you have diabetes, sugars cannot get into cells. This causes high blood sugar (hyperglycemia). Follow these instructions at home: How is diabetes treated? You may need to take insulin or other diabetes medicines daily to keep your blood sugar in balance. Take your diabetes medicines every day as told by your doctor. List your diabetes medicines here: Diabetes medicines  Name of medicine: ______________________________ ? Amount (dose): _______________ Time (a.m./p.m.): _______________ Notes: ___________________________________  Name of medicine: ______________________________ ? Amount (dose): _______________ Time (a.m./p.m.): _______________ Notes: ___________________________________  Name of medicine: ______________________________ ? Amount (dose): _______________ Time (a.m./p.m.): _______________ Notes: ___________________________________ If you use insulin, you will learn how to give yourself insulin by injection. You may need to adjust the amount based on the food that you eat. List the types of insulin you use here: Insulin  Insulin type: ______________________________ ? Amount (dose): _______________ Time (a.m./p.m.): _______________ Notes: ___________________________________  Insulin type: ______________________________ ? Amount (dose): _______________ Time (a.m./p.m.): _______________ Notes: ___________________________________  Insulin type: ______________________________ ? Amount (dose):  _______________ Time (a.m./p.m.): _______________ Notes: ___________________________________  Insulin type: ______________________________ ? Amount (dose): _______________ Time (a.m./p.m.): _______________ Notes: ___________________________________  Insulin type: ______________________________ ? Amount (dose): _______________ Time (a.m./p.m.): _______________ Notes: ___________________________________ How do I manage my blood sugar?  Check your blood sugar levels using a blood glucose monitor as directed by your doctor. Your doctor will set treatment goals for you. Generally, you should have these blood sugar levels:  Before meals (preprandial): 80-130 mg/dL (4.4-7.2 mmol/L).  After meals (postprandial): below 180 mg/dL (10 mmol/L).  A1c level: less than 7%. Write down the times that you will check your blood sugar levels: Blood sugar checks  Time: _______________ Notes: ___________________________________  Time: _______________ Notes: ___________________________________  Time: _______________ Notes: ___________________________________  Time: _______________ Notes: ___________________________________  Time: _______________ Notes: ___________________________________  Time: _______________ Notes: ___________________________________  What do I need to know about low blood sugar? Low blood sugar is called hypoglycemia. This is when blood sugar is at or below 70 mg/dL (3.9 mmol/L). Symptoms may include:  Feeling: ? Hungry. ? Worried or nervous (anxious). ? Sweaty and clammy. ? Confused. ? Dizzy. ? Sleepy. ? Sick to your stomach (nauseous).  Having: ? A fast heartbeat. ? A headache. ? A change in your vision. ? Tingling or no feeling (numbness) around the mouth, lips, or tongue. ? Jerky movements that you cannot control (seizure).  Having trouble with: ? Moving (coordination). ? Sleeping. ? Passing out (fainting). ? Getting upset easily (irritability). Treating low  blood sugar To treat low blood sugar, eat or drink something sugary right away. If you can think clearly and swallow safely, follow the 15:15 rule:  Take 15 grams of a fast-acting carb (carbohydrate). Talk with your doctor about how much you should take.  Some fast-acting carbs are: ? Sugar tablets (glucose pills). Take 3-4 glucose pills. ? 6-8 pieces of hard candy. ? 4-6 oz (120-150 mL) of fruit juice. ? 4-6 oz (120-150 mL) of regular (not diet) soda. ?  1 Tbsp (15 mL) honey or sugar.  Check your blood sugar 15 minutes after you take the carb.  If your blood sugar is still at or below 70 mg/dL (3.9 mmol/L), take 15 grams of a carb again.  If your blood sugar does not go above 70 mg/dL (3.9 mmol/L) after 3 tries, get help right away.  After your blood sugar goes back to normal, eat a meal or a snack within 1 hour. Treating very low blood sugar If your blood sugar is at or below 54 mg/dL (3 mmol/L), you have very low blood sugar (severe hypoglycemia). This is an emergency. Do not wait to see if the symptoms will go away. Get medical help right away. Call your local emergency services (911 in the [place] Do not drive yourself to the hospital. Questions to ask your health care provider  Do I need to meet with a diabetes educator?  What equipment will I need to care for myself at home?  What diabetes medicines do I need? When should I take them?  How often do I need to check my blood sugar?  What number can I call if I have questions?  When is my next doctor's visit?  Where can I find a support group for people with diabetes? Where to find more information  American Diabetes Association: www.diabetes.org  American Association of Diabetes Educators: www.diabeteseducator.org/patient-resources Contact a doctor if:  Your blood sugar is at or above 240 mg/dL (13.3 mmol/L) for 2 days in a row.  You have been sick or have had a fever for 2 days or more, and you are not getting  better.  You have any of these problems for more than 6 hours: ? You cannot eat or drink. ? You feel sick to your stomach (nauseous). ? You throw up (vomit). ? You have watery poop (diarrhea). Get help right away if:  Your blood sugar is lower than 54 mg/dL (3 mmol/L).  You get confused.  You have trouble: ? Thinking clearly. ? Breathing. Summary  Diabetes (diabetes mellitus) is a long-term (chronic) disease. It occurs when the body does not properly use sugar (glucose) that is released from food after digestion.  Take insulin and diabetes medicines as told.  Check your blood sugar every day, as often as told.  Keep all follow-up visits as told by your doctor. This is important. This information is not intended to replace advice given to you by your health care provider. Make sure you discuss any questions you have with your health care provider. Document Revised: 02/10/2019 Document Reviewed: 08/22/2017 Elsevier Patient Education  Seba Dalkai.

## 2020-01-17 NOTE — Progress Notes (Signed)
Christopher Ingram , 04-08-1976, 44 y.o., male MRN: [id number] Patient Care Team    Relationship Specialty Notifications Start End  [name], DO PCP - General Family Medicine  01/03/16    Chief Complaint  Patient presents with  . Follow-up    htn   Subjective: Christopher Ingram is a 44 y.o.  Depression/anxiety: Patient reports compliance with Zoloft 100 mg daily and feels the medicine is working well. He is experiencing sexual side effects, but he states he is ok dealing with it since he is feeling better. His Sister on hospice currently for cancer.  Patient states he was on prozac 20 mg and zoloft 50 mg at different times in the past. He felt the zoloft worked better for him. He reports he has trouble focusing with increased Anxiety. His current triggers surround his work life and financial issues at home. He would like to start back on the Zoloft medication.  Hypertension/morbid obesity/elevated tg : Pt reports complaince with his HCTZ 25 mg and lisinopril 5 mg. He is also eating a low salt diet. He has been exercising He and his wife has change their diets a great deal and both are losing weight. Patient denies chest pain, shortness of breath, dizziness or lower extremity edema.   He is tolerating statin.  Diet: low sodium, low carb Exercise: routinely RF: HTN, HLD, DM, obesity, FH HD (father MI 12)  Diabetes:  Diagnosed 10/2018 with an a1c of 9.5. He has made many changes over the last few months with his diet and exercise. He has lost weight. Pt did well with janumet-- unfortunately it is now too expensive.Patient denies dizziness, hyperglycemic or hypoglycemic events. Patient denies numbness, tingling in the extremities or nonhealing wounds of feet.  PNA series: pneumovax completed today Flu shot: completed today (recommneded yearly) A1c: 9.5>5.8>5.8> 6.3 today!!  Back pain:  Pt reports gabapentin is helpful for his lumbar radiculopathy. He reports he is only taking 1-2 times a day  instead of TID. He denies side effects Prior note:  Patient reports he is having discomfort in his lower back that is creating pain in his anterior thighs when laying flat.  He states that the discomfort from the waist down.  He experienced this in the past and was placed on gabapentin and told it was secondary to his obesity.  He was unable to tolerate using low dose of gabapentin secondary to sedation.  He reports had resolved and only returned within the last week.  He has been recently diagnosed with diabetes and started on metformin.  He states he has been doing very well following a low sugar lower carbohydrate diet and has lost weight.  He denies any bladder or bowel dysfunction.  He does not recall if he has had an x-ray of his lumbar spine in the past.  No x-rays were available in EMR or care everywhere tab to review.  He reports a few years ago when this occurred it was mostly on his right side.  He states now it is mostly on his left side but does endorse bilateral discomfort.   Mood disorder screen: negative 05/22/2016  Depression screen [facility] 2/9 10/11/2019 10/13/2018 04/08/2018  Decreased Interest 0 0 0  Down, Depressed, Hopeless 0 0 0  PHQ - 2 Score 0 0 0  Altered sleeping 3 0 0  Tired, decreased energy 1 0 1  Change in appetite 0 2 0  Feeling bad or failure about yourself  0 0 0  Trouble  concentrating 0 0 0  Moving slowly or fidgety/restless 0 0 0  Suicidal thoughts 0 0 0  PHQ-9 Score _0 Difficult doing work/chores Very difficult Not difficult at all Not difficult at all   GAD 7 : Generalized Anxiety Score 10/11/2019 10/13/2018 07/18/2017 05/22/2016  Nervous, Anxious, on Edge 0 0 0 3  Control/stop worrying 0 0 0 3  Worry too much - different things 2 0 0 2  Trouble relaxing 0 0 0 2  Restless 0 0 0 0  Easily annoyed or irritable 0 0 0 0  Afraid - awful might happen 0 0 0 3  Total GAD 7 Score 2 0 0 13  Anxiety Difficulty Not difficult at all Not difficult at all - Somewhat  difficult    Allergies  Allergen Reactions  . Aspirin     hives   Social History   Tobacco Use  . Smoking status: Never Smoker  . Smokeless tobacco: Never Used  Substance Use Topics  . Alcohol use: No   Past Medical History:  Diagnosis Date  . Allergy   . 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 Anxiety   . Asthma   . Chicken pox   . Depression, major   . GERD (gastroesophageal reflux disease)   . Hypertension   . Internal hemorrhoids    rectal bleeding, colonoscopy  . Migraine   . OSA on CPAP 04/2012   AHI23  . RAD (reactive airway disease)    ? asthma, normal studies, inhaler use  . Snoring   . 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 EYE SURGERY     lazy eye   Family History  Problem Relation Age of Onset  . Diabetes Mother   . Diabetes Father   . Early death Father        heart attack 38  . Breast cancer Sister   . Liver cancer Sister   . Mental illness Brother    Allergies as of 01/17/2020      Reactions   Aspirin    hives      Medication List       Accurate as of January 17, 2020  8:41 AM. If you have any questions, ask your nurse or doctor.        Accu-Chek Guide test strip Generic drug: glucose blood USE TO CHECK FASTING BLOOD SUGAR DAILY   accu-chek soft touch lancets Use to check sugars 3x daily ICD-10 E11.9   Albuterol Sulfate 108 (90 Base) MCG/ACT Aepb Commonly known as: ProAir RespiClick Inhale 2 puffs into the lungs every 4 (four) hours as needed.   atorvastatin 20 MG tablet Commonly known as: LIPITOR Take 1 tablet (20 mg total) by mouth daily.   blood glucose meter kit and supplies Dispense based on patient and insurance preference. Use daily to check fasting blood glucose  (FOR ICD-10 E11.9).   cetirizine 10 MG tablet Commonly known as: ZYRTEC Take 10 mg by mouth daily.   gabapentin 400 MG capsule Commonly known as:  Neurontin Take 1 capsule (400 mg total) by mouth 2 (two) times daily. What changed: when to take this Changed by: Howard Pouch, DO   hydrochlorothiazide 25 MG tablet Commonly known as: HYDRODIURIL Take 1 tablet (25 mg total) by mouth daily.   lisinopril 5 MG tablet Commonly known as: ZESTRIL Take 1 tablet (5 mg total) by mouth  daily.   metFORMIN 1000 MG tablet Commonly known as: GLUCOPHAGE Take 1 tablet (1,000 mg total) by mouth daily with breakfast.   sertraline 100 MG tablet Commonly known as: ZOLOFT Take 1 tablet (100 mg total) by mouth daily.       Results for orders placed or performed in visit on 01/17/20 (from the past 24 hour(s))  POCT glycosylated hemoglobin (Hb A1C)     Status: Abnormal   Collection Time: 01/17/20  8:21 AM  Result Value Ref Range   Hemoglobin A1C 6.3 (A) 4.0 - 5.6 %   HbA1c POC (<> result, manual entry)     HbA1c, POC (prediabetic range)     HbA1c, POC (controlled diabetic range)     No results found.  ROS: Negative, with the exception of above mentioned in HPI  Objective:  BP 130/86 (BP Location: Left Arm, Patient Position: Sitting, Cuff Size: Large)   Pulse 79   Temp 98.2 F (36.8 C) (Oral)   Resp 16   Wt (!) 370 lb 3.2 oz (167.9 kg)   SpO2 96%   BMI 46.27 kg/m  Body mass index is 46.27 kg/m. Gen: Afebrile. No acute distress.  HENT: AT. [place].  Eyes:Pupils Equal Round Reactive to light, Extraocular movements intact,  Conjunctiva without redness, discharge or icterus. Neck/lymp/endocrine: Supple,no lymphadenopathy, no thyromegaly CV: RRR no murmur, no edema, +2/4 P posterior tibialis pulses Chest: CTAB, no wheeze or crackles Skin: no rashes, purpura or petechiae.  Neuro:  Normal gait. PERLA. EOMi. Alert. Oriented x3  Psych: Normal affect, dress and demeanor. Normal speech. Normal thought content and judgment.  Diabetic Foot Exam - Simple   Simple Foot Form Diabetic Foot exam was performed with the following findings: Yes 01/17/2020   8:40 AM  Visual Inspection No deformities, no ulcerations, no other skin breakdown bilaterally: Yes Sensation Testing Intact to touch and monofilament testing bilaterally: Yes Pulse Check Comments     Results for orders placed or performed in visit on 01/17/20 (from the past 24 hour(s))  POCT glycosylated hemoglobin (Hb A1C)     Status: Abnormal   Collection Time: 01/17/20  8:21 AM  Result Value Ref Range   Hemoglobin A1C 6.3 (A) 4.0 - 5.6 %   HbA1c POC (<> result, manual entry)     HbA1c, POC (prediabetic range)     HbA1c, POC (controlled diabetic range)      Assessment/Plan: Christopher Ingram is a 44 y.o. male present for acute OV for increased anxiety. Anxiety with depression - Stable.  - continue zoloft 100 mg QD - f/u 5.5 months  Essential hypertension, benign/elevated Tg/morbid obesity - stable.  - continue HCTZ 25 mg QD - continue  lisinopril 5 mg qd. - low salt diet. - f/u 5.5 months  Diabetes mellitus, new onset ([place]) - stable.  - Continue the diet and weight loss - a1c 9.5>5.8>5.8>6.3  today. - continue  metformin to 1000 mg QD - Janumet was too expensive PNA series: pneumovax completed  Flu shot: completed  (recommneded yearly) Foot exam: 01/2020 Eye exam: completed 06/2019 [place]: will collect next visit. A1c: 9.5>5.8> 5.8>6.3 F/u 5.5 mos  Lumbar radiculopathy - Xray completed 11/2018.Mild multilevel degenerative disc disease with small anterior osteophytes. No fracture or traumatic malalignment. -Trial of Elavil; was not helpful.  - continue gabapentin   Orders Placed This Encounter  Procedures  . CBC w/Diff  . Hepatic function panel  . Lipid panel  . POCT glycosylated hemoglobin (Hb A1C)   Meds ordered this encounter  Medications  . sertraline (ZOLOFT) 100 MG tablet    Sig: Take 1 tablet (100 mg total) by mouth daily.    Dispense:  90 tablet    Refill:  1  . metFORMIN (GLUCOPHAGE) 1000 MG tablet    Sig: Take 1 tablet (1,000 mg total) by mouth  daily with breakfast.    Dispense:  90 tablet    Refill:  1  . lisinopril (ZESTRIL) 5 MG tablet    Sig: Take 1 tablet (5 mg total) by mouth daily.    Dispense:  90 tablet    Refill:  1  . hydrochlorothiazide (HYDRODIURIL) 25 MG tablet    Sig: Take 1 tablet (25 mg total) by mouth daily.    Dispense:  90 tablet    Refill:  1  . gabapentin (NEURONTIN) 400 MG capsule    Sig: Take 1 capsule (400 mg total) by mouth 2 (two) times daily.    Dispense:  180 capsule    Refill:  1   Referral Orders  No referral(s) requested today    electronically signed by:  Howard Pouch, DO  [place]

## 2020-02-09 IMAGING — DX LUMBAR SPINE - COMPLETE 4+ VIEW
5 series · 5 of 5 positions shown · non-contrast
Comparison: None.

CLINICAL DATA: Pain radiating into bilateral lower extremities. No
older recent trauma.

EXAM:
LUMBAR SPINE - COMPLETE 4+ VIEW

[l-spine obl (1 of 2)]
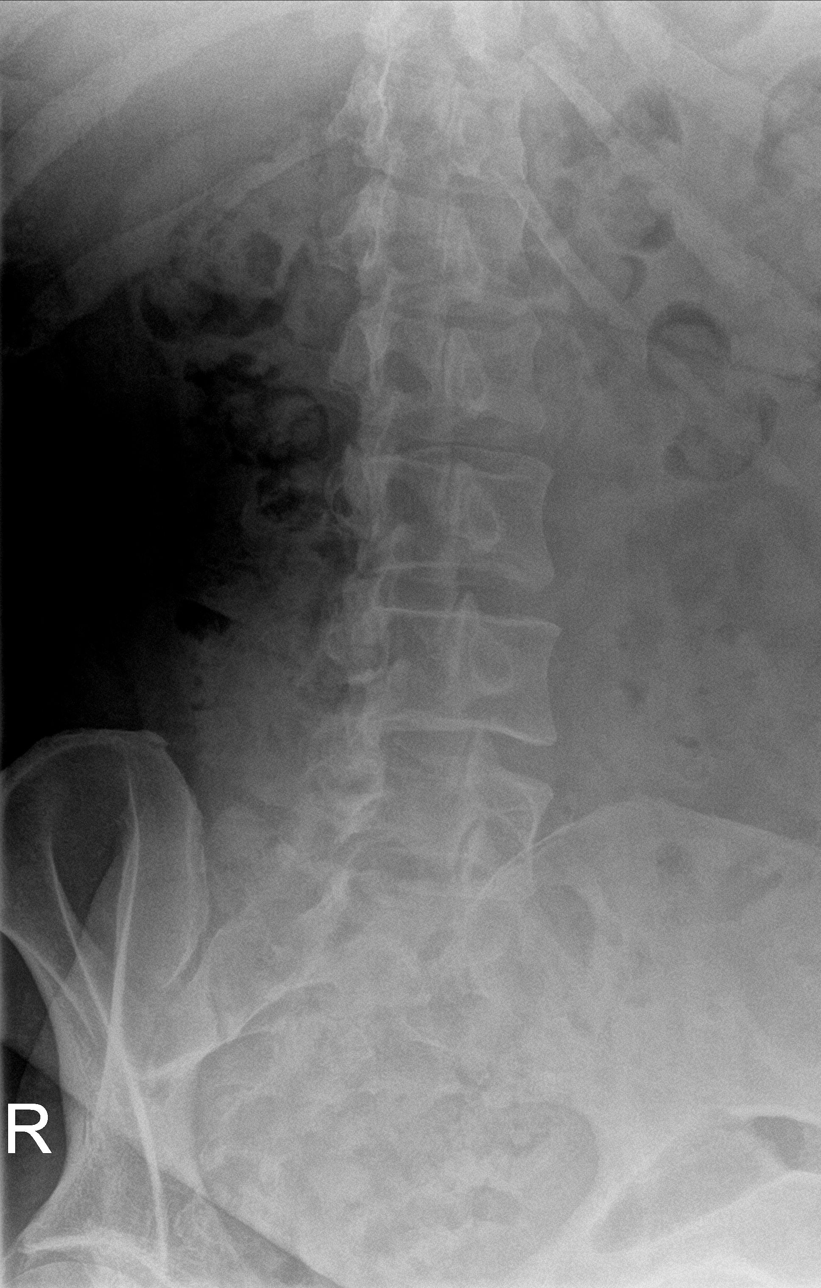

[l-spine obl (2 of 2)]
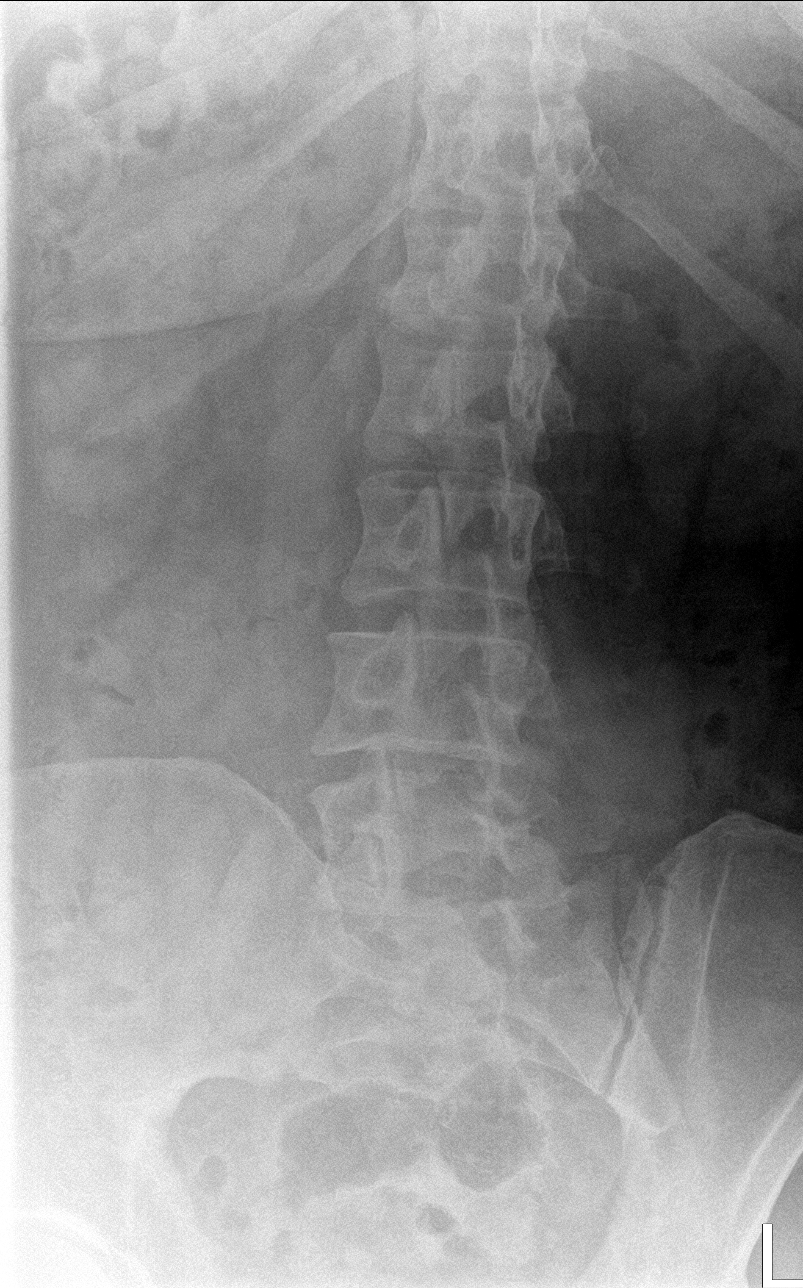

[l-spine lat]
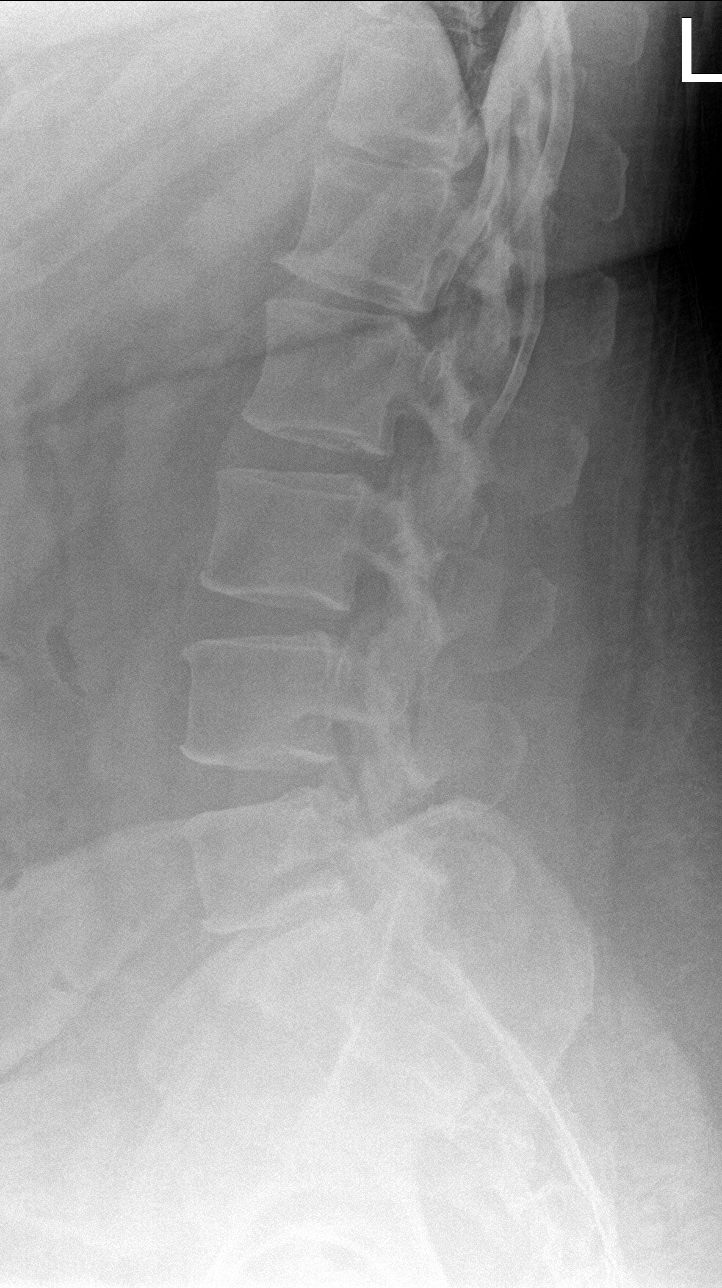

[l-spine spot]
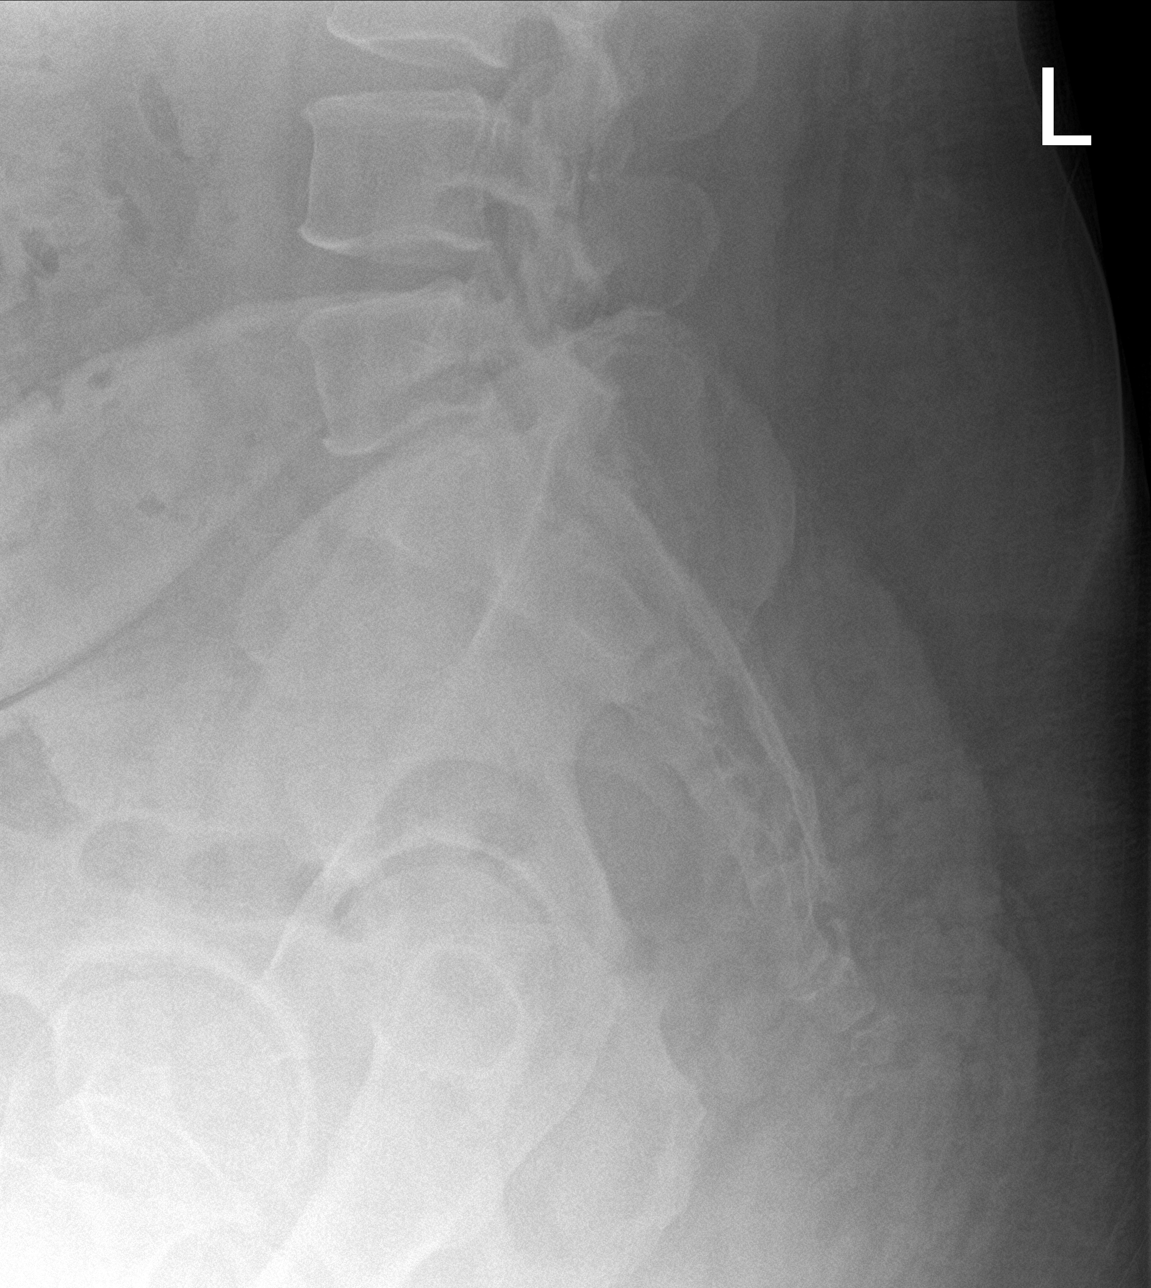

[l-spine ap]
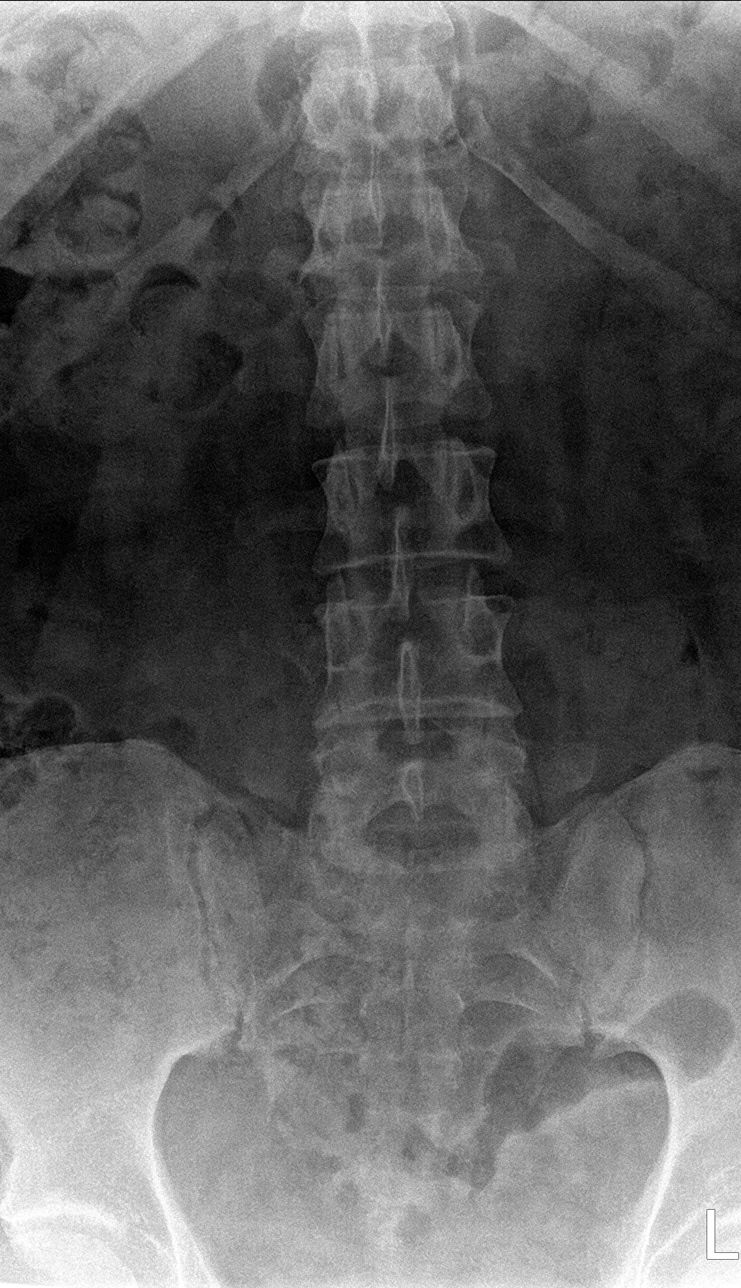

[5 of 5 positions shown; findings below may reference images not displayed]

FINDINGS: Mild multilevel degenerative disc disease with small anterior
osteophytes. No fracture or traumatic malalignment.
IMPRESSION: Mild degenerative disc disease.

## 2020-02-28 ENCOUNTER — Other Ambulatory Visit: Payer: BC Managed Care – PPO

## 2020-02-28 ENCOUNTER — Telehealth: Payer: Self-pay

## 2020-02-28 DIAGNOSIS — Z20822 Contact with and (suspected) exposure to covid-19: Secondary | ICD-10-CM | POA: Diagnosis not present

## 2020-02-28 NOTE — Telephone Encounter (Signed)
[facility] - Client TELEPHONE ADVICE RECORD [facility] Patient Name: Christopher Ingram Gender: Male DOB: Aug 19, 1975 Age: 44 Y 2 M 5 D Return Phone Number: [phone] (Primary), [phone] (Secondary) Address: City/State/Zip: [place] [address] Client [facility] - Client Client Site [place] - Day Physician Raoul Pitch, [place] Contact Type Call Who Is Calling Patient / Member / Family / Caregiver Call Type Triage / Clinical Relationship To Patient Self Return Phone Number [phone] (Primary) Chief Complaint CHEST PAIN (>=21 years) - pain, pressure, heaviness or tightness Reason for Call Symptomatic / Request for [place] states he has been having chest pain Additional Comment Warm xfer from office for urgent triage Translation No Nurse Assessment Nurse: Raphael Gibney, RN, Vanita Ingles Date/Time (Eastern Time): 02/28/2020 2:32:12 PM Confirm and document reason for call. If symptomatic, describe symptoms. ---Caller states he has chest pain. he is coughing. has a sore throat. has a COVID test for tonight. no fever. chest pain when he coughs. symptoms started on Thursday. pt states he has a virtual appt for tomorrow. Does the patient have any new or worsening symptoms? ---Yes Will a triage be completed? ---Yes Related visit to physician within the last 2 weeks? ---No Does the PT have any chronic conditions? (i.e. diabetes, asthma, this includes High risk factors for pregnancy, etc.) ---No Is this a behavioral health or substance abuse call? ---No Guidelines Guideline Title Affirmed Question Affirmed Notes Nurse Date/Time ([place] Time) COVID-19 - Diagnosed or Suspected MILD difficulty breathing (e.g., minimal/no SOB at rest, SOB with walking, pulse <100) Raphael Gibney, RN, Vanita Ingles 02/28/2020 2:34:36 PM Disp. Time Eilene Ghazi Time) Disposition Final User 02/28/2020 2:30:10 PM Send to Urgent Queue  Dalia Heading 02/28/2020 2:39:04 PM Go to ED Now (or PCP triage) Yes Raphael Gibney, RN, VeraPLEASE NOTE: All timestamps contained within this report are represented as [place] Standard Time. CONFIDENTIALTY NOTICE: This fax transmission is intended only for the addressee. It contains information that is legally privileged, confidential or otherwise protected from use or disclosure. If you are not the intended recipient, you are strictly prohibited from reviewing, disclosing, copying using or disseminating any of this information or taking any action in reliance on or regarding this information. If you have received this fax in error, please notify us immediately by telephone so that we can arrange for its return to [place]. Phone: [phone], Toll-Free: [phone], Fax: [phone] Page: 2 of 2 Call Id: [id number] [place] Disagree/Comply Disagree Caller Understands Yes PreDisposition Did not know what to do Care Advice Given Per Guideline GO TO ED NOW (OR PCP TRIAGE): * IF NO PCP (PRIMARY CARE PROVIDER) SECOND-LEVEL TRIAGE: You need to be seen within the next hour. Go to the [facility] at _____________ [place] as soon as you can. GENERAL CARE ADVICE FOR COVID-19 SYMPTOMS: * The treatment is the same whether you have COVID-19, influenza or some other respiratory virus. * Cough: Use cough drops. * Feeling dehydrated: Drink extra liquids. If the air in your home is dry, use a humidifier. CARE ADVICE given per COVID-19 - DIAGNOSED OR SUSPECTED (Adult) guideline. Comments User: Dannielle Burn, RN Date/Time Eilene Ghazi Time): 02/28/2020 2:38:46 PM pt does not want to go to ER or urgent care but will keep his virtual appt tomorrow. User: Dannielle Burn, RN Date/Time Eilene Ghazi Time): 02/28/2020 2:43:31 PM Called office and spoke to Danielson and gave report that pt has a cough. Has chest pain when he coughs. He is a little SOB. Triage outcome of go  to ER now (or PCP triage) but pt has a virutal appt tomorrow and wants  to keep that . Referrals GO TO FACILITY REFUSED  Spoke with pt and advised to go to ED if he feels like the pain is getting worse. Pt states that he thinks it is a cold but is getting tested for COVID but did not want to go to ED as of now.

## 2020-02-29 ENCOUNTER — Encounter: Payer: Self-pay | Admitting: Family Medicine

## 2020-02-29 ENCOUNTER — Telehealth (INDEPENDENT_AMBULATORY_CARE_PROVIDER_SITE_OTHER): Payer: BC Managed Care – PPO | Admitting: Family Medicine

## 2020-02-29 VITALS — Temp 96.8°F

## 2020-02-29 DIAGNOSIS — J4520 Mild intermittent asthma, uncomplicated: Secondary | ICD-10-CM

## 2020-02-29 DIAGNOSIS — R059 Cough, unspecified: Secondary | ICD-10-CM

## 2020-02-29 DIAGNOSIS — R05 Cough: Secondary | ICD-10-CM

## 2020-02-29 DIAGNOSIS — R0981 Nasal congestion: Secondary | ICD-10-CM | POA: Diagnosis not present

## 2020-02-29 DIAGNOSIS — R067 Sneezing: Secondary | ICD-10-CM

## 2020-02-29 MED ORDER — BENZONATATE 100 MG PO CAPS: 100.0000 mg | ORAL_CAPSULE | Freq: Three times a day (TID) | ORAL | 0 refills | Status: DC | PRN

## 2020-02-29 NOTE — Progress Notes (Signed)
Virtual Visit via Video Note  I connected with Christopher Ingram  on 02/29/20 at 10:40 AM EDT by a video enabled telemedicine application and verified that I am speaking with the correct person using two identifiers.  Location patient: home, [place] Location provider:work or home office Persons participating in the virtual visit: patient, provider  I discussed the limitations of evaluation and management by telemedicine and the availability of in person appointments. The patient expressed understanding and agreed to proceed.   HPI:  Acute telemedicine visit for cough: -Onset: 02/24/20 -Symptoms include: cough, congestion, nasal congestion, sneezing, scratchy throat, reports mild CP and HA sometimes when coughs sometimes - feels more like "strain" from cough  -currently no headache or chest pain even when coughs, this was the other night -Denies:fever, SOB, NVD, loss of taste or smell, body aches, wheezing -had a COVID19 test last night, waiting on results -no known sick contacts, but works in a call center -Has tried: robitussin - helps a little -Pertinent past medical history: mild, intermittent asthma, reports has not required his albuterol this illness -Pertinent medication allergies: asa -COVID-19 vaccine status: fully vaccinated with pfizer  ROS: See pertinent positives and negatives per HPI.  Past Medical History:  Diagnosis Date  . Allergy   . 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 Anxiety   . Asthma   . Chicken pox   . Depression, major   . GERD (gastroesophageal reflux disease)   . Hypertension   . Internal hemorrhoids    rectal bleeding, colonoscopy  . Migraine   . OSA on CPAP 04/2012   AHI23  . RAD (reactive airway disease)    ? asthma, normal studies, inhaler use  . Snoring   . 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 EYE SURGERY     lazy eye     Current Outpatient  Medications:  .  ACCU-CHEK GUIDE test strip, USE TO CHECK FASTING BLOOD SUGAR DAILY, Disp: , Rfl:  .  Albuterol Sulfate (PROAIR RESPICLICK) 292 (90 Base) MCG/ACT AEPB, Inhale 2 puffs into the lungs every 4 (four) hours as needed., Disp: 1 each, Rfl: 0 .  atorvastatin (LIPITOR) 20 MG tablet, Take 1 tablet (20 mg total) by mouth daily., Disp: 90 tablet, Rfl: 3 .  blood glucose meter kit and supplies, Dispense based on patient and insurance preference. Use daily to check fasting blood glucose  (FOR ICD-10 E11.9)., Disp: 1 each, Rfl: 11 .  cetirizine (ZYRTEC) 10 MG tablet, Take 10 mg by mouth daily., Disp: , Rfl:  .  gabapentin (NEURONTIN) 400 MG capsule, Take 1 capsule (400 mg total) by mouth 2 (two) times daily., Disp: 180 capsule, Rfl: 1 .  hydrochlorothiazide (HYDRODIURIL) 25 MG tablet, Take 1 tablet (25 mg total) by mouth daily., Disp: 90 tablet, Rfl: 1 .  Lancets (ACCU-CHEK SOFT TOUCH) lancets, Use to check sugars 3x daily ICD-10 E11.9, Disp: 90 each, Rfl: 0 .  lisinopril (ZESTRIL) 5 MG tablet, Take 1 tablet (5 mg total) by mouth daily., Disp: 90 tablet, Rfl: 1 .  metFORMIN (GLUCOPHAGE) 1000 MG tablet, Take 1 tablet (1,000 mg total) by mouth daily with breakfast., Disp: 90 tablet, Rfl: 1 .  sertraline (ZOLOFT) 100 MG tablet, Take 1 tablet (100 mg total) by mouth daily., Disp: 90 tablet, Rfl: 1 .  benzonatate (TESSALON PERLES) 100 MG capsule, Take 1 capsule (100 mg total) by mouth 3 (three) times  daily as needed., Disp: 20 capsule, Rfl: 0  EXAM:  VITALS per patient if applicable:  GENERAL: alert, oriented, appears well and in no acute distress  HEENT: atraumatic, conjunttiva clear, no obvious abnormalities on inspection of external nose and ears  NECK: normal movements of the head and neck  LUNGS: on inspection no signs of respiratory distress, breathing rate appears normal, no obvious gross SOB, gasping or wheezing  CV: no obvious cyanosis  MS: moves all visible extremities without  noticeable abnormality  PSYCH/NEURO: pleasant and cooperative, no obvious depression or anxiety, speech and thought processing grossly intact  ASSESSMENT AND PLAN:  Discussed the following assessment and plan:  Cough  Nasal congestion  Sneezing  Mild intermittent asthma, unspecified whether complicated  -we discussed possible serious and likely etiologies, options for evaluation and workup, limitations of telemedicine visit vs in person visit, treatment, treatment risks and precautions. Pt prefers to treat via telemedicine empirically rather than in person at this moment.  Query possible viral upper respiratory infection, possible breakthrough COVID-19 cases, possible mild bronchitis versus other.  He is fully vaccinated for COVID-19 and has a COVID-19 test pending.  Opted for nasal saline, short course of nasal decongestant, Allegra or Zyrtec once daily, albuterol as needed every 4-6 hours and sent a prescription for Tessalon for the cough.  Recommended staying home while sick and for full 10 days if his Covid test is positive.  Did discuss Mab treatment and advised if a positive Covid test follow-up promptly with his primary care doctor or via telemedicine or urgent care as he would qualify for treatment. Work/School slipped offered: provided in patient instructions   Advised to seek prompt follow up telemedicine visit or in person care if worsening, new symptoms arise, or if is not improving with treatment. Did let this patient know that I only do telemedicine on Tuesdays and Thursdays for [place]. Advised to schedule follow up visit with PCP or [facility] if any further questions or concerns to avoid delays in care.  Discussed options for urgent care in person treatment should his symptoms worsen.   I discussed the assessment and treatment plan with the patient. The patient was provided an opportunity to ask questions and all were answered. The patient agreed with the plan and demonstrated an  understanding of the instructions.     Lucretia Kern, DO

## 2020-02-29 NOTE — Patient Instructions (Signed)
   ---------------------------------------------------------------------------------------------------------------------------      WORK SLIP:  Patient Christopher Ingram,  [birth year], was seen for a medical visit today, 02/29/20 . Please excuse from work according to the [facility] guidelines for a COVID like illness. We advise 10 days minimum from the onset of symptoms (02/24/20) PLUS 1 day of no fever and improved symptoms. Will defer to employer for a sooner return to work if [place] testing is negative and the symptoms have resolved. Advise following CDC guidelines.   Sincerely: E-signature: Dr. Colin Benton, DO [place] Ph: [phone]   ------------------------------------------------------------------------------------------------------------------------------    Home Care Tips:  -stay home while sick, and if you have [place] please stay home for a full 10 days since the onset of symptoms PLUS one day of no fever and feeling better  -use your albuterol every 4-6 hours as needed per instructions  -allegra or zyrtec once daily   -I sent the medication(s) we discussed to your pharmacy: Meds ordered this encounter  Medications  . benzonatate (TESSALON PERLES) 100 MG capsule    Sig: Take 1 capsule (100 mg total) by mouth 3 (three) times daily as needed.    Dispense:  20 capsule    Refill:  0     -can use tylenol or aleve if needed for fevers, aches and pains per instructions  -can use nasal saline a few times per day if nasal congestion, sometimes a short course of Afrin nasal spray for 3 days can help as well  -stay hydrated, drink plenty of fluids and eat small healthy meals - avoid dairy  -can take 1000 IU Vit D3 and Vit C lozenges per instructions  -follow up with your doctor in 2-3 days unless improving and feeling better  I hope you are feeling better soon! Seek in-person care or a follow up telemedicine visit promptly if your symptoms worsen, new  concerns arise or you are not improving as expected. Call 911 if severe symptoms.

## 2020-03-01 LAB — SARS-COV-2, NAA 2 DAY TAT

## 2020-03-01 LAB — NOVEL CORONAVIRUS, NAA: SARS-CoV-2, NAA: NOT DETECTED

## 2020-04-13 ENCOUNTER — Other Ambulatory Visit: Payer: Self-pay | Admitting: Family Medicine

## 2020-04-13 DIAGNOSIS — I10 Essential (primary) hypertension: Secondary | ICD-10-CM

## 2020-04-13 DIAGNOSIS — F418 Other specified anxiety disorders: Secondary | ICD-10-CM

## 2020-04-14 ENCOUNTER — Telehealth: Payer: Self-pay

## 2020-04-14 NOTE — Telephone Encounter (Signed)
Spoke with pt and informed him that he will not be due for refills for another 4 mos

## 2020-04-14 NOTE — Telephone Encounter (Signed)
Patient calling about all 5 of his medications being rejected/declined. Please advise patient by calling [phone].  Thank you

## 2020-06-17 DIAGNOSIS — E109 Type 1 diabetes mellitus without complications: Secondary | ICD-10-CM | POA: Diagnosis not present

## 2020-06-17 LAB — HM DIABETES EYE EXAM

## 2020-06-26 ENCOUNTER — Other Ambulatory Visit: Payer: Self-pay | Admitting: Family Medicine

## 2020-07-14 ENCOUNTER — Ambulatory Visit (INDEPENDENT_AMBULATORY_CARE_PROVIDER_SITE_OTHER): Payer: BC Managed Care – PPO | Admitting: Family Medicine

## 2020-07-14 ENCOUNTER — Encounter: Payer: Self-pay | Admitting: Family Medicine

## 2020-07-14 ENCOUNTER — Other Ambulatory Visit: Payer: Self-pay

## 2020-07-14 VITALS — BP 134/89 | HR 74 | Temp 98.6°F | Ht 75.0 in | Wt 380.0 lb

## 2020-07-14 DIAGNOSIS — E1169 Type 2 diabetes mellitus with other specified complication: Secondary | ICD-10-CM

## 2020-07-14 DIAGNOSIS — I10 Essential (primary) hypertension: Secondary | ICD-10-CM | POA: Diagnosis not present

## 2020-07-14 DIAGNOSIS — F418 Other specified anxiety disorders: Secondary | ICD-10-CM

## 2020-07-14 DIAGNOSIS — E119 Type 2 diabetes mellitus without complications: Secondary | ICD-10-CM | POA: Diagnosis not present

## 2020-07-14 DIAGNOSIS — M5416 Radiculopathy, lumbar region: Secondary | ICD-10-CM

## 2020-07-14 DIAGNOSIS — E785 Hyperlipidemia, unspecified: Secondary | ICD-10-CM

## 2020-07-14 LAB — POCT GLYCOSYLATED HEMOGLOBIN (HGB A1C)
HbA1c POC (<> result, manual entry): 6.7 % (ref 4.0–5.6)
HbA1c, POC (controlled diabetic range): 6.7 % (ref 0.0–7.0)
HbA1c, POC (prediabetic range): 6.7 % — AB (ref 5.7–6.4)
Hemoglobin A1C: 6.7 % — AB (ref 4.0–5.6)

## 2020-07-14 MED ORDER — SERTRALINE HCL 50 MG PO TABS: 50.0000 mg | ORAL_TABLET | Freq: Every day | ORAL | 1 refills | Status: DC

## 2020-07-14 MED ORDER — GABAPENTIN 400 MG PO CAPS
400.0000 mg | ORAL_CAPSULE | Freq: Three times a day (TID) | ORAL | 1 refills | Status: DC
Start: 2020-07-14 — End: 2020-08-23

## 2020-07-14 MED ORDER — METFORMIN HCL 1000 MG PO TABS: 1000.0000 mg | ORAL_TABLET | Freq: Two times a day (BID) | ORAL | 1 refills | Status: DC

## 2020-07-14 MED ORDER — HYDROCHLOROTHIAZIDE 25 MG PO TABS: 25.0000 mg | ORAL_TABLET | Freq: Every day | ORAL | 1 refills | Status: DC

## 2020-07-14 MED ORDER — DULOXETINE HCL 30 MG PO CPEP: 30.0000 mg | ORAL_CAPSULE | Freq: Every day | ORAL | 1 refills | Status: DC

## 2020-07-14 MED ORDER — ATORVASTATIN CALCIUM 20 MG PO TABS
ORAL_TABLET | ORAL | 3 refills | Status: DC
Start: 2020-07-14 — End: 2021-02-21

## 2020-07-14 MED ORDER — LISINOPRIL 10 MG PO TABS: 10.0000 mg | ORAL_TABLET | Freq: Every day | ORAL | 1 refills | Status: DC

## 2020-07-14 NOTE — Patient Instructions (Addendum)
Nice to see you today.  Your a1c is 6.7> increased metformin to twice a day.  Your blood pressure is still above goal for a diabetic (<130/80). I have increased your lisinopril dose to 10 mg a day.  Depression: zoloft decreased to 50 mg a day Start Cymbalta 30 mg a day.  gabapentin increased to every 8 hours.   Next appt in 3.5 mos. (end of July)

## 2020-07-14 NOTE — Progress Notes (Signed)
Christopher Ingram , 09-Apr-1976, 45 y.o., male MRN: [id number] Patient Care Team    Relationship Specialty Notifications Start End  [name], DO PCP - General Family Medicine  01/03/16    Chief Complaint  Patient presents with  . Depression   Subjective: Christopher Ingram is a 45 y.o.  Depression/anxiety: Patient reports compliance with Zoloft 100 mg daily and feels the medicine is possibly not working as well or he has increased depression.  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  Patient states he was on prozac 20 mg and zoloft 50 mg at different times in the past. He felt the zoloft worked better for him. He reports he has trouble focusing with increased Anxiety. His current triggers surround his work life and financial issues at home. He would like to start back on the Zoloft medication.  Hypertension/morbid obesity/elevated tg : Pt reports compliance with his HCTZ 25 mg and lisinopril 5 mg. He is also eating a low salt diet. He has been exercising He and his wife has change their diets a great deal and both are losing weight. Patient denies chest pain, shortness of breath, dizziness or lower extremity edema.    He is tolerating statin.  Diet: low sodium, low carb Exercise: routinely RF: HTN, HLD, DM, obesity, FH HD (father MI 22)  Diabetes:  Diagnosed 10/2018 with an a1c of 9.5. He has made many changes over the last few months with his diet and exercise. He has lost weight. Pt did well with janumet-- unfortunately it is now too expensive.Patient denies dizziness, hyperglycemic or hypoglycemic events. Patient denies dizziness, hyperglycemic or hypoglycemic events. Patient denies numbness, tingling in the extremities or nonhealing wounds of feet. Tolerating metformin 1000 mg qd. PNA series: pneumovax completed  Flu shot: completed  2021(recommneded yearly) Foot exam: 01/2020 Eye exam: completed 06/2020 Malb: will  collect next visit. A1c: 9.5>5.8> 5.8>6.3> 6.7 today F/u 3.5 mos  Back pain:Pt reports gabapentin is helpful for his lumbar radiculopathy. He reports he is only taking 1-2 times a day instead of TID. He denies side effects.  He does endorse increased back pain. Prior note:  Patient reports he is having discomfort in his lower back that is creating pain in his anterior thighs when laying flat.  He states that the discomfort from the waist down.  He experienced this in the past and was placed on gabapentin and told it was secondary to his obesity.  He was unable to tolerate using low dose of gabapentin secondary to sedation.  He reports had resolved and only returned within the last week.  He has been recently diagnosed with diabetes and started on metformin.  He states he has been doing very well following a low sugar lower carbohydrate diet and has lost weight.  He denies any bladder or bowel dysfunction.  He does not recall if he has had an x-ray of his lumbar spine in the past.  No x-rays were available in EMR or care everywhere tab to review.  He reports a few years ago when this occurred it was mostly on his right side.  He states now it is mostly on his left side but does endorse bilateral discomfort.   Mood disorder screen: negative 05/22/2016  Depression screen [facility] 2/9 07/14/2020 10/11/2019 10/13/2018  Decreased Interest 2 0 0  Down, Depressed, Hopeless 3 0 0  PHQ - 2 Score 5 0 0  Altered sleeping 3  3 0  Tired, decreased energy 2 1 0  Change in appetite 3 0 2  Feeling bad or failure about yourself  1 0 0  Trouble concentrating 0 0 0  Moving slowly or fidgety/restless 0 0 0  Suicidal thoughts 0 0 0  PHQ-9 Score _0 Difficult doing work/chores - Very difficult Not difficult at all   GAD 7 : Generalized Anxiety Score 07/14/2020 10/11/2019 10/13/2018 07/18/2017  Nervous, Anxious, on Edge 2 0 0 0  Control/stop worrying 2 0 0 0  Worry too much - different things 2 2 0 0  Trouble relaxing  1 0 0 0  Restless 0 0 0 0  Easily annoyed or irritable 3 0 0 0  Afraid - awful might happen 1 0 0 0  Total GAD 7 Score 11 2 0 0  Anxiety Difficulty - Not difficult at all Not difficult at all -    Allergies  Allergen Reactions  . Aspirin     hives   Social History   Tobacco Use  . Smoking status: Never Smoker  . Smokeless tobacco: Never Used  Substance Use Topics  . Alcohol use: No   Past Medical History:  Diagnosis Date  . Allergy   . 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 Anxiety   . Asthma   . Chicken pox   . Depression, major   . GERD (gastroesophageal reflux disease)   . Hypertension   . Internal hemorrhoids    rectal bleeding, colonoscopy  . Migraine   . OSA on CPAP 04/2012   AHI23  . RAD (reactive airway disease)    ? asthma, normal studies, inhaler use  . Snoring   . 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 EYE SURGERY     lazy eye   Family History  Problem Relation Age of Onset  . Diabetes Mother   . Diabetes Father   . Early death Father        heart attack 15  . Breast cancer Sister   . Liver cancer Sister   . Mental illness Brother    Allergies as of 07/14/2020      Reactions   Aspirin    hives      Medication List       Accurate as of July 14, 2020  4:41 PM. If you have any questions, ask your nurse or doctor.        STOP taking these medications   benzonatate 100 MG capsule Commonly known as: Best boy Stopped by: Howard Pouch, DO     TAKE these medications   Accu-Chek Guide test strip Generic drug: glucose blood USE TO CHECK FASTING BLOOD SUGAR DAILY   accu-chek soft touch lancets Use to check sugars 3x daily ICD-10 E11.9   Albuterol Sulfate 108 (90 Base) MCG/ACT Aepb Commonly known as: ProAir RespiClick Inhale 2 puffs into the lungs every 4 (four) hours as needed.   atorvastatin 20 MG tablet Commonly known as:  LIPITOR Take 1 tablet (20 mg) by mouth daily.   blood glucose meter kit and supplies Dispense based on patient and insurance preference. Use daily to check fasting blood glucose  (FOR ICD-10 E11.9).   cetirizine 10 MG tablet Commonly known as: ZYRTEC Take 10 mg by mouth daily.   DULoxetine 30 MG capsule Commonly known as: Cymbalta Take 1 capsule (30 mg total) by  mouth daily. Started by: Howard Pouch, DO   gabapentin 400 MG capsule Commonly known as: Neurontin Take 1 capsule (400 mg total) by mouth 3 (three) times daily. What changed: when to take this Changed by: Howard Pouch, DO   hydrochlorothiazide 25 MG tablet Commonly known as: HYDRODIURIL Take 1 tablet (25 mg total) by mouth daily.   lisinopril 10 MG tablet Commonly known as: ZESTRIL Take 1 tablet (10 mg total) by mouth daily. What changed:   medication strength  how much to take Changed by: Howard Pouch, DO   metFORMIN 1000 MG tablet Commonly known as: GLUCOPHAGE Take 1 tablet (1,000 mg total) by mouth 2 (two) times daily with a meal. What changed: when to take this Changed by: Howard Pouch, DO   sertraline 50 MG tablet Commonly known as: ZOLOFT Take 1 tablet (50 mg total) by mouth at bedtime. What changed:   medication strength  how much to take  when to take this Changed by: Howard Pouch, DO       Results for orders placed or performed in visit on 07/14/20 (from the past 24 hour(s))  POCT HgB A1C     Status: Abnormal   Collection Time: 07/14/20  4:01 PM  Result Value Ref Range   Hemoglobin A1C 6.7 (A) 4.0 - 5.6 %   HbA1c POC (<> result, manual entry) 6.7 4.0 - 5.6 %   HbA1c, POC (prediabetic range) 6.7 (A) 5.7 - 6.4 %   HbA1c, POC (controlled diabetic range) 6.7 0.0 - 7.0 %   No results found.  ROS: Negative, with the exception of above mentioned in HPI  Objective:  BP 134/89   Pulse 74   Temp 98.6 F (37 C) (Oral)   Ht _0  (1.905 m)   Wt (!) 380 lb (172.4 kg)   SpO2 98%   BMI  47.50 kg/m  Body mass index is 47.5 kg/m. Gen: Afebrile. No acute distress. Nontoxic, pleasant obese male.  HENT: AT. [place].  Eyes:Pupils Equal Round Reactive to light, Extraocular movements intact,  Conjunctiva without redness, discharge or icterus. CV: RRR no murmur Chest: CTAB, no wheeze or crackles Abd: Soft. NTND. BS present. no Masses palpated.  Neuro: Normal gait. PERLA. EOMi. Alert. Oriented x3 Psych: Normal affect, dress and demeanor. Normal speech. Normal thought content and judgment.   Diabetic Foot Exam - Simple   No data filed     Results for orders placed or performed in visit on 07/14/20 (from the past 24 hour(s))  POCT HgB A1C     Status: Abnormal   Collection Time: 07/14/20  4:01 PM  Result Value Ref Range   Hemoglobin A1C 6.7 (A) 4.0 - 5.6 %   HbA1c POC (<> result, manual entry) 6.7 4.0 - 5.6 %   HbA1c, POC (prediabetic range) 6.7 (A) 5.7 - 6.4 %   HbA1c, POC (controlled diabetic range) 6.7 0.0 - 7.0 %    Assessment/Plan: Charan Prieto is a 45 y.o. male present for acute OV for increased anxiety. Anxiety with depression -Could use improvement.  PHQ and gad is increased from his usual. -Decrease Zoloft to 50 mg daily -Cymbalta 30 mg daily - f/u 3.5 months  Essential hypertension, benign/elevated Tg/morbid obesity - above goal of 130/80.  - continue  HCTZ 25 mg QD - increase  lisinopril 5 mg > 10 mgqd. - low salt diet. - f/u 3.5 months  Diabetes mellitus ([place]) -Stable - Continue the diet and weight loss -Increase metformin to 1000 mg twice daily. -  Janumet was too expensive PNA series: pneumovax completed  Flu shot: completed  2021(recommneded yearly) Foot exam: 01/2020 Eye exam: completed 06/2020 Malb: will collect next visit. A1c: 9.5>5.8> 5.8>6.3> 6.7 today F/u 3.5 mos  Lumbar radiculopathy -Could use more coverage - Xray completed 11/2018.Mild multilevel degenerative disc disease with small anterior osteophytes. No fracture or traumatic  malalignment. -Trial of Elavil; was not helpful.  -Increase gabapentin to 3 times daily -Also added Cymbalta   Orders Placed This Encounter  Procedures  . POCT HgB A1C   Meds ordered this encounter  Medications  . atorvastatin (LIPITOR) 20 MG tablet    Sig: Take 1 tablet (20 mg) by mouth daily.    Dispense:  90 tablet    Refill:  3    Needs f/u appt  . gabapentin (NEURONTIN) 400 MG capsule    Sig: Take 1 capsule (400 mg total) by mouth 3 (three) times daily.    Dispense:  270 capsule    Refill:  1  . hydrochlorothiazide (HYDRODIURIL) 25 MG tablet    Sig: Take 1 tablet (25 mg total) by mouth daily.    Dispense:  90 tablet    Refill:  1  . lisinopril (ZESTRIL) 10 MG tablet    Sig: Take 1 tablet (10 mg total) by mouth daily.    Dispense:  90 tablet    Refill:  1  . metFORMIN (GLUCOPHAGE) 1000 MG tablet    Sig: Take 1 tablet (1,000 mg total) by mouth 2 (two) times daily with a meal.    Dispense:  180 tablet    Refill:  1  . sertraline (ZOLOFT) 50 MG tablet    Sig: Take 1 tablet (50 mg total) by mouth at bedtime.    Dispense:  90 tablet    Refill:  1  . DULoxetine (CYMBALTA) 30 MG capsule    Sig: Take 1 capsule (30 mg total) by mouth daily.    Dispense:  90 capsule    Refill:  1   Referral Orders  No referral(s) requested today    electronically signed by:  Howard Pouch, DO  [place]

## 2020-07-27 ENCOUNTER — Encounter: Payer: Self-pay | Admitting: Family Medicine

## 2020-07-28 NOTE — Telephone Encounter (Signed)
Patient was seen 2 weeks ago and we added Cymbalta daily to his regimen and increased his gabapentin to 3 times daily. It may take another week to 2 weeks to see the benefits of adding Cymbalta to his regimen.  However if it is worsening and he has concerns, I would encourage him to follow-up for his worsening back pain so that we can pursue further evaluation.

## 2020-08-21 ENCOUNTER — Telehealth: Payer: Self-pay | Admitting: Family Medicine

## 2020-08-21 NOTE — Telephone Encounter (Signed)
Received faxed forms from [employer]. Placed in Dr. Lucita Lora inbox in front office, to be filled out.

## 2020-08-22 NOTE — Telephone Encounter (Signed)
[employer]'s form received for work. Pt has upcoming appt tomorrow for LBP.   Will place on PCP desk to review

## 2020-08-23 ENCOUNTER — Ambulatory Visit (INDEPENDENT_AMBULATORY_CARE_PROVIDER_SITE_OTHER): Payer: BC Managed Care – PPO | Admitting: Family Medicine

## 2020-08-23 ENCOUNTER — Encounter: Payer: Self-pay | Admitting: Family Medicine

## 2020-08-23 ENCOUNTER — Other Ambulatory Visit: Payer: Self-pay

## 2020-08-23 DIAGNOSIS — M5416 Radiculopathy, lumbar region: Secondary | ICD-10-CM | POA: Diagnosis not present

## 2020-08-23 MED ORDER — PREGABALIN 150 MG PO CAPS: ORAL_CAPSULE | ORAL | 5 refills | Status: DC

## 2020-08-23 MED ORDER — MELOXICAM 15 MG PO TABS: 15.0000 mg | ORAL_TABLET | Freq: Every day | ORAL | 5 refills | Status: DC

## 2020-08-23 NOTE — Patient Instructions (Signed)
Continue Cymbalta.  Start lyrica - follow label. STOP gabapentin Start mobic daily (once daily)  I have referred you to PT.     Sciatica  Sciatica is pain, weakness, tingling, or loss of feeling (numbness) along the sciatic nerve. The sciatic nerve starts in the lower back and goes down the back of each leg. Sciatica usually goes away on its own or with treatment. Sometimes, sciatica may come back (recur). What are the causes? This condition happens when the sciatic nerve is pinched or has pressure put on it. This may be the result of:  A disk in between the bones of the spine bulging out too far (herniated disk).  Changes in the spinal disks that occur with aging.  A condition that affects a muscle in the butt.  Extra bone growth near the sciatic nerve.  A break (fracture) of the area between your hip bones (pelvis).  Pregnancy.  Tumor. This is rare. What increases the risk? You are more likely to develop this condition if you:  Play sports that put pressure or stress on the spine.  Have poor strength and ease of movement (flexibility).  Have had a back injury in the past.  Have had back surgery.  Sit for long periods of time.  Do activities that involve bending or lifting over and over again.  Are very overweight (obese). What are the signs or symptoms? Symptoms can vary from mild to very bad. They may include:  Any of these problems in the lower back, leg, hip, or butt: ? Mild tingling, loss of feeling, or dull aches. ? Burning sensations. ? Sharp pains.  Loss of feeling in the back of the calf or the sole of the foot.  Leg weakness.  Very bad back pain that makes it hard to move. These symptoms may get worse when you cough, sneeze, or laugh. They may also get worse when you sit or stand for long periods of time. How is this treated? This condition often gets better without any treatment. However, treatment may include:  Changing or cutting back on  physical activity when you have pain.  Doing exercises and stretching.  Putting ice or heat on the affected area.  Medicines that help: ? To relieve pain and swelling. ? To relax your muscles.  Shots (injections) of medicines that help to relieve pain, irritation, and swelling.  Surgery. Follow these instructions at home: Medicines  Take over-the-counter and prescription medicines only as told by your doctor.  Ask your doctor if the medicine prescribed to you: ? Requires you to avoid driving or using heavy machinery. ? Can cause trouble pooping (constipation). You may need to take these steps to prevent or treat trouble pooping:  Drink enough fluids to keep your pee (urine) pale yellow.  Take over-the-counter or prescription medicines.  Eat foods that are high in fiber. These include beans, whole grains, and fresh fruits and vegetables.  Limit foods that are high in fat and sugar. These include fried or sweet foods. Managing pain  If told, put ice on the affected area. ? Put ice in a plastic bag. ? Place a towel between your skin and the bag. ? Leave the ice on for 20 minutes, 2-3 times a day.  If told, put heat on the affected area. Use the heat source that your doctor tells you to use, such as a moist heat pack or a heating pad. ? Place a towel between your skin and the heat source. ? Leave the  heat on for 20-30 minutes. ? Remove the heat if your skin turns bright red. This is very important if you are unable to feel pain, heat, or cold. You may have a greater risk of getting burned.      Activity  Return to your normal activities as told by your doctor. Ask your doctor what activities are safe for you.  Avoid activities that make your symptoms worse.  Take short rests during the day. ? When you rest for a long time, do some physical activity or stretching between periods of rest. ? Avoid sitting for a long time without moving. Get up and move around at least one  time each hour.  Exercise and stretch regularly, as told by your doctor.  Do not lift anything that is heavier than 10 lb (4.5 kg) while you have symptoms of sciatica. ? Avoid lifting heavy things even when you do not have symptoms. ? Avoid lifting heavy things over and over.  When you lift objects, always lift in a way that is safe for your body. To do this, you should: ? Bend your knees. ? Keep the object close to your body. ? Avoid twisting.   General instructions  Stay at a healthy weight.  Wear comfortable shoes that support your feet. Avoid wearing high heels.  Avoid sleeping on a mattress that is too soft or too hard. You might have less pain if you sleep on a mattress that is firm enough to support your back.  Keep all follow-up visits as told by your doctor. This is important. Contact a doctor if:  You have pain that: ? Wakes you up when you are sleeping. ? Gets worse when you lie down. ? Is worse than the pain you have had in the past. ? Lasts longer than 4 weeks.  You lose weight without trying. Get help right away if:  You cannot control when you pee (urinate) or poop (have a bowel movement).  You have weakness in any of these areas and it gets worse: ? Lower back. ? The area between your hip bones. ? Butt. ? Legs.  You have redness or swelling of your back.  You have a burning feeling when you pee. Summary  Sciatica is pain, weakness, tingling, or loss of feeling (numbness) along the sciatic nerve.  This condition happens when the sciatic nerve is pinched or has pressure put on it.  Sciatica can cause pain, tingling, or loss of feeling (numbness) in the lower back, legs, hips, and butt.  Treatment often includes rest, exercise, medicines, and putting ice or heat on the affected area. This information is not intended to replace advice given to you by your health care provider. Make sure you discuss any questions you have with your health care  provider. Document Revised: 06/08/2018 Document Reviewed: 06/08/2018 Elsevier Patient Education  [place].

## 2020-08-23 NOTE — Progress Notes (Signed)
This visit occurred during the SARS-CoV-2 public health emergency.  Safety protocols were in place, including screening questions prior to the visit, additional usage of staff PPE, and extensive cleaning of exam room while observing appropriate contact time as indicated for disinfecting solutions.    Bain Whichard , 1975-08-09, 45 y.o., male MRN: [id number] Patient Care Team    Relationship Specialty Notifications Start End  [name], DO PCP - General Family Medicine  01/03/16     Chief Complaint  Patient presents with  . Back Pain     Subjective: Pt presents for an OV with complaints of  Back pain:Pt reports his lumbar back pain is worsening. He has sharp pains when laying flat in the bed. When laying on his sides he experiences pain that can radiate down his right leg to his calf.  2 years ago he noted back pain and was seen, with xray resulting in DDD and small osteophytes of lumbar spine. He understands his weight is a factor in his back pain. He felt the Cymbalta did help some. He has tried to double up on gabapentin, but is unable to take every 8 hours at doses needed to help 2/2 to sedation. He takes tylenol which he does not feel is helpful. He is allergic to ASA(hives), but able to take IBf and naproxen.  He has forms for handicap playcard and an accomodation form(s) for his employer to allow to keep his lumbar support chair and hopefully also be able to use a standing desk intermittently throughout the day.   Prior note:  Patient reports he is having discomfort in his lower back that is creating pain in his anterior thighs when laying flat. He states that the discomfort from the waist down. He experienced this in the past and was placed on gabapentin and told it was secondary to his obesity. He was unable to tolerate using low dose of gabapentin secondary to sedation. He reports had resolved and only returned within the last week. He has been recently diagnosed with  diabetes and started on metformin. He states he has been doing very well following a low sugar lower carbohydrate diet and has lost weight. He denies any bladder or bowel dysfunction. He does not recall if he has had an x-ray of his lumbar spine in the past. No x-rays were available in EMR or care everywhere tab to review. He reports a few years ago when this occurred it was mostly on his right side. He states now it is mostly on his left side but does endorse bilateral discomfort.   DG Lumbar Spine Complete Result Date: 11/11/2018 CLINICAL DATA:  Pain radiating into bilateral lower extremities. No older recent trauma. EXAM: LUMBAR SPINE - COMPLETE 4+ VIEW COMPARISON:  None. FINDINGS: Mild multilevel degenerative disc disease with small anterior osteophytes. No fracture or traumatic malalignment. IMPRESSION: Mild degenerative disc disease. Electronically Signed   By: Dorise Bullion III M.D   On: 11/11/2018 17:23   Depression screen [facility] 2/9 07/14/2020 10/11/2019 10/13/2018 04/08/2018 12/09/2017  Decreased Interest 2 0 0 0 0  Down, Depressed, Hopeless 3 0 0 0 0  PHQ - 2 Score 5 0 0 0 0  Altered sleeping 3 3 0 0 0  Tired, decreased energy 2 1 0 1 0  Change in appetite 3 0 2 0 0  Feeling bad or failure about yourself  1 0 0 0 0  Trouble concentrating 0 0 0 0 -  Moving slowly or  fidgety/restless 0 0 0 0 0  Suicidal thoughts 0 0 0 0 0  PHQ-9 Score '14 4 2 1 ' 0  Difficult doing work/chores - Very difficult Not difficult at all Not difficult at all -    Allergies  Allergen Reactions  . Aspirin     hives   Social History   Social History Narrative   Married to [employer] [place]). Has one child named [name] (special needs).    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    Denies tobacco, drugs or etoh.    Drinks caffeine. Takes a daily vitamin.    Wears seatbelt. Smoke detector in the home.    Exercises routinely.    Feels safe in his relationships    Past Medical History:  Diagnosis Date   . Allergy   . 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 Anxiety   . Asthma   . Chicken pox   . Depression, major   . GERD (gastroesophageal reflux disease)   . Hypertension   . Internal hemorrhoids    rectal bleeding, colonoscopy  . Migraine   . OSA on CPAP 04/2012   AHI23  . RAD (reactive airway disease)    ? asthma, normal studies, inhaler use  . Snoring   . 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 EYE SURGERY     lazy eye   Family History  Problem Relation Age of Onset  . Diabetes Mother   . Diabetes Father   . Early death Father        heart attack 28  . Breast cancer Sister   . Liver cancer Sister   . Mental illness Brother    Allergies as of 08/23/2020      Reactions   Aspirin    hives      Medication List       Accurate as of August 23, 2020 11:59 PM. If you have any questions, ask your nurse or doctor.        STOP taking these medications   gabapentin 400 MG capsule Commonly known as: Neurontin Stopped by: Howard Pouch, DO   sertraline 50 MG tablet Commonly known as: ZOLOFT Stopped by: Howard Pouch, DO     TAKE these medications   Accu-Chek Guide test strip Generic drug: glucose blood USE TO CHECK FASTING BLOOD SUGAR DAILY   accu-chek soft touch lancets Use to check sugars 3x daily ICD-10 E11.9   Albuterol Sulfate 108 (90 Base) MCG/ACT Aepb Commonly known as: ProAir RespiClick Inhale 2 puffs into the lungs every 4 (four) hours as needed.   atorvastatin 20 MG tablet Commonly known as: LIPITOR Take 1 tablet (20 mg) by mouth daily.   blood glucose meter kit and supplies Dispense based on patient and insurance preference. Use daily to check fasting blood glucose  (FOR ICD-10 E11.9).   cetirizine 10 MG tablet Commonly known as: ZYRTEC Take 10 mg by mouth daily.   DULoxetine 30 MG capsule Commonly known as: Cymbalta Take 1 capsule (30 mg total)  by mouth daily.   hydrochlorothiazide 25 MG tablet Commonly known as: HYDRODIURIL Take 1 tablet (25 mg total) by mouth daily.   lisinopril 10 MG tablet Commonly known as: ZESTRIL Take 1 tablet (10 mg total) by mouth daily.   meloxicam 15 MG tablet Commonly known as: MOBIC Take 1 tablet (15 mg total) by mouth daily. Started by:  Belmont Valli, DO   metFORMIN 1000 MG tablet Commonly known as: GLUCOPHAGE Take 1 tablet (1,000 mg total) by mouth 2 (two) times daily with a meal.   pregabalin 150 MG capsule Commonly known as: LYRICA 1 cap PO in the morning for 7 days, then increase to BID Started by: Howard Pouch, DO       All past medical history, surgical history, allergies, family history, immunizations andmedications were updated in the EMR today and reviewed under the history and medication portions of their EMR.     ROS: Negative, with the exception of above mentioned in HPI   Objective:  BP (!) 138/92   Pulse 84   Temp 98.2 F (36.8 C)   Wt (!) 380 lb 3.2 oz (172.5 kg)   SpO2 98%   BMI 47.52 kg/m  Body mass index is 47.52 kg/m. Gen: Afebrile. No acute distress. Nontoxic in appearance, well developed, well nourished.  HENT: AT. [place].  Eyes:Pupils Equal Round Reactive to light, Extraocular movements intact,  Conjunctiva without redness, discharge or icterus. Neck/lymp/endocrine: Supple,no lymphadenopathy CV: RRR Neuro: Normal gait. PERLA. EOMi. Alert. Oriented x3  Psych: Normal affect, dress and demeanor. Normal speech. Normal thought content and judgment.  No exam data present No results found. No results found for this or any previous visit (from the past 24 hour(s)).  Assessment/Plan: Marquelle Musgrave is a 45 y.o. male present for OV for  Lumbar radiculopathy/back pain/obesity/form completion - pain is worsening over last few  months - Xray completed 11/2018.Mild multilevel degenerative disc disease with small anterior osteophytes. No fracture or traumatic  malalignment. -Accommodation form for employer completed today. -Trial of Elavil; was not helpful.  -Discontinue gabapentin -Start Lyrica taper up to 150 mg daily. -Start Mobic 15 mg daily with food.  Patient is allergic to aspirin but reports he has no reaction when using ibuprofen or naproxen. -Continue Cymbalta 80 mg daily.  Can consider increasing at next appointment if needed only. -Discussed other treatment modalities such as physical therapy, OMT, referral to specialty for further evaluation including spinal injections and MRI.  At this time he would like to try physical therapy and medications.  Reviewed expectations re: course of current medical issues.  Discussed self-management of symptoms.  Outlined signs and symptoms indicating need for more acute intervention.  Patient verbalized understanding and all questions were answered.  Patient received an After-Visit Summary.    Orders Placed This Encounter  Procedures  . Ambulatory referral to Physical Therapy   Meds ordered this encounter  Medications  . pregabalin (LYRICA) 150 MG capsule    Sig: 1 cap PO in the morning for 7 days, then increase to BID    Dispense:  60 capsule    Refill:  5    DC gabapentin  . meloxicam (MOBIC) 15 MG tablet    Sig: Take 1 tablet (15 mg total) by mouth daily.    Dispense:  30 tablet    Refill:  5    Referral Orders     Ambulatory referral to Physical Therapy   Note is dictated utilizing voice recognition software. Although note has been proof read prior to signing, occasional typographical errors still can be missed. If any questions arise, please do not hesitate to call for verification.   electronically signed by:  Howard Pouch, DO  [place]

## 2020-08-24 NOTE — Telephone Encounter (Signed)
Accommodation form completed for patient and placed in [facility] work basket.  Please inform patient have completion and fax forms to employer

## 2020-08-24 NOTE — Telephone Encounter (Signed)
Pt aware and form faxed

## 2020-09-05 DIAGNOSIS — R531 Weakness: Secondary | ICD-10-CM | POA: Diagnosis not present

## 2020-09-05 DIAGNOSIS — M5459 Other low back pain: Secondary | ICD-10-CM | POA: Diagnosis not present

## 2020-09-07 ENCOUNTER — Other Ambulatory Visit: Payer: Self-pay | Admitting: Family Medicine

## 2020-09-07 DIAGNOSIS — I10 Essential (primary) hypertension: Secondary | ICD-10-CM

## 2020-09-11 ENCOUNTER — Encounter: Payer: Self-pay | Admitting: Family Medicine

## 2020-10-11 ENCOUNTER — Encounter: Payer: Self-pay | Admitting: Family Medicine

## 2020-10-11 ENCOUNTER — Other Ambulatory Visit: Payer: Self-pay

## 2020-10-11 ENCOUNTER — Ambulatory Visit (INDEPENDENT_AMBULATORY_CARE_PROVIDER_SITE_OTHER): Payer: BC Managed Care – PPO | Admitting: Family Medicine

## 2020-10-11 VITALS — BP 125/82 | HR 78 | Temp 98.3°F | Ht 75.0 in | Wt 389.0 lb

## 2020-10-11 DIAGNOSIS — E785 Hyperlipidemia, unspecified: Secondary | ICD-10-CM

## 2020-10-11 DIAGNOSIS — E1169 Type 2 diabetes mellitus with other specified complication: Secondary | ICD-10-CM

## 2020-10-11 DIAGNOSIS — E119 Type 2 diabetes mellitus without complications: Secondary | ICD-10-CM

## 2020-10-11 DIAGNOSIS — M5416 Radiculopathy, lumbar region: Secondary | ICD-10-CM | POA: Diagnosis not present

## 2020-10-11 DIAGNOSIS — I10 Essential (primary) hypertension: Secondary | ICD-10-CM

## 2020-10-11 DIAGNOSIS — F418 Other specified anxiety disorders: Secondary | ICD-10-CM

## 2020-10-11 DIAGNOSIS — Z1211 Encounter for screening for malignant neoplasm of colon: Secondary | ICD-10-CM

## 2020-10-11 DIAGNOSIS — Z9989 Dependence on other enabling machines and devices: Secondary | ICD-10-CM

## 2020-10-11 DIAGNOSIS — G4733 Obstructive sleep apnea (adult) (pediatric): Secondary | ICD-10-CM

## 2020-10-11 DIAGNOSIS — E781 Pure hyperglyceridemia: Secondary | ICD-10-CM | POA: Diagnosis not present

## 2020-10-11 LAB — POCT GLYCOSYLATED HEMOGLOBIN (HGB A1C)
HbA1c POC (<> result, manual entry): 6.4 % (ref 4.0–5.6)
HbA1c, POC (controlled diabetic range): 6.4 % (ref 0.0–7.0)
HbA1c, POC (prediabetic range): 6.4 % (ref 5.7–6.4)
Hemoglobin A1C: 6.4 % — AB (ref 4.0–5.6)

## 2020-10-11 MED ORDER — HYDROCHLOROTHIAZIDE 25 MG PO TABS
25.0000 mg | ORAL_TABLET | Freq: Every day | ORAL | 1 refills | Status: DC
Start: 2020-10-11 — End: 2021-02-21

## 2020-10-11 MED ORDER — PREGABALIN 150 MG PO CAPS: ORAL_CAPSULE | ORAL | 1 refills | Status: DC

## 2020-10-11 MED ORDER — DULOXETINE HCL 30 MG PO CPEP: 30.0000 mg | ORAL_CAPSULE | Freq: Every day | ORAL | 1 refills | Status: DC

## 2020-10-11 MED ORDER — METFORMIN HCL 1000 MG PO TABS: 1000.0000 mg | ORAL_TABLET | Freq: Two times a day (BID) | ORAL | 1 refills | Status: DC

## 2020-10-11 MED ORDER — LISINOPRIL 10 MG PO TABS: 10.0000 mg | ORAL_TABLET | Freq: Every day | ORAL | 1 refills | Status: DC

## 2020-10-11 NOTE — Patient Instructions (Signed)
Great to see you today.  I have refilled the medication(s) we provide.   If labs were collected, we will inform you of lab results once received either by echart message or telephone call.   - echart message- for normal results that have been seen by the patient already.   - telephone call: abnormal results or if patient has not viewed results in their echart.  

## 2020-10-11 NOTE — Progress Notes (Signed)
This visit occurred during the SARS-CoV-2 public health emergency.  Safety protocols were in place, including screening questions prior to the visit, additional usage of staff PPE, and extensive cleaning of exam room while observing appropriate contact time as indicated for disinfecting solutions.    Christopher Ingram , 01-19-1976, 45 y.o., male MRN: [id number] Patient Care Team    Relationship Specialty Notifications Start End  Christopher Hillock, DO PCP - General Family Medicine  01/03/16   Christopher Mires, MD Consulting Physician Pulmonary Disease  10/11/20     Chief Complaint  Patient presents with  . Follow-up    CMC; pt is fasting  . Diabetes  . Back Pain     Subjective: Pt presents for an OV  For [facility] Depression/anxiety: Patient reports compliance cymbalta 30 mg daily and feels the medicine is working great. Tired meds in the past: zoloft. prozac   Hypertension/morbid obesity/elevated tg : Pt reports compliance with his HCTZ 25 mg and lisinopril 10 mg. He is also eating a low salt diet. Patient denies chest pain, shortness of breath, dizziness or lower extremity edema.  He is tolerating statin.  Diet: low sodium, low carb Exercise: routinely RF: HTN, HLD, DM, obesity, FH HD (father MI 74)  Back pain: He reports great improvement in back pain with start of cymbalta and lyrica. PT went well.  Prior note: Pt reports his lumbar back pain is worsening. He has sharp pains when laying flat in the bed. When laying on his sides he experiences pain that can radiate down his right leg to his calf.  2 years ago he noted back pain and was seen, with xray resulting in DDD and small osteophytes of lumbar spine. He understands his weight is a factor in his back pain. He felt the Cymbalta did help some. He has tried to double up on gabapentin, but is unable to take every 8 hours at doses needed to help 2/2 to sedation. He takes tylenol which he does not feel is helpful. He is allergic to ASA(hives),  but able to take IBf and naproxen.  He has forms for handicap playcard and an accomodation form(s) for his employer to allow to keep his lumbar support chair and hopefully also be able to use a standing desk intermittently throughout the day.   Prior note:  Patient reports he is having discomfort in his lower back that is creating pain in his anterior thighs when laying flat. He states that the discomfort from the waist down. He experienced this in the past and was placed on gabapentin and told it was secondary to his obesity. He was unable to tolerate using low dose of gabapentin secondary to sedation. He reports had resolved and only returned within the last week. He has been recently diagnosed with diabetes and started on metformin. He states he has been doing very well following a low sugar lower carbohydrate diet and has lost weight. He denies any bladder or bowel dysfunction. He does not recall if he has had an x-ray of his lumbar spine in the past. No x-rays were available in EMR or care everywhere tab to review. He reports a few years ago when this occurred it was mostly on his right side. He states now it is mostly on his left side but does endorse bilateral discomfort.   DG Lumbar Spine Complete Result Date: 11/11/2018 CLINICAL DATA:  Pain radiating into bilateral lower extremities. No older recent trauma. EXAM: LUMBAR SPINE - COMPLETE 4+ VIEW  COMPARISON:  None. FINDINGS: Mild multilevel degenerative disc disease with small anterior osteophytes. No fracture or traumatic malalignment. IMPRESSION: Mild degenerative disc disease. Electronically Signed   By: Dorise Bullion III M.D   On: 11/11/2018 17:23   Depression screen [facility] 2/9 10/11/2020 07/14/2020 10/11/2019 10/13/2018 04/08/2018  Decreased Interest 0 2 0 0 0  Down, Depressed, Hopeless 0 3 0 0 0  PHQ - 2 Score 0 5 0 0 0  Altered sleeping '1 3 3 ' 0 0  Tired, decreased energy '1 2 1 ' 0 1  Change in appetite 2 3 0 2 0  Feeling bad or  failure about yourself  0 1 0 0 0  Trouble concentrating 0 0 0 0 0  Moving slowly or fidgety/restless 0 0 0 0 0  Suicidal thoughts 0 0 0 0 0  PHQ-9 Score '4 14 4 2 1  ' Difficult doing work/chores - - Very difficult Not difficult at all Not difficult at all  Some recent data might be hidden    Allergies  Allergen Reactions  . Aspirin     hives   Social History   Social History Narrative   Married to [employer] Christopher Ingram). Has one child named Christopher Ingram (special needs).    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    Denies tobacco, drugs or etoh.    Drinks caffeine. Takes a daily vitamin.    Wears seatbelt. Smoke detector in the home.    Exercises routinely.    Feels safe in his relationships    Past Medical History:  Diagnosis Date  . Allergy   . 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 Anxiety   . Asthma   . Chicken pox   . Depression, major   . GERD (gastroesophageal reflux disease)   . Hypertension   . Internal hemorrhoids    rectal bleeding, colonoscopy  . Migraine   . OSA on CPAP 04/2012   AHI23  . RAD (reactive airway disease)    ? asthma, normal studies, inhaler use  . Snoring   . 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 EYE SURGERY     lazy eye   Family History  Problem Relation Age of Onset  . Diabetes Mother   . Diabetes Father   . Early death Father        heart attack 18  . Breast cancer Sister   . Liver cancer Sister   . Mental illness Brother    Allergies as of 10/11/2020      Reactions   Aspirin    hives      Medication List       Accurate as of Oct 11, 2020  9:56 AM. If you have any questions, ask your nurse or doctor.        Accu-Chek Guide test strip Generic drug: glucose blood USE TO CHECK FASTING BLOOD SUGAR DAILY   accu-chek soft touch lancets Use to check sugars 3x daily ICD-10 E11.9   Albuterol Sulfate 108 (90 Base) MCG/ACT  Aepb Commonly known as: ProAir RespiClick Inhale 2 puffs into the lungs every 4 (four) hours as needed.   atorvastatin 20 MG tablet Commonly known as: LIPITOR Take 1 tablet (20 mg) by mouth daily.   blood glucose meter kit and supplies Dispense based on patient and insurance preference. Use daily to check fasting blood glucose  (FOR ICD-10 E11.9).  cetirizine 10 MG tablet Commonly known as: ZYRTEC Take 10 mg by mouth daily.   DULoxetine 30 MG capsule Commonly known as: Cymbalta Take 1 capsule (30 mg total) by mouth daily.   hydrochlorothiazide 25 MG tablet Commonly known as: HYDRODIURIL Take 1 tablet (25 mg total) by mouth daily.   lisinopril 10 MG tablet Commonly known as: ZESTRIL Take 1 tablet (10 mg total) by mouth daily.   meloxicam 15 MG tablet Commonly known as: MOBIC Take 1 tablet (15 mg total) by mouth daily.   metFORMIN 1000 MG tablet Commonly known as: GLUCOPHAGE Take 1 tablet (1,000 mg total) by mouth 2 (two) times daily with a meal.   pregabalin 150 MG capsule Commonly known as: LYRICA 1 cap PO in the morning for 7 days, then increase to BID       All past medical history, surgical history, allergies, family history, immunizations andmedications were updated in the EMR today and reviewed under the history and medication portions of their EMR.     ROS: Negative, with the exception of above mentioned in HPI   Objective:  BP 125/82   Pulse 78   Temp 98.3 F (36.8 C) (Oral)   Ht '6\' 3"'  (1.905 m)   Wt (!) 389 lb (176.4 kg)   SpO2 97%   BMI 48.62 kg/m  Body mass index is 48.62 kg/m. Gen: Afebrile. No acute distress. Nontoxic obese male HENT: AT. [place].  Eyes:Pupils Equal Round Reactive to light, Extraocular movements intact,  Conjunctiva without redness, discharge or icterus. Neck/lymp/endocrine: Supple,no lymphadenopathy, no thyromegaly CV: RRR no murmur, no edema, +2/4 P posterior tibialis pulses Chest: CTAB, no wheeze or crackles Abd: Soft. NTND.  BS present. Neuro:  Normal gait. PERLA. EOMi. Alert. Oriented. x3 Psych: Normal affect, dress and demeanor. Normal speech. Normal thought content and judgment.    No exam data present No results found. Results for orders placed or performed in visit on 10/11/20 (from the past 24 hour(s))  POCT HgB A1C     Status: Abnormal   Collection Time: 10/11/20  9:50 AM  Result Value Ref Range   Hemoglobin A1C 6.4 (A) 4.0 - 5.6 %   HbA1c POC (<> result, manual entry) 6.4 4.0 - 5.6 %   HbA1c, POC (prediabetic range) 6.4 5.7 - 6.4 %   HbA1c, POC (controlled diabetic range) 6.4 0.0 - 7.0 %    Assessment/Plan: Zaire Levesque is a 45 y.o. male present for OV for  Lumbar radiculopathy/back pain/obesity - stable - improved on lyrica - Xray completed 11/2018.Mild multilevel degenerative disc disease with small anterior osteophytes. No fracture or traumatic malalignment. -Accommodation form for employer completed today. -Trial of Elavil; was not helpful.  -DCd gabapentin 07/2020 -continue Lyrica taper up to 150 mg BID- feeling great -continue  Mobic 15 mg daily with food.  Patient is allergic to aspirin but reports he has no reaction when using ibuprofen or naproxen. -Continue Cymbalta 30 mg daily.   Completed PT   Anxiety with depression - doing very well.  -Cymbalta 30 mg daily - f/u 4 months  Essential hypertension, benign/elevated Tg/morbid obesity - stable -  goal of 130/80.  - continue  HCTZ 25 mg QD - continue   lisinopril 10 mg qd. - low salt diet. - f/u 4 months  Diabetes mellitus ([place]) - stable.  - Continue the diet and weight loss - continue metformin to 1000 mg twice daily. - Janumet was too expensive PNA series: pneumovax completed  Flu shot: completed  2021(recommneded yearly) Foot exam: 01/2020 Eye exam: completed 06/2020 [place]: will collect next visit. A1c: 9.5>5.8> 5.8>6.3> 6.7>6.4  today F/u 4 mos     Reviewed expectations re: course of current medical  issues.  Discussed self-management of symptoms.  Outlined signs and symptoms indicating need for more acute intervention.  Patient verbalized understanding and all questions were answered.  Patient received an After-Visit Summary.    Orders Placed This Encounter  Procedures  . Ambulatory referral to Gastroenterology  . POCT HgB A1C   Meds ordered this encounter  Medications  . metFORMIN (GLUCOPHAGE) 1000 MG tablet    Sig: Take 1 tablet (1,000 mg total) by mouth 2 (two) times daily with a meal.    Dispense:  180 tablet    Refill:  1  . lisinopril (ZESTRIL) 10 MG tablet    Sig: Take 1 tablet (10 mg total) by mouth daily.    Dispense:  90 tablet    Refill:  1  . hydrochlorothiazide (HYDRODIURIL) 25 MG tablet    Sig: Take 1 tablet (25 mg total) by mouth daily.    Dispense:  90 tablet    Refill:  1  . DULoxetine (CYMBALTA) 30 MG capsule    Sig: Take 1 capsule (30 mg total) by mouth daily.    Dispense:  90 capsule    Refill:  1  . pregabalin (LYRICA) 150 MG capsule    Sig: 1 cap PO in the morning for 7 days, then increase to BID    Dispense:  180 capsule    Refill:  1    DC prior scripts for this med. Also DC gabapentin and zoloft. Thanks.    Referral Orders     Ambulatory referral to Gastroenterology   Note is dictated utilizing voice recognition software. Although note has been proof read prior to signing, occasional typographical errors still can be missed. If any questions arise, please do not hesitate to call for verification.   electronically signed by:  Howard Pouch, DO  [place]

## 2020-10-18 ENCOUNTER — Encounter: Payer: Self-pay | Admitting: Gastroenterology

## 2020-10-31 ENCOUNTER — Encounter: Payer: Self-pay | Admitting: Gastroenterology

## 2020-10-31 ENCOUNTER — Other Ambulatory Visit (INDEPENDENT_AMBULATORY_CARE_PROVIDER_SITE_OTHER): Payer: BC Managed Care – PPO

## 2020-10-31 ENCOUNTER — Ambulatory Visit (INDEPENDENT_AMBULATORY_CARE_PROVIDER_SITE_OTHER): Payer: BC Managed Care – PPO | Admitting: Gastroenterology

## 2020-10-31 ENCOUNTER — Other Ambulatory Visit: Payer: Self-pay

## 2020-10-31 VITALS — BP 122/80 | HR 88 | Ht 75.0 in | Wt 388.0 lb

## 2020-10-31 DIAGNOSIS — K649 Unspecified hemorrhoids: Secondary | ICD-10-CM | POA: Diagnosis not present

## 2020-10-31 DIAGNOSIS — K625 Hemorrhage of anus and rectum: Secondary | ICD-10-CM | POA: Diagnosis not present

## 2020-10-31 DIAGNOSIS — R140 Abdominal distension (gaseous): Secondary | ICD-10-CM | POA: Diagnosis not present

## 2020-10-31 DIAGNOSIS — Z1211 Encounter for screening for malignant neoplasm of colon: Secondary | ICD-10-CM

## 2020-10-31 LAB — CBC
HCT: 41.1 % (ref 39.0–52.0)
Hemoglobin: 14 g/dL (ref 13.0–17.0)
MCHC: 34.1 g/dL (ref 30.0–36.0)
MCV: 84.9 fl (ref 78.0–100.0)
Platelets: 313 10*3/uL (ref 150.0–400.0)
RBC: 4.84 Mil/uL (ref 4.22–5.81)
RDW: 14.9 % (ref 11.5–15.5)
WBC: 8.6 10*3/uL (ref 4.0–10.5)

## 2020-10-31 MED ORDER — PEG 3350-KCL-NA BICARB-NACL 420 G PO SOLR: 4000.0000 mL | Freq: Once | ORAL | 0 refills | Status: AC

## 2020-10-31 MED ORDER — HYDROCORTISONE ACETATE 25 MG RE SUPP: 25.0000 mg | Freq: Two times a day (BID) | RECTAL | 1 refills | Status: DC

## 2020-10-31 NOTE — Patient Instructions (Addendum)
We have sent the following medications to your pharmacy for you to pick up at your convenience :Anusol, Recticare, Golytely   Anusol -Use nightly for 7 nights , then use other every night until prescription is completed.   You may use Recticare (over the counter) 1-2 times daily if your having rectal pain.    Continue using Fiber Con gummies , if increase in bloating then try a trial of fiber- con pills.   You may use Recticare (over the counter) 1-2 times daily if your having rectal pain.    Due to recent changes in healthcare laws, you may see the results of your imaging and laboratory studies on MyChart before your provider has had a chance to review them.  We understand that in some cases there may be results that are confusing or concerning to you. Not all laboratory results come back in the same time frame and the provider may be waiting for multiple results in order to interpret others.  Please give [place] 48 hours in order for your provider to thoroughly review all the results before contacting the office for clarification of your results.   If you are age 25 or older, your body mass index should be between 23-30. Your Body mass index is 48.5 kg/m. If this is out of the aforementioned range listed, please consider follow up with your Primary Care Provider.  If you are age 85 or younger, your body mass index should be between 19-25. Your Body mass index is 48.5 kg/m. If this is out of the aformentioned range listed, please consider follow up with your Primary Care Provider.   __________________________________________________________  The [place] [facility] providers would like to encourage you to use [facility] to communicate with providers for non-urgent requests or questions.  Due to long hold times on the telephone, sending your provider a message by [facility] may be a faster and more efficient way to get a response.  Please allow 48 business hours for a response.  Please remember that this is for  non-urgent requests.   If you are age 42 or younger, your body mass index should be between 19-25. Your Body mass index is 48.5 kg/m. If this is out of the aformentioned range listed, please consider follow up with your Primary Care Provider.  __________________________________________________________  The  GI providers would like to encourage you to use [facility] to communicate with providers for non-urgent requests or questions.  Due to long hold times on the telephone, sending your provider a message by [facility] may be a faster and more efficient way to get a response.  Please allow 48 business hours for a response.  Please remember that this is for non-urgent requests.   Send mychart message in 4 weeks letting us know how you are doing.   Toileting tips to help with your constipation - Drink at least 64-80 ounces of water/liquid per day. - Establish a time to try to move your bowels every day.  For many people, this is after a cup of coffee or after a meal such as breakfast. - Sit all of the way back on the toilet keeping your back fairly straight and while sitting up, try to rest the tops of your forearms on your upper thighs.   - Raising your feet with a step stool/squatty potty can be helpful to improve the angle that allows your stool to pass through the rectum. - Relax the rectum feeling it bulge toward the toilet water.  If you feel your  rectum raising toward your body, you are contracting rather than relaxing. - Breathe in and slowly exhale. "Belly breath" by expanding your belly towards your belly button. Keep belly expanded as you gently direct pressure down and back to the anus.  A low pitched GRRR sound can assist with increasing intra-abdominal pressure.  - Repeat 3-4 times. If unsuccessful, contract the pelvic floor to restore normal tone and get off the toilet.  Avoid excessive straining. - To reduce excessive wiping by teaching your anus to normally contract, place hands on  outer aspect of knees and resist knee movement outward.  Hold 5-10 second then place hands just inside of knees and resist inward movement of knees.  Hold 5 seconds.  Repeat a few times each way.  Thank you for choosing me and [facility].  Dr. Rush Landmark

## 2020-11-03 ENCOUNTER — Encounter: Payer: Self-pay | Admitting: Gastroenterology

## 2020-11-03 DIAGNOSIS — K625 Hemorrhage of anus and rectum: Secondary | ICD-10-CM

## 2020-11-03 DIAGNOSIS — K649 Unspecified hemorrhoids: Secondary | ICD-10-CM

## 2020-11-03 DIAGNOSIS — R140 Abdominal distension (gaseous): Secondary | ICD-10-CM | POA: Insufficient documentation

## 2020-11-03 DIAGNOSIS — Z1211 Encounter for screening for malignant neoplasm of colon: Secondary | ICD-10-CM | POA: Insufficient documentation

## 2020-11-03 HISTORY — DX: Hemorrhage of anus and rectum: K62.5

## 2020-11-03 HISTORY — DX: Unspecified hemorrhoids: K64.9

## 2020-11-03 NOTE — Progress Notes (Signed)
[place] VISIT   Primary Care Provider [name], DO [address] [place] [address] [phone]  Referring Provider [name], DO [address],  [place] [address] [phone]  Patient Profile: Christopher Ingram is a 45 y.o. male with a pmh significant for obesity, diabetes, hyperlipidemia, hypertension, OSA, RAD, MDD/anxiety, hemorrhoids.  The patient presents to the [facility] for an evaluation and management of problem(s) noted below:  Problem List 1. Rectal bleeding   2. Hemorrhoids, unspecified hemorrhoid type   3. Colon cancer screening   4. Bloating symptom     History of Present Illness This is the patient's first visit to the outpatient [place] clinic.  Describes that over the last 3 months he has had episodes of bright red blood per rectum.  He has since initiated on fiber supplementation with fiber Gummies.  This is helped with the episodes of rectal bleeding.  Rectal bleeding has been noted on the toilet paper but also in the toilet bowl at times.  He does have some anal discomfort that occurs within 30 to 60 minutes of having a bowel movement.  He has had to buy a doughnut to help him sit which has been helpful at times.  Since initiation of his fiber Gummies he has noted an increased amount of bloating as well as flatulence.  Patient does take nonsteroidals on a regular basis.  He has never had an upper or lower endoscopy.  Mother has a history of colon polyps and his sister passed away from liver cancer.  He has a relatively sedentary job being an Engineer, technical sales.  He has had issues with GERD in the past but does not experience issues on a regular basis.  GI Review of Systems Positive as above Negative for current pyrosis, dysphagia, odynophagia, nausea, vomiting, abdominal pain, melena, urgency, incomplete evacuation  Review of Systems General: Denies fevers/chills/weight loss unintentionally HEENT: Denies oral  lesions Cardiovascular: Denies chest pain/palpitations Pulmonary: Denies shortness of breath/nocturnal cough Gastroenterological: See HPI Genitourinary: Denies darkened urine or hematuria Hematological: Denies easy bruising/bleeding Endocrine: Denies temperature intolerance Dermatological: Denies jaundice Psychological: Mood is stable   Medications Current Outpatient Medications  Medication Sig Dispense Refill  . ACCU-CHEK GUIDE test strip USE TO CHECK FASTING BLOOD SUGAR DAILY    . Albuterol Sulfate (PROAIR RESPICLICK) 250 (90 Base) MCG/ACT AEPB Inhale 2 puffs into the lungs every 4 (four) hours as needed. 1 each 0  . atorvastatin (LIPITOR) 20 MG tablet Take 1 tablet (20 mg) by mouth daily. 90 tablet 3  . blood glucose meter kit and supplies Dispense based on patient and insurance preference. Use daily to check fasting blood glucose  (FOR ICD-10 E11.9). 1 each 11  . DULoxetine (CYMBALTA) 30 MG capsule Take 1 capsule (30 mg total) by mouth daily. 90 capsule 1  . hydrochlorothiazide (HYDRODIURIL) 25 MG tablet Take 1 tablet (25 mg total) by mouth daily. 90 tablet 1  . hydrocortisone (ANUSOL-HC) 25 MG suppository Place 1 suppository (25 mg total) rectally every 12 (twelve) hours. 12 suppository 1  . Lancets (ACCU-CHEK SOFT TOUCH) lancets Use to check sugars 3x daily ICD-10 E11.9 90 each 0  . lisinopril (ZESTRIL) 10 MG tablet Take 1 tablet (10 mg total) by mouth daily. 90 tablet 1  . loratadine (CLARITIN) 10 MG tablet Take 10 mg by mouth daily.    . meloxicam (MOBIC) 15 MG tablet Take 1 tablet (15 mg total) by mouth daily. 30 tablet 5  .  metFORMIN (GLUCOPHAGE) 1000 MG tablet Take 1 tablet (1,000 mg total) by mouth 2 (two) times daily with a meal. 180 tablet 1  . pregabalin (LYRICA) 150 MG capsule 1 cap PO in the morning for 7 days, then increase to BID 180 capsule 1   No current facility-administered medications for this visit.    Allergies Allergies  Allergen Reactions  . Aspirin      hives    Histories Past Medical History:  Diagnosis Date  . Allergy   . 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 Anxiety   . Asthma   . Chicken pox   . Depression, major   . GERD (gastroesophageal reflux disease)   . Hypertension   . Internal hemorrhoids    rectal bleeding, colonoscopy  . Migraine   . OSA on CPAP 04/2012   AHI23  . RAD (reactive airway disease)    ? asthma, normal studies, inhaler use  . Snoring   . 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 EYE SURGERY     lazy eye   Social History   Socioeconomic History  . Marital status: Married    Spouse name: Not on file  . Number of children: Not on file  . Years of education: Not on file  . Highest education level: Not on file  Occupational History  . Not on file  Tobacco Use  . Smoking status: Never Smoker  . Smokeless tobacco: Never Used  Vaping Use  . Vaping Use: Never used  Substance and Sexual Activity  . Alcohol use: No  . Drug use: No  . Sexual activity: Yes    Partners: Female    Birth control/protection: None    Comment: married  Other Topics Concern  . Not on file  Social History Narrative   Married to [employer] [facility]). Has one child named [name] (special needs).    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    Denies tobacco, drugs or etoh.    Drinks caffeine. Takes a daily vitamin.    Wears seatbelt. Smoke detector in the home.    Exercises routinely.    Feels safe in his relationships    Social Determinants of Health   Financial Resource Strain: Not on file  Food Insecurity: Not on file  Transportation Needs: Not on file  Physical Activity: Not on file  Stress: Not on file  Social Connections: Not on file  Intimate Partner Violence: Not on file   Family History  Problem Relation Age of Onset  . Diabetes Mother   . Colon polyps Mother   . Diabetes Father   . Early death Father         heart attack 72  . Breast cancer Sister   . Liver cancer Sister   . Mental illness Brother   . Colon cancer Neg Hx   . Esophageal cancer Neg Hx   . Inflammatory bowel disease Neg Hx   . Pancreatic cancer Neg Hx   . Rectal cancer Neg Hx   . Stomach cancer Neg Hx    I have reviewed his medical, social, and family history in detail and updated the electronic medical record as necessary.    PHYSICAL EXAMINATION  BP 122/80   Pulse 88   Ht $R'6\' 3"'dq$  (1.905 m)   Wt (!) 388 lb (176 kg)   BMI 48.50 kg/m  Wt Readings from  Last 3 Encounters:  10/31/20 (!) 388 lb (176 kg)  10/11/20 (!) 389 lb (176.4 kg)  08/23/20 (!) 380 lb 3.2 oz (172.5 kg)  GEN: NAD, appears stated age, doesn't appear chronically ill PSYCH: Cooperative, without pressured speech EYE: Conjunctivae pink, sclerae anicteric ENT: Masked CV: Nontachycardic RESP: No audible wheezing GI: NABS, soft, protuberant abdomen, nontender, without rebound or guarding, unable to appreciate hepatosplenomegaly due to body habitus GU: Perianal exam shows evidence of internal hemorrhoidal disease without anal fissures but digital rectal exam was deferred by patient MSK/EXT: No lower extremity edema SKIN: No jaundice NEURO:  Alert & Oriented x 3, no focal deficits   REVIEW OF DATA  I reviewed the following data at the time of this encounter:  GI Procedures and Studies  No relevant studies to review  Laboratory Studies  Reviewed those in epic  Imaging Studies  No relevant studies to review   ASSESSMENT  Mr. Sedore is a 45 y.o. male with a pmh significant for obesity, diabetes, hyperlipidemia, hypertension, OSA, RAD, MDD/anxiety, hemorrhoids.  The patient is seen today for evaluation and management of:  1. Rectal bleeding   2. Hemorrhoids, unspecified hemorrhoid type   3. Colon cancer screening   4. Bloating symptom    The patient is hemodynamically stable.  Clinically he has had some improvement in his rectal bleeding as a  result of fiber supplementation.  With that being said I suspect his bloating is a result of the fiber Gummies.  We did discuss potentially transitioning to Benefiber or FiberCon and the patient will decide how he wants to approach things depending on how he feels his bloating is doing.  I suspect his rectal bleeding is a result of his internal hemorrhoidal disease but he is also due for colon cancer screening.  Colonoscopy is recommended.  We will give him Anusol suppositories in an effort of trying to help symptoms.  He may use Preparation H ointment/cream 2-3 times daily.  Toileting techniques were discussed with the patient.  Sitz bath's may be performed as well.  We will check a blood count to ensure he has no evidence of anemia require an upper endoscopy as well.  The risks and benefits of endoscopic evaluation were discussed with the patient; these include but are not limited to the risk of perforation, infection, bleeding, missed lesions, lack of diagnosis, severe illness requiring hospitalization, as well as anesthesia and sedation related illnesses.  The patient is agreeable to proceed.  All patient questions were answered to the best of my ability, and the patient agrees to the aforementioned plan of action with follow-up as indicated.   PLAN  CBC to be obtained Proceed with scheduling colonoscopy for colon cancer screening Anusol suppositories - Nightly x1 week - Every other night until completion Preparation H ointment/cream may be used 2-3 times daily Sitz bath's recommended Toileting techniques discussed Continue fiber supplementation and consider transition from fiber Gummies to [place] or Benefiber   Orders Placed This Encounter  Procedures  . CBC  . Ambulatory referral to Gastroenterology    New Prescriptions   HYDROCORTISONE (ANUSOL-HC) 25 MG SUPPOSITORY    Place 1 suppository (25 mg total) rectally every 12 (twelve) hours.   Modified Medications   No medications on file     Planned Follow Up No follow-ups on file.   Total Time in Face-to-Face and in Coordination of Care for patient including independent/personal interpretation/review of prior testing, medical history, examination, medication adjustment, communicating results with the patient directly, and  documentation with the EHR is 45 minutes.   Justice Britain, MD [facility] Office # [phone]

## 2020-11-17 ENCOUNTER — Encounter: Payer: Self-pay | Admitting: Family Medicine

## 2020-11-22 ENCOUNTER — Other Ambulatory Visit: Payer: Self-pay

## 2020-11-22 ENCOUNTER — Encounter: Payer: Self-pay | Admitting: Family Medicine

## 2020-11-22 ENCOUNTER — Ambulatory Visit (INDEPENDENT_AMBULATORY_CARE_PROVIDER_SITE_OTHER): Payer: BC Managed Care – PPO | Admitting: Family Medicine

## 2020-11-22 VITALS — BP 138/84 | HR 92 | Temp 98.2°F | Ht 75.0 in | Wt 384.0 lb

## 2020-11-22 DIAGNOSIS — F418 Other specified anxiety disorders: Secondary | ICD-10-CM | POA: Diagnosis not present

## 2020-11-22 DIAGNOSIS — M5416 Radiculopathy, lumbar region: Secondary | ICD-10-CM

## 2020-11-22 MED ORDER — DULOXETINE HCL 60 MG PO CPEP: 60.0000 mg | ORAL_CAPSULE | Freq: Every day | ORAL | 1 refills | Status: DC

## 2020-11-22 NOTE — Progress Notes (Signed)
This visit occurred during the SARS-CoV-2 public health emergency.  Safety protocols were in place, including screening questions prior to the visit, additional usage of staff PPE, and extensive cleaning of exam room while observing appropriate contact time as indicated for disinfecting solutions.    Christopher Ingram , 02/18/1976, 45 y.o., male MRN: [id number] Patient Care Team    Relationship Specialty Notifications Start End  Christopher Hillock, DO PCP - General Family Medicine  01/03/16   Christopher Mires, MD Consulting Physician Pulmonary Disease  10/11/20     Chief Complaint  Patient presents with   Anxiety     Subjective: Pt presents for an OV  [facility] Depression/anxiety: Patient reports compliance with cymbalta 30 mg daily and feels the medicine is working very well for him, but has noticed an increase in anxiety feelings surrounding work.  He is also tolerating Lyrica 150 mg twice daily, which was prescribed for his back discomfort. Tired meds in the past: zoloft. prozac  Back pain: He reports great improvement in back pain with start of cymbalta and lyrica. PT went well. He has noticed he is back to having more anxiety.  He endorses his back discomfort is still very well controlled on current regimen. Prior note: Pt reports his lumbar back pain is worsening. He has sharp pains when laying flat in the bed. When laying on his sides he experiences pain that can radiate down his right leg to his calf.  2 years ago he noted back pain and was seen, with xray resulting in DDD and small osteophytes of lumbar spine. He understands his weight is a factor in his back pain. He felt the Cymbalta did help some. He has tried to double up on gabapentin, but is unable to take every 8 hours at doses needed to help 2/2 to sedation. He takes tylenol which he does not feel is helpful. He is allergic to ASA(hives), but able to take IBf and naproxen.  He has forms for handicap playcard and an accomodation form(s)  for his employer to allow to keep his lumbar support chair and hopefully also be able to use a standing desk intermittently throughout the day.   Prior note:  Patient reports he is having discomfort in his lower back that is creating pain in his anterior thighs when laying flat.  He states that the discomfort from the waist down.  He experienced this in the past and was placed on gabapentin and told it was secondary to his obesity.  He was unable to tolerate using low dose of gabapentin secondary to sedation.  He reports had resolved and only returned within the last week.  He has been recently diagnosed with diabetes and started on metformin.  He states he has been doing very well following a low sugar lower carbohydrate diet and has lost weight.  He denies any bladder or bowel dysfunction.  He does not recall if he has had an x-ray of his lumbar spine in the past.  No x-rays were available in EMR or care everywhere tab to review.  He reports a few years ago when this occurred it was mostly on his right side.  He states now it is mostly on his left side but does endorse bilateral discomfort.   DG Lumbar Spine Complete Result Date: 11/11/2018 CLINICAL DATA:  Pain radiating into bilateral lower extremities. No older recent trauma. EXAM: LUMBAR SPINE - COMPLETE 4+ VIEW COMPARISON:  None. FINDINGS: Mild multilevel degenerative disc disease with  small anterior osteophytes. No fracture or traumatic malalignment. IMPRESSION: Mild degenerative disc disease. Electronically Signed   By: Christopher Ingram M.D   On: 11/11/2018 17:23   Depression screen [facility] 2/9 11/22/2020 10/11/2020 07/14/2020 10/11/2019 10/13/2018  Decreased Interest 1 0 2 0 0  Down, Depressed, Hopeless 2 0 3 0 0  PHQ - 2 Score 3 0 5 0 0  Altered sleeping '1 1 3 3 ' 0  Tired, decreased energy '2 1 2 1 ' 0  Change in appetite '3 2 3 ' 0 2  Feeling bad or failure about yourself  0 0 1 0 0  Trouble concentrating 1 0 0 0 0  Moving slowly or fidgety/restless 0  0 0 0 0  Suicidal thoughts 0 0 0 0 0  PHQ-9 Score '10 4 14 4 2  ' Difficult doing work/chores - - - Very difficult Not difficult at all  Some recent data might be hidden    Allergies  Allergen Reactions   Aspirin     hives   Social History   Social History Narrative   Married to Christopher Ingram Hamilton). Has one child named Christopher Ingram (special needs).    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    Denies tobacco, drugs or etoh.    Drinks caffeine. Takes a daily vitamin.    Wears seatbelt. Smoke detector in the home.    Exercises routinely.    Feels safe in his relationships    Past Medical History:  Diagnosis Date   Allergy    Anterior femoral cutaneous neuropathy of right lower extremity    Anxiety    Asthma    Chicken pox    Depression, major    GERD (gastroesophageal reflux disease)    Hypertension    Internal hemorrhoids    rectal bleeding, colonoscopy   Migraine    OSA on CPAP 04/2012   AHI23   RAD (reactive airway disease)    ? 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 [facility]   Past Surgical History:  Procedure Laterality Date   EYE SURGERY     lazy eye   Family History  Problem Relation Age of Onset   Diabetes Mother    Colon polyps Mother    Diabetes Father    Early death Father        heart attack 75   Breast cancer Sister    Liver cancer Sister    Mental illness Brother    Colon cancer Neg Hx    Esophageal cancer Neg Hx    Inflammatory bowel disease Neg Hx    Pancreatic cancer Neg Hx    Rectal cancer Neg Hx    Stomach cancer Neg Hx    Allergies as of 11/22/2020       Reactions   Aspirin    hives        Medication List        Accurate as of November 22, 2020  5:40 PM. If you have any questions, ask your nurse or doctor.          Accu-Chek Guide test strip Generic drug: glucose blood USE TO CHECK FASTING BLOOD SUGAR DAILY   accu-chek soft touch lancets Use to check sugars 3x daily  ICD-10 E11.9   Albuterol Sulfate 108 (90 Base) MCG/ACT Aepb Commonly known as: ProAir RespiClick Inhale 2 puffs into the lungs every 4 (four) hours as needed.   atorvastatin 20  MG tablet Commonly known as: LIPITOR Take 1 tablet (20 mg) by mouth daily.   blood glucose meter kit and supplies Dispense based on patient and insurance preference. Use daily to check fasting blood glucose  (FOR ICD-10 E11.9).   DULoxetine 60 MG capsule Commonly known as: Cymbalta Take 1 capsule (60 mg total) by mouth daily. What changed:  medication strength how much to take Changed by: Howard Pouch, DO   hydrochlorothiazide 25 MG tablet Commonly known as: HYDRODIURIL Take 1 tablet (25 mg total) by mouth daily.   hydrocortisone 25 MG suppository Commonly known as: ANUSOL-HC Place 1 suppository (25 mg total) rectally every 12 (twelve) hours.   lisinopril 10 MG tablet Commonly known as: ZESTRIL Take 1 tablet (10 mg total) by mouth daily.   loratadine 10 MG tablet Commonly known as: CLARITIN Take 10 mg by mouth daily.   meloxicam 15 MG tablet Commonly known as: MOBIC Take 1 tablet (15 mg total) by mouth daily.   metFORMIN 1000 MG tablet Commonly known as: GLUCOPHAGE Take 1 tablet (1,000 mg total) by mouth 2 (two) times daily with a meal.   pregabalin 150 MG capsule Commonly known as: LYRICA 1 cap PO in the morning for 7 days, then increase to BID        All past medical history, surgical history, allergies, family history, immunizations andmedications were updated in the EMR today and reviewed under the history and medication portions of their EMR.     ROS: Negative, with the exception of above mentioned in HPI   Objective:  BP 138/84   Pulse 92   Temp 98.2 F (36.8 C) (Oral)   Ht '6\' 3"'  (1.905 m)   Wt (!) 384 lb (174.2 kg)   SpO2 96%   BMI 48.00 kg/m  Body mass index is 48 kg/m. Gen: Afebrile. No acute distress.  HENT: AT. [place].  Eyes:Pupils Equal Round Reactive to light,  Extraocular movements intact,  Conjunctiva without redness, discharge or icterus. Neuro:  Alert. Oriented x3  Psych: Normal affect, dress and demeanor. Normal speech. Normal thought content and judgment.   No results found. No results found. No results found for this or any previous visit (from the past 24 hour(s)).   Assessment/Plan: Ayrton Mcvay is a 45 y.o. male present for OV for  Lumbar radiculopathy/back pain/obesity -Condition is greatly improved on Cymbalta and Lyrica increase of 150 mg twice daily. - Xray completed 11/2018.Mild multilevel degenerative disc disease with small anterior osteophytes. No fracture or traumatic malalignment. -Accommodation form for employer completed today. -Trial of Elavil; was not helpful.  -DCd gabapentin 07/2020 -continue Lyrica taper up to 150 mg BID- feeling great -continue  Mobic 15 mg daily with food.  Patient is allergic to aspirin but reports he has no reaction when using ibuprofen or naproxen. Completed PT   Anxiety with depression -Had been doing well, but patient endorses increased anxiety and feels he could use a higher dose of the Cymbalta. -Increase Cymbalta to 60 mg daily -Continue Lyrica 150 mg twice daily-back pain   Patient has follow-up scheduled in August for his chronic medical conditions.    Reviewed expectations re: course of current medical issues. Discussed self-management of symptoms. Outlined signs and symptoms indicating need for more acute intervention. Patient verbalized understanding and all questions were answered. Patient received an After-Visit Summary.    No orders of the defined types were placed in this encounter.  Meds ordered this encounter  Medications   DULoxetine (CYMBALTA) 60 MG capsule  Sig: Take 1 capsule (60 mg total) by mouth daily.    Dispense:  90 capsule    Refill:  1    DC prior dose- change in therapy.    Referral Orders  No referral(s) requested today     Note is dictated  utilizing voice recognition software. Although note has been proof read prior to signing, occasional typographical errors still can be missed. If any questions arise, please do not hesitate to call for verification.   electronically signed by:  Howard Pouch, DO  [place]

## 2020-11-22 NOTE — Patient Instructions (Signed)
Have a great summer.   I have increased your Cymbalta to 60 mg.

## 2020-12-06 ENCOUNTER — Other Ambulatory Visit: Payer: Self-pay

## 2020-12-06 ENCOUNTER — Ambulatory Visit (AMBULATORY_SURGERY_CENTER): Payer: BC Managed Care – PPO | Admitting: Gastroenterology

## 2020-12-06 ENCOUNTER — Encounter: Payer: BC Managed Care – PPO | Admitting: Gastroenterology

## 2020-12-06 ENCOUNTER — Encounter: Payer: Self-pay | Admitting: Gastroenterology

## 2020-12-06 ENCOUNTER — Other Ambulatory Visit: Payer: Self-pay | Admitting: Gastroenterology

## 2020-12-06 VITALS — BP 115/72 | HR 66 | Temp 97.7°F | Resp 14 | Ht 75.0 in | Wt 388.0 lb

## 2020-12-06 DIAGNOSIS — K635 Polyp of colon: Secondary | ICD-10-CM

## 2020-12-06 DIAGNOSIS — K5289 Other specified noninfective gastroenteritis and colitis: Secondary | ICD-10-CM

## 2020-12-06 DIAGNOSIS — D127 Benign neoplasm of rectosigmoid junction: Secondary | ICD-10-CM

## 2020-12-06 DIAGNOSIS — K641 Second degree hemorrhoids: Secondary | ICD-10-CM

## 2020-12-06 DIAGNOSIS — K625 Hemorrhage of anus and rectum: Secondary | ICD-10-CM | POA: Diagnosis not present

## 2020-12-06 DIAGNOSIS — K529 Noninfective gastroenteritis and colitis, unspecified: Secondary | ICD-10-CM | POA: Diagnosis not present

## 2020-12-06 MED ORDER — SODIUM CHLORIDE 0.9 % IV SOLN: 500.0000 mL | Freq: Once | INTRAVENOUS | Status: DC

## 2020-12-06 NOTE — Patient Instructions (Addendum)
Handouts provided on polyps, hemorrhoids and hemorrhoid banding.   Recommend a High-fiber diet (see handout). Use FiberCon 1-2 tablets by mouth daily.   Use Preparation H Cream: Apply externally as necessary daily. This is an over-the-counter medication.   YOU HAD AN ENDOSCOPIC PROCEDURE TODAY AT [facility]:   Refer to the procedure report that was given to you for any specific questions about what was found during the examination.  If the procedure report does not answer your questions, please call your gastroenterologist to clarify.  If you requested that your care partner not be given the details of your procedure findings, then the procedure report has been included in a sealed envelope for you to review at your convenience later.  YOU SHOULD EXPECT: Some feelings of bloating in the abdomen. Passage of more gas than usual.  Walking can help get rid of the air that was put into your GI tract during the procedure and reduce the bloating. If you had a lower endoscopy (such as a colonoscopy or flexible sigmoidoscopy) you may notice spotting of blood in your stool or on the toilet paper. If you underwent a bowel prep for your procedure, you may not have a normal bowel movement for a few days.  Please Note:  You might notice some irritation and congestion in your nose or some drainage.  This is from the oxygen used during your procedure.  There is no need for concern and it should clear up in a day or so.  SYMPTOMS TO REPORT IMMEDIATELY:  Following lower endoscopy (colonoscopy or flexible sigmoidoscopy):  Excessive amounts of blood in the stool  Significant tenderness or worsening of abdominal pains  Swelling of the abdomen that is new, acute  Fever of 100F or higher  For urgent or emergent issues, a gastroenterologist can be reached at any hour by calling [phone]. Do not use MyChart messaging for urgent concerns.    DIET:  We do recommend a small meal at first, but  then you may proceed to your regular diet.  Drink plenty of fluids but you should avoid alcoholic beverages for 24 hours.  ACTIVITY:  You should plan to take it easy for the rest of today and you should NOT DRIVE or use heavy machinery until tomorrow (because of the sedation medicines used during the test).    FOLLOW UP: Our staff will call the number listed on your records 48-72 hours following your procedure to check on you and address any questions or concerns that you may have regarding the information given to you following your procedure. If we do not reach you, we will leave a message.  We will attempt to reach you two times.  During this call, we will ask if you have developed any symptoms of COVID 19. If you develop any symptoms (ie: fever, flu-like symptoms, shortness of breath, cough etc.) before then, please call [phone].  If you test positive for Covid 19 in the 2 weeks post procedure, please call and report this information to [place].    If any biopsies were taken you will be contacted by phone or by letter within the next 1-3 weeks.  Please call us at [phone] if you have not heard about the biopsies in 3 weeks.    SIGNATURES/CONFIDENTIALITY: You and/or your care partner have signed paperwork which will be entered into your electronic medical record.  These signatures attest to the fact that that the information above on your After Visit Summary has  been reviewed and is understood.  Full responsibility of the confidentiality of this discharge information lies with you and/or your care-partner.

## 2020-12-06 NOTE — Progress Notes (Signed)
Christopher Ingram

## 2020-12-06 NOTE — Progress Notes (Signed)
A/ox3, pleased with MAC, report to RN 

## 2020-12-06 NOTE — Op Note (Signed)
[facility] Patient Name: Christopher Ingram Procedure Date: 12/06/2020 1:10 PM MRN: [id number] Endoscopist: Justice Britain , MD Age: 45 Referring MD:  Date of Birth: Feb 23, 1976 Gender: Male Account #: [id number] Procedure:                Colonoscopy Indications:              Screening for colorectal malignant neoplasm,                            Incidental - Rectal bleeding, Incidental -                            Suspected hemorrhoids Medicines:                Monitored Anesthesia Care Procedure:                Pre-Anesthesia Assessment:                           - Prior to the procedure, a History and Physical                            was performed, and patient medications and                            allergies were reviewed. The patient's tolerance of                            previous anesthesia was also reviewed. The risks                            and benefits of the procedure and the sedation                            options and risks were discussed with the patient.                            All questions were answered, and informed consent                            was obtained. Prior Anticoagulants: The patient has                            taken no previous anticoagulant or antiplatelet                            agents except for NSAID medication. ASA Grade                            Assessment: III - A patient with severe systemic                            disease. After reviewing the risks and benefits,  the patient was deemed in satisfactory condition to                            undergo the procedure.                           After obtaining informed consent, the colonoscope                            was passed under direct vision. Throughout the                            procedure, the patient's blood pressure, pulse, and                            oxygen saturations were monitored continuously. The                             CF [id number] #[id number] was introduced through the anus                            and advanced to the 6 cm into the ileum. The                            colonoscopy was performed without difficulty. The                            patient tolerated the procedure. The quality of the                            bowel preparation was adequate. The terminal ileum,                            ileocecal valve, appendiceal orifice, and rectum                            were photographed. Scope In: 1:31:49 PM Scope Out: 1:50:37 PM Scope Withdrawal Time: 0 hours 14 minutes 24 seconds  Total Procedure Duration: 0 hours 18 minutes 48 seconds  Findings:                 The digital rectal exam findings include                            hemorrhoids. Pertinent negatives include no                            palpable rectal lesions.                           The terminal ileum appeared normal. Biopsies were                            taken with a cold forceps for histology to rule out  chronic ileitis.                           Localized moderate inflammation characterized by                            congestion (edema), erosions, erythema, friability,                            granularity and shallow ulcerations was found in                            the proximal ascending colon, in the cecum and at                            the ileocecal valve. Biopsies were taken with a                            cold forceps for histology and placed into right                            colon jar to rule out chronic colitis.                           Normal mucosa was found in the transverse colon, at                            the hepatic flexure and in the distal ascending                            colon - biopsies also obtained from here and placed                            in right-colon jar to rule out chronic colitis.                           Normal mucosa was found in the  rectum, in the                            recto-sigmoid colon, in the sigmoid colon, in the                            descending colon and at the splenic flexure.                            Biopsies were taken with a cold forceps for                            histology and placed in left colon jar to rule out                            chronic colitis.  Many sessile polyps were found in the recto-sigmoid                            colon and sigmoid colon. The polyps were 1 to 5 mm                            in size. Typical appearance of hyperplastic polyps.                            Five of these polyps were removed/sampled with a                            cold snare. Resection and retrieval were complete.                           Non-bleeding non-thrombosed internal hemorrhoids                            were found during retroflexion, during perianal                            exam and during digital exam. The hemorrhoids were                            Grade II (internal hemorrhoids that prolapse but                            reduce spontaneously). Complications:            No immediate complications. Estimated Blood Loss:     Estimated blood loss was minimal. Impression:               - Hemorrhoids found on digital rectal exam.                           - The examined portion of the ileum was normal.                           - Localized moderate inflammation was found in the                            proximal ascending colon, in the cecum and at the                            ileocecal valve secondary to colitis. Biopsied.                           - Normal mucosa in the transverse colon, at the                            hepatic flexure and in the distal ascending colon                            otherwise.                           -  Normal mucosa in the rectum, in the recto-sigmoid                            colon, in the sigmoid colon, in the  descending                            colon and at the splenic flexure. Biopsied.                           - Many 1 to 5 mm polyps at the recto-sigmoid colon                            and in the sigmoid colon (likely hyperplastic),                            five of these were removed/sampled with a cold                            snare. Resected and retrieved.                           - Non-bleeding non-thrombosed internal hemorrhoids. Recommendation:           - The patient will be observed post-procedure,                            until all discharge criteria are met.                           - Discharge patient to home.                           - Patient has a contact number available for                            emergencies. The signs and symptoms of potential                            delayed complications were discussed with the                            patient. Return to normal activities tomorrow.                            Written discharge instructions were provided to the                            patient.                           - High fiber diet.                           - Use FiberCon 1-2 tablets PO daily.                           -  Preparation H cream: Apply externally as                            necessary daily.                           - Anusol suppositories can be considered in future.                           - If conservative measures fail to help stop                            frequent rectal bleeding, then would consider role                            of hemorrhoidal banding.                           - Based on pathology will determine potential role                            of holding NSAIDs/decreasing NSAIDs and/or                            treatments.                           - Suspect polyps are hyperplastic. Unless any of                            the polyps return adenomatous, then repeat                            colonoscopy for  colon cancer screening would                            normally be recommended at a 10-year followup. If                            any polyps return as adenomatous, then will need                            repeat Flexible Sigmoidoscopy within next 6-12                            months for polyp removal.                           - May want to discuss earlier repeat to ensure                            healing of the inflammation/ulceration noted in                            proximal colon, again based on final pathology.                           -  The findings and recommendations were discussed                            with the patient.                           - The findings and recommendations were discussed                            with the patient's family. Justice Britain, MD 12/06/2020 2:01:52 PM

## 2020-12-08 ENCOUNTER — Telehealth: Payer: Self-pay

## 2020-12-08 NOTE — Telephone Encounter (Signed)
Second attempt follow up call to pt, lm on vm. 

## 2020-12-08 NOTE — Telephone Encounter (Signed)
Patient returning missed call. Patient states he is feeling fine so far/no concerns..  Thank you

## 2020-12-08 NOTE — Telephone Encounter (Signed)
First attempt follow up call to pt, no answer. 

## 2020-12-13 ENCOUNTER — Encounter: Payer: Self-pay | Admitting: Gastroenterology

## 2021-01-05 ENCOUNTER — Other Ambulatory Visit: Payer: Self-pay

## 2021-01-11 ENCOUNTER — Encounter: Payer: BC Managed Care – PPO | Admitting: Family Medicine

## 2021-02-19 ENCOUNTER — Other Ambulatory Visit: Payer: Self-pay

## 2021-02-19 MED ORDER — MELOXICAM 15 MG PO TABS: 15.0000 mg | ORAL_TABLET | Freq: Every day | ORAL | 0 refills | Status: DC

## 2021-02-21 ENCOUNTER — Encounter: Payer: Self-pay | Admitting: Family Medicine

## 2021-02-21 ENCOUNTER — Ambulatory Visit (INDEPENDENT_AMBULATORY_CARE_PROVIDER_SITE_OTHER): Payer: BC Managed Care – PPO | Admitting: Family Medicine

## 2021-02-21 ENCOUNTER — Other Ambulatory Visit: Payer: Self-pay

## 2021-02-21 VITALS — BP 116/81 | HR 79 | Temp 98.0°F | Ht 73.5 in | Wt 387.0 lb

## 2021-02-21 DIAGNOSIS — E785 Hyperlipidemia, unspecified: Secondary | ICD-10-CM | POA: Diagnosis not present

## 2021-02-21 DIAGNOSIS — I10 Essential (primary) hypertension: Secondary | ICD-10-CM

## 2021-02-21 DIAGNOSIS — M5416 Radiculopathy, lumbar region: Secondary | ICD-10-CM | POA: Diagnosis not present

## 2021-02-21 DIAGNOSIS — Z0000 Encounter for general adult medical examination without abnormal findings: Secondary | ICD-10-CM

## 2021-02-21 DIAGNOSIS — G4733 Obstructive sleep apnea (adult) (pediatric): Secondary | ICD-10-CM | POA: Diagnosis not present

## 2021-02-21 DIAGNOSIS — Z125 Encounter for screening for malignant neoplasm of prostate: Secondary | ICD-10-CM

## 2021-02-21 DIAGNOSIS — E1169 Type 2 diabetes mellitus with other specified complication: Secondary | ICD-10-CM | POA: Diagnosis not present

## 2021-02-21 DIAGNOSIS — Z1211 Encounter for screening for malignant neoplasm of colon: Secondary | ICD-10-CM | POA: Diagnosis not present

## 2021-02-21 DIAGNOSIS — F418 Other specified anxiety disorders: Secondary | ICD-10-CM

## 2021-02-21 DIAGNOSIS — Z23 Encounter for immunization: Secondary | ICD-10-CM

## 2021-02-21 DIAGNOSIS — Z9989 Dependence on other enabling machines and devices: Secondary | ICD-10-CM

## 2021-02-21 LAB — PSA: PSA: 0.46 ng/mL (ref 0.10–4.00)

## 2021-02-21 LAB — HEMOGLOBIN A1C: Hgb A1c MFr Bld: 7.9 % — ABNORMAL HIGH (ref 4.6–6.5)

## 2021-02-21 MED ORDER — ATORVASTATIN CALCIUM 20 MG PO TABS: ORAL_TABLET | ORAL | 3 refills | Status: DC

## 2021-02-21 MED ORDER — DULOXETINE HCL 60 MG PO CPEP: 60.0000 mg | ORAL_CAPSULE | Freq: Every day | ORAL | 1 refills | Status: DC

## 2021-02-21 MED ORDER — HYDROCHLOROTHIAZIDE 25 MG PO TABS: 25.0000 mg | ORAL_TABLET | Freq: Every day | ORAL | 1 refills | Status: DC

## 2021-02-21 MED ORDER — PREGABALIN 150 MG PO CAPS: ORAL_CAPSULE | ORAL | 1 refills | Status: DC

## 2021-02-21 MED ORDER — MELOXICAM 15 MG PO TABS: 15.0000 mg | ORAL_TABLET | Freq: Every day | ORAL | 1 refills | Status: DC

## 2021-02-21 MED ORDER — METFORMIN HCL 1000 MG PO TABS: 1000.0000 mg | ORAL_TABLET | Freq: Two times a day (BID) | ORAL | 1 refills | Status: DC

## 2021-02-21 MED ORDER — LISINOPRIL 10 MG PO TABS: 10.0000 mg | ORAL_TABLET | Freq: Every day | ORAL | 1 refills | Status: DC

## 2021-02-21 NOTE — Patient Instructions (Signed)
Great to see you today.  I have refilled the medication(s) we provide.   If labs were collected, we will inform you of lab results once received either by echart message or telephone call.   - echart message- for normal results that have been seen by the patient already.   - telephone call: abnormal results or if patient has not viewed results in their echart. Health Maintenance, Male Adopting a healthy lifestyle and getting preventive care are important in promoting health and wellness. Ask your health care provider about: The right schedule for you to have regular tests and exams. Things you can do on your own to prevent diseases and keep yourself healthy. What should I know about diet, weight, and exercise? Eat a healthy diet  Eat a diet that includes plenty of vegetables, fruits, low-fat dairy products, and lean protein. Do not eat a lot of foods that are high in solid fats, added sugars, or sodium. Maintain a healthy weight Body mass index (BMI) is a measurement that can be used to identify possible weight problems. It estimates body fat based on height and weight. Your health care provider can help determine your BMI and help you achieve or maintain a healthy weight. Get regular exercise Get regular exercise. This is one of the most important things you can do for your health. Most adults should: Exercise for at least 150 minutes each week. The exercise should increase your heart rate and make you sweat (moderate-intensity exercise). Do strengthening exercises at least twice a week. This is in addition to the moderate-intensity exercise. Spend less time sitting. Even light physical activity can be beneficial. Watch cholesterol and blood lipids Have your blood tested for lipids and cholesterol at 47 years of age, then have this test every 5 years. You may need to have your cholesterol levels checked more often if: Your lipid or cholesterol levels are high. You are older than 47 years  of age. You are at high risk for heart disease. What should I know about cancer screening? Many types of cancers can be detected early and may often be prevented. Depending on your health history and family history, you may need to have cancer screening at various ages. This may include screening for: Colorectal cancer. Prostate cancer. Skin cancer. Lung cancer. What should I know about heart disease, diabetes, and high blood pressure? Blood pressure and heart disease High blood pressure causes heart disease and increases the risk of stroke. This is more likely to develop in people who have high blood pressure readings, are of African descent, or are overweight. Talk with your health care provider about your target blood pressure readings. Have your blood pressure checked: Every 3-5 years if you are 71-24 years of age. Every year if you are 28 years old or older. If you are between the ages of 43 and 81 and are a current or former smoker, ask your health care provider if you should have a one-time screening for abdominal aortic aneurysm (AAA). Diabetes Have regular diabetes screenings. This checks your fasting blood sugar level. Have the screening done: Once every three years after age 7 if you are at a normal weight and have a low risk for diabetes. More often and at a younger age if you are overweight or have a high risk for diabetes. What should I know about preventing infection? Hepatitis B If you have a higher risk for hepatitis B, you should be screened for this virus. Talk with your health care provider  to find out if you are at risk for hepatitis B infection. Hepatitis C Blood testing is recommended for: Everyone born from [birth year] through 1967. Anyone with known risk factors for hepatitis C. Sexually transmitted infections (STIs) You should be screened each year for STIs, including gonorrhea and chlamydia, if: You are sexually active and are younger than 47 years of age. You are  older than 47 years of age and your health care provider tells you that you are at risk for this type of infection. Your sexual activity has changed since you were last screened, and you are at increased risk for chlamydia or gonorrhea. Ask your health care provider if you are at risk. Ask your health care provider about whether you are at high risk for HIV. Your health care provider may recommend a prescription medicine to help prevent HIV infection. If you choose to take medicine to prevent HIV, you should first get tested for HIV. You should then be tested every 3 months for as long as you are taking the medicine. Follow these instructions at home: Lifestyle Do not use any products that contain nicotine or tobacco, such as cigarettes, e-cigarettes, and chewing tobacco. If you need help quitting, ask your health care provider. Do not use street drugs. Do not share needles. Ask your health care provider for help if you need support or information about quitting drugs. Alcohol use Do not drink alcohol if your health care provider tells you not to drink. If you drink alcohol: Limit how much you have to 0-2 drinks a day. Be aware of how much alcohol is in your drink. In the [place] one drink equals one 12 oz bottle of beer (355 mL), one 5 oz glass of wine (148 mL), or one 1 oz glass of hard liquor (44 mL). General instructions Schedule regular health, dental, and eye exams. Stay current with your vaccines. Tell your health care provider if: You often feel depressed. You have ever been abused or do not feel safe at home. Summary Adopting a healthy lifestyle and getting preventive care are important in promoting health and wellness. Follow your health care provider's instructions about healthy diet, exercising, and getting tested or screened for diseases. Follow your health care provider's instructions on monitoring your cholesterol and blood pressure. This information is not intended to replace  advice given to you by your health care provider. Make sure you discuss any questions you have with your health care provider. Document Revised: 07/28/2020 Document Reviewed: 05/13/2018 Elsevier Patient Education  2022 [employer].

## 2021-02-21 NOTE — Progress Notes (Signed)
 This visit occurred during the SARS-CoV-2 public health emergency.  Safety protocols were in place, including screening questions prior to the visit, additional usage of staff PPE, and extensive cleaning of exam room while observing appropriate contact time as indicated for disinfecting solutions.    Patient ID: Christopher Ingram, male  DOB: 08/14/1975, 45 y.o.   MRN: [id number] Patient Care Team    Relationship Specialty Notifications Start End  Kuneff, Renee A, DO PCP - General Family Medicine  01/03/16   Sood, Vineet, MD Consulting Physician Pulmonary Disease  10/11/20   [name], Gabriel Jr., MD Consulting Physician Gastroenterology  02/21/21     Chief Complaint  Patient presents with   Annual Exam    Pt is fasting    Subjective:  Christopher Ingram is a 45 y.o. male present for CPE/cmc  All past medical history, surgical history, allergies, family history, immunizations, medications and social history were updated in the electronic medical record today. All recent labs, ED visits and hospitalizations within the last year were reviewed.  Health maintenance:  Colonoscopy: last screen 12/06/2020, recommend follow up 2 yr; Completed by Dr. [name]. Immunizations:  tdap 02/2021-today, influenza 02/2021-today, PNA series 2020, covid x3- counseled booster Infectious disease screening: HIV and  Hep C declined. PSA:  Lab Results  Component Value Date   PSA 0.66 01/19/2016  , pt was counseled on prostate cancer screenings.  Assistive device: none Oxygen use:none Patient has a Dental home. Hospitalizations/ED visits: reviewed  Depression/anxiety/neuropathy-lumbar pain: Patient reports compliance cymbalta 60 mg daily and Lyrica 150 mg twice daily.  He feels this combination is working very well for his depression, anxiety and low back pain/neuropathy.   Tired meds in the past: zoloft. Prozac  Lumbar pain-radiculopathy: Symptoms are well controlled with meloxicam daily and Lyrica 150 mg twice  daily.     Hypertension/morbid obesity/elevated tg : Pt reports compliance with his HCTZ 25 mg and lisinopril 10 mg. He is also eating a low salt diet.Patient denies chest pain, shortness of breath, dizziness or lower extremity edema.  He is tolerating statin.  Diet: low sodium, low carb Exercise: routinely RF: HTN, HLD, DM, obesity, FH HD (father MI 53)  Diabetes:  Diagnosed 10/2018 with an a1c of 9.5.  Pt did well with janumet-- unfortunately it is now too expensive.Patient denies dizziness, hyperglycemic or hypoglycemic events. Patient denies numbness, tingling in the extremities or nonhealing wounds of feet.  Tolerating metformin 1000 mg qd. PNA series: pneumovax completed  Flu shot: completed today (recommneded yearly) Foot exam: Completed today Eye exam: completed 06/2020 Malb: Prescribed ACE  Depression screen PHQ 2/9 02/21/2021 11/22/2020 10/11/2020 07/14/2020 10/11/2019  Decreased Interest 1 1 0 2 0  Down, Depressed, Hopeless 0 2 0 3 0  PHQ - 2 Score 1 3 0 5 0  Altered sleeping 0 1 1 3 3  Tired, decreased energy 1 2 1 2 1  Change in appetite 2 3 2 3 0  Feeling bad or failure about yourself  0 0 0 1 0  Trouble concentrating 0 1 0 0 0  Moving slowly or fidgety/restless 0 0 0 0 0  Suicidal thoughts 0 0 0 0 0  PHQ-9 Score 4 10 4 14 4  Difficult doing work/chores - - - - Very difficult  Some recent data might be hidden   GAD 7 : Generalized Anxiety Score 02/21/2021 11/22/2020 10/11/2020 07/14/2020  Nervous, Anxious, on Edge 1 2 0 2  Control/stop worrying 0 1 0 2  Worry too much -   different things 0 2 0 2  Trouble relaxing 0 2 1 1  Restless 0 1 0 0  Easily annoyed or irritable 1 2 1 3  Afraid - awful might happen 0 0 0 1  Total GAD 7 Score 2 10 2 11  Anxiety Difficulty - - - -         Fall Risk  01/24/2016 01/03/2016  Falls in the past year? No No      Immunization History  Administered Date(s) Administered   Influenza,inj,Quad PF,6+ Mos 05/15/2016, 04/07/2017,  01/26/2019, 02/21/2021   Influenza-Unspecified 03/19/2018, 04/01/2020   PFIZER(Purple Top)SARS-COV-2 Vaccination 08/26/2019, 09/20/2019, 04/25/2020   Pneumococcal Polysaccharide-23 01/26/2019   Tdap 10/31/2011, 02/21/2021     Past Medical History:  Diagnosis Date   Allergy    Anterior femoral cutaneous neuropathy of right lower extremity    Anxiety    Asthma    Chicken pox    Depression, major    GERD (gastroesophageal reflux disease)    Hemorrhoids 11/03/2020   Hypertension    Internal hemorrhoids    rectal bleeding, colonoscopy   Migraine    OSA on CPAP 04/2012   AHI23   RAD (reactive airway disease)    ? 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[facility]   Allergies  Allergen Reactions   Aspirin     hives   Past Surgical History:  Procedure Laterality Date   EYE SURGERY     lazy eye   Family History  Problem Relation Age of Onset   Diabetes Mother    Colon polyps Mother    Diabetes Father    Early death Father        heart attack 53   Breast cancer Sister    Liver cancer Sister    Mental illness Brother    Colon cancer Neg Hx    Esophageal cancer Neg Hx    Inflammatory bowel disease Neg Hx    Pancreatic cancer Neg Hx    Rectal cancer Neg Hx    Stomach cancer Neg Hx    Social History   Social History Narrative   Married to Julitmar ([place]olly). Has one child named Isabella (special needs).    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    Denies tobacco, drugs or etoh.    Drinks caffeine. Takes a daily vitamin.    Wears seatbelt. Smoke detector in the home.    Exercises routinely.    Feels safe in his relationships     Allergies as of 02/21/2021       Reactions   Aspirin    hives        Medication List        Accurate as of February 21, 2021 12:48 PM. If you have any questions, ask your nurse or doctor.          Accu-Chek Guide test strip Generic drug:  glucose blood USE TO CHECK FASTING BLOOD SUGAR DAILY   accu-chek soft touch lancets Use to check sugars 3x daily ICD-10 E11.9   acetaminophen 500 MG tablet Commonly known as: TYLENOL Take 500 mg by mouth every 6 (six) hours as needed.   Albuterol Sulfate 108 (90 Base) MCG/ACT Aepb Commonly known as: ProAir RespiClick Inhale 2 puffs into the lungs every 4 (four) hours as needed.   atorvastatin 20 MG tablet Commonly known as: LIPITOR Take 1 tablet (20   mg) by mouth daily.   blood glucose meter kit and supplies Dispense based on patient and insurance preference. Use daily to check fasting blood glucose  (FOR ICD-10 E11.9).   DULoxetine 60 MG capsule Commonly known as: Cymbalta Take 1 capsule (60 mg total) by mouth daily.   hydrochlorothiazide 25 MG tablet Commonly known as: HYDRODIURIL Take 1 tablet (25 mg total) by mouth daily.   hydrocortisone 25 MG suppository Commonly known as: ANUSOL-HC Place 1 suppository (25 mg total) rectally every 12 (twelve) hours.   lisinopril 10 MG tablet Commonly known as: ZESTRIL Take 1 tablet (10 mg total) by mouth daily.   loratadine 10 MG tablet Commonly known as: CLARITIN Take 10 mg by mouth daily.   meloxicam 15 MG tablet Commonly known as: MOBIC Take 1 tablet (15 mg total) by mouth daily.   metFORMIN 1000 MG tablet Commonly known as: GLUCOPHAGE Take 1 tablet (1,000 mg total) by mouth 2 (two) times daily with a meal.   pregabalin 150 MG capsule Commonly known as: LYRICA 1 cap PO in the morning for 7 days, then increase to BID       All past medical history, surgical history, allergies, family history, immunizations andmedications were updated in the EMR today and reviewed under the history and medication portions of their EMR.     No results found for this or any previous visit (from the past 2160 hour(s)).  DG Lumbar Spine Complete  Result Date: 11/11/2018 CLINICAL DATA:  Pain radiating into bilateral lower extremities. No  older recent trauma. EXAM: LUMBAR SPINE - COMPLETE 4+ VIEW COMPARISON:  None. FINDINGS: Mild multilevel degenerative disc disease with small anterior osteophytes. No fracture or traumatic malalignment. IMPRESSION: Mild degenerative disc disease. Electronically Signed   By: David  Williams III M.D   On: 11/11/2018 17:23     ROS: 14 pt review of systems performed and negative (unless mentioned in an HPI)  Objective: BP 116/81   Pulse 79   Temp 98 F (36.7 C) (Oral)   Ht 6' 1.5" (1.867 m)   Wt (!) 387 lb (175.5 kg)   SpO2 98%   BMI 50.37 kg/m  Gen: Afebrile. No acute distress. Nontoxic in appearance, well-developed, well-nourished, very pleasant, obese male HENT: AT. . Bilateral TM visualized and normal in appearance, normal external auditory canal. MMM, no oral lesions, adequate dentition. Bilateral nares within normal limits. Throat without erythema, ulcerations or exudates.  No cough on exam, no hoarseness on exam. Eyes:Pupils Equal Round Reactive to light, Extraocular movements intact,  Conjunctiva without redness, discharge or icterus. Neck/lymp/endocrine: Supple, no lymphadenopathy, no thyromegaly CV: RRR no murmur, no edema, +2/4 P posterior tibialis pulses.  Chest: CTAB, no wheeze, rhonchi or crackles.  Normal respiratory effort.  Good air movement. Abd: Soft.  Obese. NTND. BS present.  No masses palpated. No hepatosplenomegaly. No rebound tenderness or guarding. Skin: Normal rashes, purpura or petechiae. Warm and well-perfused. Skin intact. Neuro/Msk:  Normal gait. PERLA. EOMi. Alert. Oriented x3.  Cranial nerves II through XII intact. Muscle strength 5/5 upper/lower extremity. DTRs equal bilaterally. Psych: Normal affect, dress and demeanor. Normal speech. Normal thought content and judgment. Diabetic Foot Exam - Simple   Simple Foot Form Diabetic Foot exam was performed with the following findings: Yes 02/21/2021 10:03 AM  Visual Inspection No deformities, no ulcerations, no  other skin breakdown bilaterally: Yes Sensation Testing Intact to touch and monofilament testing bilaterally: Yes Pulse Check Posterior Tibialis and Dorsalis pulse intact bilaterally: Yes Comments       No results found.  Assessment/plan: Bryon Parker is a 45 y.o. male present for CPE/CMC Lumbar radiculopathy/back pain/obesity -Condition is well controlled. -Continue Lyrica 150 mg twice daily. -Continue Cymbalta 60 mg daily. -Continue Mobic 15 mg daily with food. - Xray completed 11/2018.Mild multilevel degenerative disc disease with small anterior osteophytes. No fracture or traumatic malalignment. -Accommodation form for employer completed today. -Trial of Elavil; was not helpful.    Anxiety with depression -Well-controlled. -Continue Cymbalta to 60 mg daily -Continue Lyrica 150 mg twice daily-back pain  Essential hypertension, benign/elevated Tg/morbid obesity -Stable -  goal of 130/80.  - CBC with Differential/Platelet - Comprehensive metabolic panel - Hemoglobin A1c - Lipid panel - TSH -Continue HCTZ 25 mg QD -Continue lisinopril 10 mg qd. - low salt diet.  Type 2 diabetes mellitus with hyperlipidemia (HCC) -Has been stable.  A1c collected today.  Patient will be called with recommendations. - Continue the diet and weight loss -Continue metformin to 1000 mg twice daily. -Continue atorvastatin 20 mg daily - Janumet was too expensive PNA series: pneumovax completed  Flu shot: completed  2021(recommneded yearly) Foot exam: 01/2020 Eye exam: completed 06/2020 Malb: will collect next visit. A1c: 9.5>5.8> 5.8>6.3> 6.7>6.4> collected today F/u 4-6 months pending upon A1c results.   OSA on CPAP Managed by pulmonology.  Compliant with CPAP. Prostate cancer screening - PSA Influenza vaccination administered at current visit Influenza vaccination updated today Need for Tdap vaccination Tdap updated today Routine general medical examination at a health care  facility Colonoscopy: last screen 12/06/2020, recommend follow up 2 yr; Completed by Dr. Rush Landmark. Immunizations:  tdap 02/2021-today, influenza 02/2021-today, PNA series 2020, covid x3- counseled booster Infectious disease screening: HIV and  Hep C declined. PSA collected today Patient was encouraged to exercise greater than 150 minutes a week. Patient was encouraged to choose a diet filled with fresh fruits and vegetables, and lean meats. AVS provided to patient today for education/recommendation on gender specific health and safety maintenance.   Return in about 4 months (around 06/23/2021) for [place] (30 min).  Orders Placed This Encounter  Procedures   Tdap vaccine greater than or equal to 7yo IM   Flu Vaccine QUAD 6+ mos PF IM (Fluarix Quad PF)   CBC with Differential/Platelet   Comprehensive metabolic panel   Hemoglobin A1c   Lipid panel   TSH   PSA    Meds ordered this encounter  Medications   meloxicam (MOBIC) 15 MG tablet    Sig: Take 1 tablet (15 mg total) by mouth daily.    Dispense:  90 tablet    Refill:  1    MUST HAVE OV FOR FURTHER REFILLS   atorvastatin (LIPITOR) 20 MG tablet    Sig: Take 1 tablet (20 mg) by mouth daily.    Dispense:  90 tablet    Refill:  3    Needs f/u appt   DULoxetine (CYMBALTA) 60 MG capsule    Sig: Take 1 capsule (60 mg total) by mouth daily.    Dispense:  90 capsule    Refill:  1   hydrochlorothiazide (HYDRODIURIL) 25 MG tablet    Sig: Take 1 tablet (25 mg total) by mouth daily.    Dispense:  90 tablet    Refill:  1   lisinopril (ZESTRIL) 10 MG tablet    Sig: Take 1 tablet (10 mg total) by mouth daily.    Dispense:  90 tablet    Refill:  1   metFORMIN (GLUCOPHAGE) 1000 MG tablet  Sig: Take 1 tablet (1,000 mg total) by mouth 2 (two) times daily with a meal.    Dispense:  180 tablet    Refill:  1   pregabalin (LYRICA) 150 MG capsule    Sig: 1 cap PO in the morning for 7 days, then increase to BID    Dispense:  180 capsule     Refill:  1   Referral Orders  No referral(s) requested today     Note is dictated utilizing voice recognition software. Although note has been proof read prior to signing, occasional typographical errors still can be missed. If any questions arise, please do not hesitate to call for verification.  Electronically signed by: Howard Pouch, DO [place]

## 2021-02-22 ENCOUNTER — Telehealth: Payer: Self-pay | Admitting: Family Medicine

## 2021-02-22 LAB — COMPREHENSIVE METABOLIC PANEL
ALT: 30 U/L (ref 0–53)
AST: 20 U/L (ref 0–37)
Albumin: 4.4 g/dL (ref 3.5–5.2)
Alkaline Phosphatase: 49 U/L (ref 39–117)
BUN: 20 mg/dL (ref 6–23)
CO2: 25 mEq/L (ref 19–32)
Calcium: 9.9 mg/dL (ref 8.4–10.5)
Chloride: 102 mEq/L (ref 96–112)
Creatinine, Ser: 0.96 mg/dL (ref 0.40–1.50)
GFR: 94.35 mL/min (ref >60.00)
Glucose, Bld: 103 mg/dL — ABNORMAL HIGH (ref 70–99)
Potassium: 4.2 mEq/L (ref 3.5–5.1)
Sodium: 140 mEq/L (ref 135–145)
Total Bilirubin: 0.4 mg/dL (ref 0.2–1.2)
Total Protein: 7.4 g/dL (ref 6.0–8.3)

## 2021-02-22 LAB — CBC WITH DIFFERENTIAL/PLATELET
Basophils Absolute: 0.1 10*3/uL (ref 0.0–0.1)
Basophils Relative: 0.8 % (ref 0.0–3.0)
Eosinophils Absolute: 0.3 10*3/uL (ref 0.0–0.7)
Eosinophils Relative: 4 % (ref 0.0–5.0)
HCT: 41.6 % (ref 39.0–52.0)
Hemoglobin: 13.7 g/dL (ref 13.0–17.0)
Lymphocytes Relative: 21.7 % (ref 12.0–46.0)
Lymphs Abs: 1.8 10*3/uL (ref 0.7–4.0)
MCHC: 32.9 g/dL (ref 30.0–36.0)
MCV: 86 fl (ref 78.0–100.0)
Monocytes Absolute: 0.6 10*3/uL (ref 0.1–1.0)
Monocytes Relative: 6.8 % (ref 3.0–12.0)
Neutro Abs: 5.7 10*3/uL (ref 1.4–7.7)
Neutrophils Relative %: 66.7 % (ref 43.0–77.0)
Platelets: 286 10*3/uL (ref 150.0–400.0)
RBC: 4.83 Mil/uL (ref 4.22–5.81)
RDW: 15.3 % (ref 11.5–15.5)
WBC: 8.5 10*3/uL (ref 4.0–10.5)

## 2021-02-22 LAB — LIPID PANEL
Cholesterol: 147 mg/dL (ref 0–200)
HDL: 55.8 mg/dL (ref >39.00)
LDL Cholesterol: 60 mg/dL (ref 0–99)
NonHDL: 90.88
Total CHOL/HDL Ratio: 3
Triglycerides: 152 mg/dL — ABNORMAL HIGH (ref 0.0–149.0)
VLDL: 30.4 mg/dL (ref 0.0–40.0)

## 2021-02-22 LAB — TSH: TSH: 1.03 u[IU]/mL (ref 0.35–5.50)

## 2021-02-22 MED ORDER — RYBELSUS 3 MG PO TABS: 1.0000 | ORAL_TABLET | Freq: Every day | ORAL | 0 refills | Status: DC

## 2021-02-22 MED ORDER — RYBELSUS 7 MG PO TABS: 7.0000 mg | ORAL_TABLET | Freq: Every day | ORAL | 1 refills | Status: DC

## 2021-02-22 NOTE — Telephone Encounter (Signed)
Spoke with pt regarding labs and instructions.   

## 2021-02-22 NOTE — Telephone Encounter (Signed)
Please call patient Liver, kidney and thyroid function are normal Blood cell counts and electrolytes are normal PSA/prostate cancer screening normal Cholesterol panel looks excellent and is at goal.  Continue statin. Diabetes /A1c increased from 6.4 prior, now 7.9.  We need to continue the metformin and add a another agent for better coverage. I have called in a medication called Rybelsus.  This is a diabetic medication/pill that can also help with some mild weight loss.  It is similar to the Ozempic medication which is a once a week shot. Start Rybelsus 3 mg daily for 4 weeks.  Then a second prescription is called in for Rybelsus 7 mg daily.  If medication is too expensive, will likely need a prior authorization, then we will pursue other options.  However if we can get the Rybelsus approved, this would be a great medicine for him.      Staff: FYI-if prior authorization is needed patient is on highest tolerated dose of metformin already and sulfa products cause nausea.

## 2021-06-05 ENCOUNTER — Other Ambulatory Visit: Payer: Self-pay | Admitting: Family Medicine

## 2021-06-21 ENCOUNTER — Telehealth: Payer: Self-pay

## 2021-06-21 NOTE — Telephone Encounter (Signed)
PA sent via covermymed on  06/21/21   Key: [id number]   Medication: Pregabalin 150MG  capsules  Dx: M54.16(lumbar radicular pain)   Per Dr. Raoul Pitch pt has tried and failed trial elavil(not helpful   Waiting for response.

## 2021-06-25 ENCOUNTER — Telehealth: Payer: Self-pay

## 2021-06-25 DIAGNOSIS — E1149 Type 2 diabetes mellitus with other diabetic neurological complication: Secondary | ICD-10-CM

## 2021-06-25 NOTE — Telephone Encounter (Signed)
Pt is wanting to know what can he do while await medication approval.

## 2021-06-25 NOTE — Telephone Encounter (Signed)
Pt is saying  insurance  Nurse, mental health) is needing a completed criteria form filled out by his provider in regards to the medication Pregabalin.--KR  Pt confused on what to do. Pt cell: [phone]

## 2021-06-25 NOTE — Telephone Encounter (Signed)
Patient calling in regards to prior auth for medication Pregabalin.  Looks like PA is still pending from 1/19.  Patient stated he will call his insurance company.  No follow up needed at this time.

## 2021-06-26 ENCOUNTER — Ambulatory Visit: Payer: BC Managed Care – PPO | Admitting: Family Medicine

## 2021-06-26 NOTE — Telephone Encounter (Signed)
Please move on this asap. This is a chronic medication for him and should not be any issue. This is also a med that should not be stopped abruptly, so he needs it to go through asap.

## 2021-06-26 NOTE — Telephone Encounter (Signed)
PA still pending response by insurance.

## 2021-06-27 NOTE — Telephone Encounter (Signed)
PA resumbitted

## 2021-06-28 NOTE — Telephone Encounter (Signed)
PA denied.

## 2021-06-28 NOTE — Telephone Encounter (Signed)
What  is the reasoning behind denial. This has been a chronic medication for him.  In order to  prescribe or give alternatives- we need to more.

## 2021-06-28 NOTE — Telephone Encounter (Signed)
Why your request was denied: You do not meet the requirements of your plan. Your plan covers this drug when you have one of these conditions: - Fibromyalgia - Neuropathic pain associated with spinal cord injury - Partial-onset seizures (i.e., focal-onset seizures) and will be taking the requested drug with another seizure drug - Postherpetic neuralgia - Neuropathic pain associated with diabetic peripheral neuropathy - Cancer-related neuropathic pain - Cancer treatment-related neuropathic pain Your request has been denied based on the information we have

## 2021-07-02 ENCOUNTER — Ambulatory Visit
Admission: EM | Admit: 2021-07-02 | Discharge: 2021-07-02 | Disposition: A | Discharge status: Home / Self Care | Payer: BC Managed Care – PPO | Attending: Family Medicine | Admitting: Family Medicine

## 2021-07-02 ENCOUNTER — Other Ambulatory Visit: Payer: Self-pay

## 2021-07-02 DIAGNOSIS — J069 Acute upper respiratory infection, unspecified: Secondary | ICD-10-CM

## 2021-07-02 NOTE — ED Provider Notes (Signed)
[facility]    CSN: [id number] Arrival date & time: 07/02/21  7591      History   Chief Complaint Chief Complaint  Patient presents with   Nasal Congestion    HPI Christopher Ingram is a 46 y.o. male.   HPI Here for a h/o cough, congestion, aches, and postnasal dc in his throat. No fever noted so far. He has felt a little short of breath.  H/o asthma when young, no trouble for yrs. Does have a h/o DM and htn. Renal function normal in September    Past Medical History:  Diagnosis Date   Allergy    Anterior femoral cutaneous neuropathy of right lower extremity    Anxiety    Asthma    Chicken pox    Depression, major    GERD (gastroesophageal reflux disease)    Hemorrhoids 11/03/2020   Hypertension    Internal hemorrhoids    rectal bleeding, colonoscopy   Migraine    OSA on CPAP 04/2012   AHI23   RAD (reactive airway disease)    ? 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 Authorizing Provider  ACCU-CHEK GUIDE test strip USE TO CHECK FASTING BLOOD SUGAR DAILY 10/22/18   [provider]  acetaminophen (TYLENOL) 500 MG tablet Take 500 mg by mouth every 6 (six) hours as needed.    [provider]  Albuterol Sulfate (PROAIR RESPICLICK) 638 (90 Base) MCG/ACT AEPB Inhale 2 puffs into the lungs every 4 (four) hours as needed. 08/14/18   McGowen, Adrian Blackwater, MD   atorvastatin (LIPITOR) 20 MG tablet Take 1 tablet (20 mg) by mouth daily. 02/21/21   Kuneff, Renee A, DO  blood glucose meter kit and supplies Dispense based on patient and insurance preference. Use daily to check fasting blood glucose  (FOR ICD-10 E11.9). 10/22/18   Kuneff, Renee A, DO  DULoxetine (CYMBALTA) 60 MG capsule Take 1 capsule (60 mg total) by mouth daily. 02/21/21   Kuneff, Renee A, DO  hydrochlorothiazide (HYDRODIURIL) 25 MG tablet Take 1 tablet (25 mg total) by mouth daily. 02/21/21   Kuneff, Renee A, DO  hydrocortisone (ANUSOL-HC) 25 MG suppository Place 1 suppository (25 mg total) rectally every 12 (twelve) hours. 10/31/20   [name], [name], MD  Lancets (ACCU-CHEK SOFT [facility]) lancets Use to check sugars 3x daily ICD-10 E11.9 12/02/18   Kuneff, Renee A, DO  lisinopril (ZESTRIL) 10 MG tablet Take 1 tablet (10 mg total) by mouth daily. 02/21/21   Kuneff, Renee A, DO  loratadine (CLARITIN) 10 MG tablet Take 10 mg by mouth daily.    [provider]  meloxicam (MOBIC) 15 MG tablet Take 1 tablet (15 mg total) by mouth daily. 02/21/21   Kuneff, Renee A, DO  metFORMIN (GLUCOPHAGE) 1000 MG tablet Take 1 tablet (1,000  mg total) by mouth 2 (two) times daily with a meal. 02/21/21   Kuneff, Renee A, DO  pregabalin (LYRICA) 150 MG capsule 1 cap PO in the morning for 7 days, then increase to BID 02/21/21   Kuneff, Renee A, DO  Semaglutide (RYBELSUS) 3 MG TABS Take 1 tablet by mouth daily. 02/22/21   Kuneff, Renee A, DO  Semaglutide (RYBELSUS) 7 MG TABS Take 7 mg by mouth daily. 02/22/21   [name], DO    Family History Family History  Problem Relation Age of Onset   Diabetes Mother    Colon polyps Mother    Diabetes Father    Early death Father        heart attack 84   Breast cancer Sister    Liver cancer Sister    Mental illness Brother    Colon cancer Neg Hx    Esophageal cancer Neg Hx    Inflammatory bowel disease Neg Hx    Pancreatic cancer Neg Hx    Rectal cancer Neg  Hx    Stomach cancer Neg Hx     Social History Social History   Tobacco Use   Smoking status: Never   Smokeless tobacco: Never  Vaping Use   Vaping Use: Never used  Substance Use Topics   Alcohol use: No   Drug use: No     Allergies   Aspirin   Review of Systems Review of Systems   Physical Exam Triage Vital Signs ED Triage Vitals  Enc Vitals Group     BP 07/02/21 0925 (!) 138/98     Pulse Rate 07/02/21 0925 93     Resp 07/02/21 0925 20     Temp 07/02/21 0925 98.5 F (36.9 C)     Temp Source 07/02/21 0925 Oral     SpO2 07/02/21 0925 96 %     Weight --      Height --      Head Circumference --      Peak Flow --      Pain Score 07/02/21 0924 0     Pain Loc --      Pain Edu? --      Excl. in [place]? --    No data found.  Updated Vital Signs BP (!) 138/98    Pulse 93    Temp 98.5 F (36.9 C) (Oral)    Resp 20    SpO2 96%   Visual Acuity Right Eye Distance:   Left Eye Distance:   Bilateral Distance:    Right Eye Near:   Left Eye Near:    Bilateral Near:     Physical Exam Vitals reviewed.  Constitutional:      General: He is not in acute distress.    Appearance: He is not toxic-appearing.  HENT:     Right Ear: Tympanic membrane and ear canal normal.     Left Ear: Tympanic membrane and ear canal normal.     Nose: Nose normal.     Mouth/Throat:     Mouth: Mucous membranes are moist.     Pharynx: Posterior oropharyngeal erythema (mild posterior erythema) present. No oropharyngeal exudate.  Eyes:     Extraocular Movements: Extraocular movements intact.     Conjunctiva/sclera: Conjunctivae normal.     Pupils: Pupils are equal, round, and reactive to light.  Cardiovascular:     Rate and Rhythm: Normal rate and regular rhythm.     Heart sounds: No murmur heard. Pulmonary:  Effort: Pulmonary effort is normal.     Breath sounds: Normal breath sounds.  Musculoskeletal:     Cervical back: Neck supple.  Lymphadenopathy:     Cervical: No cervical  adenopathy.  Skin:    Capillary Refill: Capillary refill takes less than 2 seconds.     Coloration: Skin is not jaundiced or pale.  Neurological:     General: No focal deficit present.     Mental Status: He is alert and oriented to person, place, and time.  Psychiatric:        Behavior: Behavior normal.     UC Treatments / Results  Labs (all labs ordered are listed, but only abnormal results are displayed) Labs Reviewed  COVID-19, FLU A+B NAA    EKG   Radiology No results found.  Procedures Procedures (including critical care time)  Medications Ordered in UC Medications - No data to display  Initial Impression / Assessment and Plan / UC Course  I have reviewed the triage vital signs and the nursing notes.  Pertinent labs & imaging results that were available during my care of the patient were reviewed by me and considered in my medical decision making (see chart for details).     Will swab for COVID and flu. Tamiflu if flu positive. He is at high risk of severe covid infection. GFR nl. Has DM and BMI elevated. If COVID positive, needs paxlovid. Final Clinical Impressions(s) / UC Diagnoses   Final diagnoses:  Upper respiratory infection with cough and congestion     Discharge Instructions       You have been swabbed for COVID, and the test will result in the next 24 hours. Our staff will call you if positive. If the test is positive, you should quarantine for 5 days.   Mucinex for congestion; tylenol as needed for aches.     ED Prescriptions   None    PDMP not reviewed this encounter.   Barrett Henle, MD 07/02/21 1001

## 2021-07-02 NOTE — ED Triage Notes (Signed)
Pt reports having congestion, cough that began yesterday.

## 2021-07-02 NOTE — Discharge Instructions (Addendum)
°  You have been swabbed for COVID, and the test will result in the next 24 hours. Our staff will call you if positive. If the test is positive, you should quarantine for 5 days.   Mucinex for congestion; tylenol as needed for aches.

## 2021-07-03 ENCOUNTER — Other Ambulatory Visit: Payer: Self-pay

## 2021-07-03 DIAGNOSIS — E1140 Type 2 diabetes mellitus with diabetic neuropathy, unspecified: Secondary | ICD-10-CM | POA: Insufficient documentation

## 2021-07-03 DIAGNOSIS — I10 Essential (primary) hypertension: Secondary | ICD-10-CM

## 2021-07-03 NOTE — Telephone Encounter (Signed)
PA reattempted and denied.

## 2021-07-03 NOTE — Telephone Encounter (Signed)
Please send under the diabetic neuropathic pain code.  He is a diabetic.  Thanks.

## 2021-07-04 ENCOUNTER — Other Ambulatory Visit: Payer: Self-pay

## 2021-07-04 LAB — COVID-19, FLU A+B NAA
Influenza A, NAA: NOT DETECTED
Influenza B, NAA: NOT DETECTED
SARS-CoV-2, NAA: NOT DETECTED

## 2021-07-05 ENCOUNTER — Ambulatory Visit (INDEPENDENT_AMBULATORY_CARE_PROVIDER_SITE_OTHER): Payer: BC Managed Care – PPO | Admitting: Family Medicine

## 2021-07-05 ENCOUNTER — Telehealth: Payer: Self-pay | Admitting: Family Medicine

## 2021-07-05 ENCOUNTER — Encounter: Payer: Self-pay | Admitting: Family Medicine

## 2021-07-05 VITALS — BP 131/83 | HR 83 | Temp 98.8°F | Ht 73.5 in | Wt 383.0 lb

## 2021-07-05 DIAGNOSIS — E1149 Type 2 diabetes mellitus with other diabetic neurological complication: Secondary | ICD-10-CM | POA: Diagnosis not present

## 2021-07-05 DIAGNOSIS — E785 Hyperlipidemia, unspecified: Secondary | ICD-10-CM

## 2021-07-05 DIAGNOSIS — I10 Essential (primary) hypertension: Secondary | ICD-10-CM

## 2021-07-05 DIAGNOSIS — M5416 Radiculopathy, lumbar region: Secondary | ICD-10-CM

## 2021-07-05 DIAGNOSIS — E1169 Type 2 diabetes mellitus with other specified complication: Secondary | ICD-10-CM | POA: Diagnosis not present

## 2021-07-05 DIAGNOSIS — F418 Other specified anxiety disorders: Secondary | ICD-10-CM | POA: Diagnosis not present

## 2021-07-05 DIAGNOSIS — Z9989 Dependence on other enabling machines and devices: Secondary | ICD-10-CM

## 2021-07-05 DIAGNOSIS — G4733 Obstructive sleep apnea (adult) (pediatric): Secondary | ICD-10-CM

## 2021-07-05 DIAGNOSIS — Z6841 Body Mass Index (BMI) 40.0 and over, adult: Secondary | ICD-10-CM | POA: Insufficient documentation

## 2021-07-05 LAB — POCT GLYCOSYLATED HEMOGLOBIN (HGB A1C)
HbA1c POC (<> result, manual entry): 7.4 % (ref 4.0–5.6)
HbA1c, POC (controlled diabetic range): 7.4 % — AB (ref 0.0–7.0)
HbA1c, POC (prediabetic range): 7.4 % — AB (ref 5.7–6.4)
Hemoglobin A1C: 7.4 % — AB (ref 4.0–5.6)

## 2021-07-05 MED ORDER — OZEMPIC (0.25 OR 0.5 MG/DOSE) 2 MG/1.5ML ~~LOC~~ SOPN: 0.5000 mg | PEN_INJECTOR | SUBCUTANEOUS | 0 refills | Status: DC

## 2021-07-05 MED ORDER — HYDROCHLOROTHIAZIDE 25 MG PO TABS: 25.0000 mg | ORAL_TABLET | Freq: Every day | ORAL | 1 refills | Status: DC

## 2021-07-05 MED ORDER — METFORMIN HCL 1000 MG PO TABS
1000.0000 mg | ORAL_TABLET | Freq: Two times a day (BID) | ORAL | 1 refills | Status: DC
Start: 2021-07-05 — End: 2021-10-17

## 2021-07-05 MED ORDER — PREGABALIN 150 MG PO CAPS: 150.0000 mg | ORAL_CAPSULE | Freq: Two times a day (BID) | ORAL | 1 refills | Status: DC

## 2021-07-05 MED ORDER — SEMAGLUTIDE (1 MG/DOSE) 4 MG/3ML ~~LOC~~ SOPN: 1.0000 mg | PEN_INJECTOR | SUBCUTANEOUS | 5 refills | Status: DC

## 2021-07-05 MED ORDER — MELOXICAM 15 MG PO TABS: 15.0000 mg | ORAL_TABLET | Freq: Every day | ORAL | 1 refills | Status: DC

## 2021-07-05 MED ORDER — DULOXETINE HCL 60 MG PO CPEP: 60.0000 mg | ORAL_CAPSULE | Freq: Every day | ORAL | 1 refills | Status: DC

## 2021-07-05 MED ORDER — ATORVASTATIN CALCIUM 20 MG PO TABS
ORAL_TABLET | ORAL | 3 refills | Status: DC
Start: 2021-07-05 — End: 2021-10-17

## 2021-07-05 MED ORDER — LISINOPRIL 20 MG PO TABS
10.0000 mg | ORAL_TABLET | Freq: Every day | ORAL | 1 refills | Status: DC
Start: 2021-07-05 — End: 2021-10-17

## 2021-07-05 NOTE — Progress Notes (Signed)
This visit occurred during the SARS-CoV-2 public health emergency.  Safety protocols were in place, including screening questions prior to the visit, additional usage of staff PPE, and extensive cleaning of exam room while observing appropriate contact time as indicated for disinfecting solutions.    Patient ID: Christopher Ingram, male  DOB: Jul 23, 1975, 46 y.o.   MRN: [id number] Patient Care Team    Relationship Specialty Notifications Start End  [name], DO PCP - General Family Medicine  01/03/16   [name], MD Consulting Physician Pulmonary Disease  10/11/20   [name], [name], MD Consulting Physician Gastroenterology  02/21/21     Chief Complaint  Patient presents with   Diabetes    [place]; pt is fasting    Subjective: Christopher Ingram is a 46 y.o. male present for cmc  All past medical history, surgical history, allergies, family history, immunizations, medications and social history were updated in the electronic medical record today. All recent labs, ED visits and hospitalizations within the last year were reviewed.  Depression/anxiety/neuropathy-lumbar pain: Patient reports compliance cymbalta 60 mg daily and Lyrica 150 mg twice daily.  He feels this combination is working very well for his depression, anxiety and low back pain/diabetic neuropathy.   Tired meds in the past: zoloft. Prozac   Hypertension/morbid obesity/elevated tg : Pt reports compliance with his HCTZ 25 mg and lisinopril 10 mg. He is also eating a low salt diet.. Patient denies chest pain, shortness of breath, dizziness or lower extremity edema.  He is tolerating statin.  Diet: low sodium, low carb Exercise: routinely RF: HTN, HLD, DM, obesity, FH HD (father MI 1)  Diabetes:  Diagnosed 10/2018 with an a1c of 9.5.  Pt did well with janumet-- unfortunately it is now too expensive.Patient denies dizziness, hyperglycemic or hypoglycemic events. Patient denies any new numbness, tingling in the extremities or  nonhealing wounds of feet.  He has chronic neuropathy in which she states the Lyrica is working well for him. Patient reports compliance with metformin 1000 mg daily and Rybelsus 7 mg daily.   Depression screen [facility] 2/9 07/05/2021 02/21/2021 11/22/2020 10/11/2020 07/14/2020  Decreased Interest '1 1 1 ' 0 2  Down, Depressed, Hopeless 0 0 2 0 3  PHQ - 2 Score '1 1 3 ' 0 5  Altered sleeping 2 0 '1 1 3  ' Tired, decreased energy '2 1 2 1 2  ' Change in appetite 0 '2 3 2 3  ' Feeling bad or failure about yourself  0 0 0 0 1  Trouble concentrating 0 0 1 0 0  Moving slowly or fidgety/restless 0 0 0 0 0  Suicidal thoughts 0 0 0 0 0  PHQ-9 Score '5 4 10 4 14  ' Difficult doing work/chores - - - - -  Some recent data might be hidden   GAD 7 : Generalized Anxiety Score 07/05/2021 02/21/2021 11/22/2020 10/11/2020  Nervous, Anxious, on Edge '1 1 2 ' 0  Control/stop worrying 0 0 1 0  Worry too much - different things 1 0 2 0  Trouble relaxing 1 0 2 1  Restless 0 0 1 0  Easily annoyed or irritable '2 1 2 1  ' Afraid - awful might happen 0 0 0 0  Total GAD 7 Score '5 2 10 2  ' Anxiety Difficulty - - - -         Fall Risk  01/24/2016 01/03/2016  Falls in the past year? No No      Immunization History  Administered Date(s) Administered  Influenza,inj,Quad PF,6+ Mos 05/15/2016, 04/07/2017, 01/26/2019, 02/21/2021   Influenza-Unspecified 03/19/2018, 04/01/2020   Moderna Covid-19 Vaccine Bivalent Booster 15yr & up 05/17/2021   PFIZER(Purple Top)SARS-COV-2 Vaccination 08/26/2019, 09/20/2019, 04/25/2020   Pneumococcal Polysaccharide-23 01/26/2019   Tdap 10/31/2011, 02/21/2021     Past Medical History:  Diagnosis Date   Allergy    Anterior femoral cutaneous neuropathy of right lower extremity    Anxiety    Asthma    Chicken pox    Depression, major    GERD (gastroesophageal reflux disease)    Hemorrhoids 11/03/2020   Hypertension    Internal hemorrhoids    rectal bleeding, colonoscopy   Migraine    OSA on CPAP 04/2012    AHI23   RAD (reactive airway disease)    ? 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  Allergies  Allergen Reactions   Aspirin     hives   Past Surgical History:  Procedure Laterality Date   EYE SURGERY     lazy eye   Family History  Problem Relation Age of Onset   Diabetes Mother    Colon polyps Mother    Diabetes Father    Early death Father        heart attack 558  Breast cancer Sister    Liver cancer Sister    Mental illness Brother    Colon cancer Neg Hx    Esophageal cancer Neg Hx    Inflammatory bowel disease Neg Hx    Pancreatic cancer Neg Hx    Rectal cancer Neg Hx    Stomach cancer Neg Hx    Social History   Social History Narrative   Married to [employer]([place]. Has one child named [name](special needs).    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    Drinks caffeine. Takes a daily vitamin.    Wears seatbelt. Smoke detector in the home.    Exercises routinely.    Feels safe in his relationships     Allergies as of 07/05/2021       Reactions   Aspirin    hives        Medication List        Accurate as of July 05, 2021 11:59 PM. If you have any questions, ask your nurse or doctor.          STOP taking these medications    accu-chek soft touch lancets Stopped by: RHoward Pouch DO   acetaminophen 500 MG tablet Commonly known as: TYLENOL Stopped by: RHoward Pouch DO   Rybelsus 7 MG Tabs Generic drug: Semaglutide Replaced by: Ozempic (0.25 or 0.5 MG/DOSE) 2 MG/1.5ML Sopn Stopped by: RHoward Pouch DO       TAKE these medications    Accu-Chek Guide test strip Generic drug: glucose blood USE TO CHECK FASTING BLOOD SUGAR DAILY   Albuterol Sulfate 108 (90 Base) MCG/ACT Aepb Commonly known as: ProAir RespiClick Inhale 2 puffs into the lungs every 4 (four) hours as needed.   atorvastatin 20 MG  tablet Commonly known as: LIPITOR Take 1 tablet (20 mg) by mouth daily.   blood glucose meter kit and supplies Dispense based on patient and insurance preference. Use daily to check fasting blood glucose  (FOR ICD-10 E11.9).   DULoxetine 60 MG capsule Commonly known as: Cymbalta Take 1 capsule (60 mg  total) by mouth daily.   hydrochlorothiazide 25 MG tablet Commonly known as: HYDRODIURIL Take 1 tablet (25 mg total) by mouth daily.   hydrocortisone 25 MG suppository Commonly known as: ANUSOL-HC Place 1 suppository (25 mg total) rectally every 12 (twelve) hours.   lisinopril 20 MG tablet Commonly known as: ZESTRIL Take 0.5 tablets (10 mg total) by mouth daily. What changed: medication strength Changed by: Howard Pouch, DO   loratadine 10 MG tablet Commonly known as: CLARITIN Take 10 mg by mouth daily.   meloxicam 15 MG tablet Commonly known as: MOBIC Take 1 tablet (15 mg total) by mouth daily.   metFORMIN 1000 MG tablet Commonly known as: GLUCOPHAGE Take 1 tablet (1,000 mg total) by mouth 2 (two) times daily with a meal.   Ozempic (0.25 or 0.5 MG/DOSE) 2 MG/1.5ML Sopn Generic drug: Semaglutide(0.25 or 0.5MG/DOS) Inject 0.5 mg into the skin once a week. Replaces: Rybelsus 7 MG Tabs Started by: Howard Pouch, DO   Semaglutide (1 MG/DOSE) 4 MG/3ML Sopn Inject 1 mg as directed once a week. Started by: Howard Pouch, DO   pregabalin 150 MG capsule Commonly known as: LYRICA Take 1 capsule (150 mg total) by mouth 2 (two) times daily. What changed:  how much to take how to take this when to take this additional instructions Changed by: Howard Pouch, DO       All past medical history, surgical history, allergies, family history, immunizations andmedications were updated in the EMR today and reviewed under the history and medication portions of their EMR.     Recent Results (from the past 2160 hour(s))  Covid-19, Flu A+B ([facility])     Status: None   Collection Time:  07/02/21 10:09 AM   Specimen: Nasopharyngeal   Naso  Result Value Ref Range   SARS-CoV-2, NAA Not Detected Not Detected   Influenza A, NAA Not Detected Not Detected   Influenza B, NAA Not Detected Not Detected   Test Information: Comment     Comment: This nucleic acid amplification test was developed and its performance characteristics determined by [employer], [employer] and Company. Nucleic acid amplification tests include RT-PCR and TMA. This test has not been FDA cleared or approved. This test has been authorized by FDA under an Emergency Use Authorization (EUA). This test is only authorized for the duration of time the declaration that circumstances exist justifying the authorization of the emergency use of in vitro diagnostic tests for detection of SARS-CoV-2 virus and/or diagnosis of COVID-19 infection under section 564(b)(1) of the Act, 21 U.S.C. 767MCN-4(B) (1), unless the authorization is terminated or revoked sooner. When diagnostic testing is negative, the possibility of a false negative result should be considered in the context of a patient's recent exposures and the presence of clinical signs and symptoms consistent with COVID-19. An individual without symptoms of COVID-19 and who is not shedding SARS-CoV-2 virus wo uld expect to have a negative (not detected) result in this assay.   POCT HgB A1C     Status: Abnormal   Collection Time: 07/05/21  8:56 AM  Result Value Ref Range   Hemoglobin A1C 7.4 (A) 4.0 - 5.6 %   HbA1c POC (<> result, manual entry) 7.4 4.0 - 5.6 %   HbA1c, POC (prediabetic range) 7.4 (A) 5.7 - 6.4 %   HbA1c, POC (controlled diabetic range) 7.4 (A) 0.0 - 7.0 %    DG Lumbar Spine Complete  Result Date: 11/11/2018  IMPRESSION: Mild degenerative disc disease. Electronically Signed   By: Dorise Bullion  III M.D   On: 11/11/2018 17:23     ROS: 14 pt review of systems performed and negative (unless mentioned in an HPI)  Objective: BP 131/83    Pulse 83    Temp  98.8 F (37.1 C) (Oral)    Ht 6' 1.5" (1.867 m)    Wt (!) 383 lb (173.7 kg)    SpO2 97%    BMI 49.85 kg/m  Physical Exam Vitals and nursing note reviewed.  Constitutional:      General: He is not in acute distress.    Appearance: Normal appearance. He is obese. He is not ill-appearing, toxic-appearing or diaphoretic.  HENT:     Head: Normocephalic and atraumatic.     Mouth/Throat:     Mouth: Mucous membranes are moist.  Eyes:     General: No scleral icterus.       Right eye: No discharge.        Left eye: No discharge.     Extraocular Movements: Extraocular movements intact.     Pupils: Pupils are equal, round, and reactive to light.  Cardiovascular:     Rate and Rhythm: Normal rate and regular rhythm.  Pulmonary:     Effort: Pulmonary effort is normal. No respiratory distress.     Breath sounds: Normal breath sounds. No wheezing, rhonchi or rales.  Skin:    General: Skin is warm and dry.     Coloration: Skin is not jaundiced or pale.     Findings: No rash.  Neurological:     Mental Status: He is alert and oriented to person, place, and time. Mental status is at baseline.  Psychiatric:        Mood and Affect: Mood normal.        Behavior: Behavior normal.        Thought Content: Thought content normal.        Judgment: Judgment normal.     No results found.  Assessment/plan: Rondey Fallen is a 46 y.o. male present for CPE/CMC Lumbar radiculopathy/back pain/obesity -Condition is well controlled - Continue Lyrica 150 mg twice daily. - Continue Cymbalta 60 mg daily. - Continue Mobic 15 mg daily with food. - Xray completed 11/2018.Mild multilevel degenerative disc disease with small anterior osteophytes. No fracture or traumatic malalignment. -Accommodation form for employer completed today. -Trial of Elavil; was not helpful.    Anxiety with depression -Stable.   -Continue Cymbalta to 60 mg daily -Continue Lyrica 150 mg twice daily (dx: Diabetic neuropathy)  Essential  hypertension, benign/elevated Tg/morbid obesity -Stable -  goal of 130/80.  -Continue HCTZ 25 mg QD -Continue lisinopril 10 mg qd. - low salt diet.  Type 2 diabetes mellitus with hyperlipidemia (HCC) -A1c increased today. - Continue the diet and weight loss -Continue metformin to 1000 mg twice daily. - Continue atorvastatin 20 mg daily -Discontinue Rybelsus 7 mg daily, start Ozempic 1 mg injection weekly - Janumet was too expensive PNA series: pneumovax completed  Flu shot: completed (recommneded yearly) Foot exam: 07/2021 Eye exam: completed 06/2020- has appt [facility]: will collect next visit. A1c: 9.5>5.8> 5.8>6.3> 6.7>6.4> 7.4 collected today F/u 4 months    Return in about 15 weeks (around 10/18/2021) for CMC (30 min).  Orders Placed This Encounter  Procedures   POCT HgB A1C    Meds ordered this encounter  Medications   atorvastatin (LIPITOR) 20 MG tablet    Sig: Take 1 tablet (20 mg) by mouth daily.    Dispense:  90 tablet  Refill:  3   DULoxetine (CYMBALTA) 60 MG capsule    Sig: Take 1 capsule (60 mg total) by mouth daily.    Dispense:  90 capsule    Refill:  1   hydrochlorothiazide (HYDRODIURIL) 25 MG tablet    Sig: Take 1 tablet (25 mg total) by mouth daily.    Dispense:  90 tablet    Refill:  1   lisinopril (ZESTRIL) 20 MG tablet    Sig: Take 0.5 tablets (10 mg total) by mouth daily.    Dispense:  90 tablet    Refill:  1   meloxicam (MOBIC) 15 MG tablet    Sig: Take 1 tablet (15 mg total) by mouth daily.    Dispense:  90 tablet    Refill:  1   metFORMIN (GLUCOPHAGE) 1000 MG tablet    Sig: Take 1 tablet (1,000 mg total) by mouth 2 (two) times daily with a meal.    Dispense:  180 tablet    Refill:  1   Semaglutide,0.25 or 0.5MG/DOS, (OZEMPIC, 0.25 OR 0.5 MG/DOSE,) 2 MG/1.5ML SOPN    Sig: Inject 0.5 mg into the skin once a week.    Dispense:  1.5 mL    Refill:  0   Semaglutide, 1 MG/DOSE, 4 MG/3ML SOPN    Sig: Inject 1 mg as directed once a week.     Dispense:  3 mL    Refill:  5    Script #2   pregabalin (LYRICA) 150 MG capsule    Sig: Take 1 capsule (150 mg total) by mouth 2 (two) times daily.    Dispense:  180 capsule    Refill:  1   Referral Orders  No referral(s) requested today     Note is dictated utilizing voice recognition software. Although note has been proof read prior to signing, occasional typographical errors still can be missed. If any questions arise, please do not hesitate to call for verification.  Electronically signed by: Howard Pouch, DO [place]

## 2021-07-05 NOTE — Telephone Encounter (Signed)
Pt cell: [phone] Question about Medication, concerned about doasge  Ozempic (0.25 or 0.5 MG/DOSE) Semaglutide,0.25 or 0.5MG /DOS, (OZEMPIC, 0.25 OR 0.5 MG/DOSE,) 2 MG/1.5ML SOPN

## 2021-07-05 NOTE — Telephone Encounter (Signed)
Called pt who stated that pharmacy did not have the lower dose of medication but did pick up the higher dose. Pt would like to know what can he do. Pt did confirm that the medication is a pen so dosage cannot be adjusted. Pt will hold on using rx until receive further instructions.

## 2021-07-05 NOTE — Telephone Encounter (Signed)
Fergus Fauver (Key: [id number])  PA resubmitted and marked as Urgent.

## 2021-07-09 NOTE — Telephone Encounter (Signed)
No action needed

## 2021-07-09 NOTE — Telephone Encounter (Signed)
Since he was already on rybelsus prior, he likely could just start with the 1mg  weekly dose.  He may experience nausea for a few days, which is common after increasing the doses of these meds. If he does experience - it usually passes in a few days.   Start with the 1mg  ozempic weekly.

## 2021-07-09 NOTE — Telephone Encounter (Signed)
LM for pt to return call to discuss.  

## 2021-07-19 ENCOUNTER — Other Ambulatory Visit: Payer: Self-pay

## 2021-07-19 NOTE — Telephone Encounter (Signed)
Calling to request new prescription for blood sugar supplies.  Mail order pharmacy  - 90 d/s  [facility] mail order  Meter and supplies for Dexcom G6

## 2021-07-20 MED ORDER — DEXCOM G6 SENSOR MISC: 11 refills | Status: DC

## 2021-07-20 MED ORDER — DEXCOM G6 TRANSMITTER MISC: 5 refills | Status: DC

## 2021-07-20 MED ORDER — DEXCOM G6 RECEIVER DEVI: 0 refills | Status: DC

## 2021-07-20 NOTE — Telephone Encounter (Signed)
Orders pended. Please advise on sig.

## 2021-08-03 LAB — HM DIABETES EYE EXAM

## 2021-08-08 ENCOUNTER — Ambulatory Visit (INDEPENDENT_AMBULATORY_CARE_PROVIDER_SITE_OTHER): Payer: BC Managed Care – PPO | Admitting: Nurse Practitioner

## 2021-08-08 ENCOUNTER — Encounter: Payer: Self-pay | Admitting: Nurse Practitioner

## 2021-08-08 ENCOUNTER — Other Ambulatory Visit: Payer: Self-pay

## 2021-08-08 VITALS — BP 120/84 | HR 136 | Temp 96.8°F | Wt 371.4 lb

## 2021-08-08 DIAGNOSIS — K529 Noninfective gastroenteritis and colitis, unspecified: Secondary | ICD-10-CM | POA: Insufficient documentation

## 2021-08-08 MED ORDER — ONDANSETRON 4 MG PO TBDP: 4.0000 mg | ORAL_TABLET | Freq: Three times a day (TID) | ORAL | 0 refills | Status: DC | PRN

## 2021-08-08 NOTE — Patient Instructions (Signed)
It was great to see you! ? ?Take zofran 1 tablet every 8 hours as needed. Try to drink a sip of fluid (water, gatorade) and if able to tolerate take another sip 15 minutes later. If you experience ongoing elevated heart rate, unable to keep water down, or develop dizziness or headaches, go to the ER as you may need IV fluids.  ? ?Let's follow-up if your symptoms don't improve or worsen.  ? ?Take care, ? ?Vance Peper, NP ? ?

## 2021-08-08 NOTE — Progress Notes (Signed)
Acute Office Visit  Subjective:    Patient ID: Christopher Ingram, male    DOB: [birth year], 46 y.o.   MRN: [id number]  Chief Complaint  Patient presents with   Nausea    Pt c/o nausea, diarrhea, and some dizziness x1 day    HPI Patient is in today for nausea, vomiting, and diarrhea that started this morning. He denies fevers and headaches. He has not eaten food or been able to drink water recently. He has been having some dizziness. He was having abdominal pain this morning, however that has gone away.  States that his niece who lives with them had COVID a couple weeks ago.  He endorses some nasal congestion and rhinorrhea however he thinks it is due to the recent pollen.  He started ozempic about 2 months ago. He noticed decreased appetite and weight loss. He noticed that he has to avoid greasy foods or it causes diarrhea. He noticed his blood sugars have dropped while taking both ozempic and metformin. He stopped taking metformin. His sugars have not gone above 152 since starting Ozempic.   Past Medical History:  Diagnosis Date   Allergy    Anterior femoral cutaneous neuropathy of right lower extremity    Anxiety    Asthma    Chicken pox    Depression, major    GERD (gastroesophageal reflux disease)    Hemorrhoids 11/03/2020   Hypertension    Internal hemorrhoids    rectal bleeding, colonoscopy   Migraine    OSA on CPAP 04/2012   AHI23   RAD (reactive airway disease)    ? 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[facility]    Past Surgical History:  Procedure Laterality Date   EYE SURGERY     lazy eye    Family History  Problem Relation Age of Onset   Diabetes Mother    Colon polyps Mother    Diabetes Father    Early death Father        heart attack 67   Breast cancer Sister    Liver cancer Sister    Mental illness Brother    Colon cancer Neg Hx    Esophageal cancer Neg Hx    Inflammatory  bowel disease Neg Hx    Pancreatic cancer Neg Hx    Rectal cancer Neg Hx    Stomach cancer Neg Hx     Social History   Socioeconomic History   Marital status: Married    Spouse name: Not on file   Number of children: Not on file   Years of education: Not on file   Highest education level: Not on file  Occupational History   Not on file  Tobacco Use   Smoking status: Never   Smokeless tobacco: Never  Vaping Use   Vaping Use: Never used  Substance and Sexual Activity   Alcohol use: No   Drug use: No   Sexual activity: Yes    Partners: Female    Birth control/protection: None    Comment: married  Other Topics Concern   Not on file  Social History Narrative   Married to [employer] [place]). Has one child named [name] (special needs).    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    Denies tobacco, drugs or etoh.    Drinks caffeine. Takes a daily vitamin.    Wears seatbelt. Smoke detector in  the home.    Exercises routinely.    Feels safe in his relationships    Social Determinants of Health   Financial Resource Strain: Not on file  Food Insecurity: Not on file  Transportation Needs: Not on file  Physical Activity: Not on file  Stress: Not on file  Social Connections: Not on file  Intimate Partner Violence: Not on file    Outpatient Medications Prior to Visit  Medication Sig Dispense Refill   Continuous Blood Gluc Receiver (DEXCOM G6 RECEIVER) DEVI Monitor blood glucose TID 1 each 0   Continuous Blood Gluc Sensor (DEXCOM G6 SENSOR) MISC Monitor blood glucose TID 3 each 11   Continuous Blood Gluc Transmit (DEXCOM G6 TRANSMITTER) MISC Monitor blood glucose TID 1 each 5   ACCU-CHEK GUIDE test strip USE TO CHECK FASTING BLOOD SUGAR DAILY     Albuterol Sulfate (PROAIR RESPICLICK) 330 (90 Base) MCG/ACT AEPB Inhale 2 puffs into the lungs every 4 (four) hours as needed. 1 each 0   atorvastatin (LIPITOR) 20 MG tablet Take 1 tablet (20 mg) by mouth daily. 90 tablet 3    blood glucose meter kit and supplies Dispense based on patient and insurance preference. Use daily to check fasting blood glucose  (FOR ICD-10 E11.9). 1 each 11   DULoxetine (CYMBALTA) 60 MG capsule Take 1 capsule (60 mg total) by mouth daily. 90 capsule 1   hydrochlorothiazide (HYDRODIURIL) 25 MG tablet Take 1 tablet (25 mg total) by mouth daily. 90 tablet 1   hydrocortisone (ANUSOL-HC) 25 MG suppository Place 1 suppository (25 mg total) rectally every 12 (twelve) hours. 12 suppository 1   lisinopril (ZESTRIL) 20 MG tablet Take 0.5 tablets (10 mg total) by mouth daily. 90 tablet 1   loratadine (CLARITIN) 10 MG tablet Take 10 mg by mouth daily.     meloxicam (MOBIC) 15 MG tablet Take 1 tablet (15 mg total) by mouth daily. (Patient not taking: Reported on 08/08/2021) 90 tablet 1   metFORMIN (GLUCOPHAGE) 1000 MG tablet Take 1 tablet (1,000 mg total) by mouth 2 (two) times daily with a meal. 180 tablet 1   pregabalin (LYRICA) 150 MG capsule Take 1 capsule (150 mg total) by mouth 2 (two) times daily. 180 capsule 1   Semaglutide, 1 MG/DOSE, 4 MG/3ML SOPN Inject 1 mg as directed once a week. 3 mL 5   Semaglutide,0.25 or 0.5MG/DOS, (OZEMPIC, 0.25 OR 0.5 MG/DOSE,) 2 MG/1.5ML SOPN Inject 0.5 mg into the skin once a week. 1.5 mL 0   No facility-administered medications prior to visit.    Allergies  Allergen Reactions   Aspirin     hives    Review of Systems See pertinent positives and negatives per HPI.    Objective:    Physical Exam Vitals and nursing note reviewed.  Constitutional:      Appearance: Normal appearance.  HENT:     Head: Normocephalic.  Eyes:     Conjunctiva/sclera: Conjunctivae normal.  Cardiovascular:     Rate and Rhythm: Regular rhythm. Tachycardia present.     Pulses: Normal pulses.     Heart sounds: Normal heart sounds.  Pulmonary:     Effort: Pulmonary effort is normal.     Breath sounds: Normal breath sounds.  Abdominal:     Palpations: Abdomen is soft.      Tenderness: There is no abdominal tenderness. There is no guarding or rebound.  Musculoskeletal:     Cervical back: Normal range of motion.  Skin:    General: Skin  is warm.     Comments: No tenting of skin  Neurological:     General: No focal deficit present.     Mental Status: He is alert and oriented to person, place, and time.  Psychiatric:        Mood and Affect: Mood normal.        Behavior: Behavior normal.        Thought Content: Thought content normal.        Judgment: Judgment normal.    BP 120/84 (BP Location: Right Arm, Cuff Size: Large)    Pulse (!) 136    Temp (!) 96.8 F (36 C) (Temporal)    Wt (!) 371 lb 6.4 oz (168.5 kg)    SpO2 97%    BMI 48.34 kg/m  Wt Readings from Last 3 Encounters:  08/08/21 (!) 371 lb 6.4 oz (168.5 kg)  07/05/21 (!) 383 lb (173.7 kg)  02/21/21 (!) 387 lb (175.5 kg)    There are no preventive care reminders to display for this patient.  There are no preventive care reminders to display for this patient.   Lab Results  Component Value Date   TSH 1.03 02/21/2021   Lab Results  Component Value Date   WBC 8.5 02/21/2021   HGB 13.7 02/21/2021   HCT 41.6 02/21/2021   MCV 86.0 02/21/2021   PLT 286.0 02/21/2021   Lab Results  Component Value Date   NA 140 02/21/2021   K 4.2 02/21/2021   CO2 25 02/21/2021   GLUCOSE 103 (H) 02/21/2021   BUN 20 02/21/2021   CREATININE 0.96 02/21/2021   BILITOT 0.4 02/21/2021   ALKPHOS 49 02/21/2021   AST 20 02/21/2021   ALT 30 02/21/2021   PROT 7.4 02/21/2021   ALBUMIN 4.4 02/21/2021   CALCIUM 9.9 02/21/2021   ANIONGAP 7 06/05/2015   GFR 94.35 02/21/2021   Lab Results  Component Value Date   CHOL 147 02/21/2021   Lab Results  Component Value Date   HDL 55.80 02/21/2021   Lab Results  Component Value Date   LDLCALC 60 02/21/2021   Lab Results  Component Value Date   TRIG 152.0 (H) 02/21/2021   Lab Results  Component Value Date   CHOLHDL 3 02/21/2021   Lab Results  Component  Value Date   HGBA1C 7.4 (A) 07/05/2021   HGBA1C 7.4 07/05/2021   HGBA1C 7.4 (A) 07/05/2021   HGBA1C 7.4 (A) 07/05/2021       Assessment & Plan:   Problem List Items Addressed This Visit       Digestive   Gastroenteritis - Primary    Most likely viral gastroenteritis.  He has been taking Ozempic for 2 months and I do not believe this is related to the medication.  Discussed taking a small sip of liquid waiting 15 minutes and taking another sip of liquids.  I will also give him some Zofran to take as needed for nausea and vomiting.  If he is not able to keep down fluids, develops dizziness, headaches encouraged him to go to the ER as he might need IV fluids.  His heart rate is slightly elevated today which is most likely due to acute illness.  We will check for COVID, flu, RSV with send out test.  Follow-up if symptoms do not improve or worsen.  Work note given.      Relevant Orders   COVID-19, Flu A+B and RSV     Meds ordered this encounter  Medications  ondansetron (ZOFRAN-ODT) 4 MG disintegrating tablet    Sig: Take 1 tablet (4 mg total) by mouth every 8 (eight) hours as needed for nausea or vomiting.    Dispense:  20 tablet    Refill:  0     Charyl Dancer, NP

## 2021-08-08 NOTE — Assessment & Plan Note (Addendum)
Most likely viral gastroenteritis.  He has been taking Ozempic for 2 months and I do not believe this is related to the medication.  Discussed taking a small sip of liquid waiting 15 minutes and taking another sip of liquids.  I will also give him some Zofran to take as needed for nausea and vomiting.  If he is not able to keep down fluids, develops dizziness, headaches encouraged him to go to the ER as he might need IV fluids.  His heart rate is slightly elevated today which is most likely due to acute illness.  We will check for COVID, flu, RSV with send out test.  Follow-up if symptoms do not improve or worsen.  Work note given. ?

## 2021-08-09 LAB — COVID-19, FLU A+B AND RSV
Influenza A, NAA: NOT DETECTED
Influenza B, NAA: NOT DETECTED
RSV, NAA: NOT DETECTED
SARS-CoV-2, NAA: NOT DETECTED

## 2021-10-17 ENCOUNTER — Encounter: Payer: Self-pay | Admitting: Family Medicine

## 2021-10-17 ENCOUNTER — Ambulatory Visit (INDEPENDENT_AMBULATORY_CARE_PROVIDER_SITE_OTHER): Payer: BC Managed Care – PPO | Admitting: Family Medicine

## 2021-10-17 VITALS — BP 118/81 | HR 77 | Temp 97.5°F | Ht 73.5 in | Wt 373.0 lb

## 2021-10-17 DIAGNOSIS — E1169 Type 2 diabetes mellitus with other specified complication: Secondary | ICD-10-CM

## 2021-10-17 DIAGNOSIS — I10 Essential (primary) hypertension: Secondary | ICD-10-CM | POA: Diagnosis not present

## 2021-10-17 DIAGNOSIS — F418 Other specified anxiety disorders: Secondary | ICD-10-CM

## 2021-10-17 DIAGNOSIS — Z9989 Dependence on other enabling machines and devices: Secondary | ICD-10-CM

## 2021-10-17 DIAGNOSIS — E785 Hyperlipidemia, unspecified: Secondary | ICD-10-CM

## 2021-10-17 DIAGNOSIS — G4733 Obstructive sleep apnea (adult) (pediatric): Secondary | ICD-10-CM | POA: Diagnosis not present

## 2021-10-17 DIAGNOSIS — E1149 Type 2 diabetes mellitus with other diabetic neurological complication: Secondary | ICD-10-CM

## 2021-10-17 DIAGNOSIS — Z6841 Body Mass Index (BMI) 40.0 and over, adult: Secondary | ICD-10-CM

## 2021-10-17 DIAGNOSIS — M5416 Radiculopathy, lumbar region: Secondary | ICD-10-CM

## 2021-10-17 LAB — POCT GLYCOSYLATED HEMOGLOBIN (HGB A1C)
HbA1c POC (<> result, manual entry): 5.9 % (ref 4.0–5.6)
HbA1c, POC (controlled diabetic range): 5.9 % (ref 0.0–7.0)
HbA1c, POC (prediabetic range): 5.9 % (ref 5.7–6.4)
Hemoglobin A1C: 5.9 % — AB (ref 4.0–5.6)

## 2021-10-17 MED ORDER — INSULIN PEN NEEDLE 32G X 6 MM MISC: 3 refills | Status: DC

## 2021-10-17 MED ORDER — HYDROCHLOROTHIAZIDE 25 MG PO TABS: 25.0000 mg | ORAL_TABLET | Freq: Every day | ORAL | 1 refills | Status: DC

## 2021-10-17 MED ORDER — METFORMIN HCL 1000 MG PO TABS: 1000.0000 mg | ORAL_TABLET | Freq: Two times a day (BID) | ORAL | 1 refills | Status: DC

## 2021-10-17 MED ORDER — VICTOZA 18 MG/3ML ~~LOC~~ SOPN: 1.2000 mg | PEN_INJECTOR | Freq: Every day | SUBCUTANEOUS | 2 refills | Status: DC

## 2021-10-17 MED ORDER — PREGABALIN 150 MG PO CAPS: 150.0000 mg | ORAL_CAPSULE | Freq: Two times a day (BID) | ORAL | 1 refills | Status: DC

## 2021-10-17 MED ORDER — DULOXETINE HCL 60 MG PO CPEP: 60.0000 mg | ORAL_CAPSULE | Freq: Every day | ORAL | 1 refills | Status: DC

## 2021-10-17 MED ORDER — ATORVASTATIN CALCIUM 20 MG PO TABS: ORAL_TABLET | ORAL | 3 refills | Status: DC

## 2021-10-17 MED ORDER — LISINOPRIL 20 MG PO TABS: 10.0000 mg | ORAL_TABLET | Freq: Every day | ORAL | 1 refills | Status: DC

## 2021-10-17 NOTE — Progress Notes (Signed)
   Patient ID: Christopher Ingram, male  DOB: 06/18/1975, 46 y.o.   MRN: [id number] Patient Care Team    Relationship Specialty Notifications Start End  Kuneff, Renee A, DO PCP - General Family Medicine  01/03/16   Sood, Vineet, MD Consulting Physician Pulmonary Disease  10/11/20   [name], Gabriel Jr., MD Consulting Physician Gastroenterology  02/21/21     Chief Complaint  Patient presents with   Diabetes   Depression   Hypertension    Subjective: Christopher Ingram is a 46 y.o. male present for cmc  All past medical history, surgical history, allergies, family history, immunizations, medications and social history were updated in the electronic medical record today. All recent labs, ED visits and hospitalizations within the last year were reviewed.  Depression/anxiety/neuropathy-lumbar pain: Patient reports compliance cymbalta 60 mg daily and Lyrica 150 mg twice daily.  He feels this combination is very well for his depression, anxiety and low back pain/diabetic neuropathy.   Tired meds in the past: zoloft. Prozac   Hypertension/morbid obesity/elevated tg : Pt reports compliance with his HCTZ 25 mg and lisinopril 10 mg. He is also eating a low salt diet.. Patient denies chest pain, shortness of breath, dizziness or lower extremity edema.  He is tolerating statin.  Diet: low sodium, low carb Exercise: routinely RF: HTN, HLD, DM, obesity, FH HD (father MI 53)  Diabetes:  Diagnosed 10/2018 with an a1c of 9.5.  Pt did well with janumet-- unfortunately it is now too expensive.Patient denies dizziness, hyperglycemic or hypoglycemic events. Patient denies any new numbness, tingling in the extremities or nonhealing wounds of feet.  He has chronic neuropathy in which she states the Lyrica is working well for him. Patient reports compliance with metformin 1000 mg daily and ozempic 0.5 mg qd.  Unfortunately his insurance will no longer cover the Ozempic. Down 13#      07/05/2021    8:46 AM 02/21/2021     9:57 AM 11/22/2020    2:52 PM 10/11/2020    8:53 AM 07/14/2020    3:16 PM  Depression screen PHQ 2/9  Decreased Interest 1 1 1 0 2  Down, Depressed, Hopeless 0 0 2 0 3  PHQ - 2 Score 1 1 3 0 5  Altered sleeping 2 0 1 1 3  Tired, decreased energy 2 1 2 1 2  Change in appetite 0 2 3 2 3  Feeling bad or failure about yourself  0 0 0 0 1  Trouble concentrating 0 0 1 0 0  Moving slowly or fidgety/restless 0 0 0 0 0  Suicidal thoughts 0 0 0 0 0  PHQ-9 Score 5 4 10 4 14      07/05/2021    8:46 AM 02/21/2021    9:58 AM 11/22/2020    2:52 PM 10/11/2020    8:54 AM  GAD 7 : Generalized Anxiety Score  Nervous, Anxious, on Edge 1 1 2 0  Control/stop worrying 0 0 1 0  Worry too much - different things 1 0 2 0  Trouble relaxing 1 0 2 1  Restless 0 0 1 0  Easily annoyed or irritable 2 1 2 1  Afraid - awful might happen 0 0 0 0  Total GAD 7 Score 5 2 10 2            01/24/2016    3:43 PM 01/03/2016    1:51 PM  Fall Risk   Falls in the past year? No No        Immunization History  Administered Date(s) Administered   Influenza,inj,Quad PF,6+ Mos 05/15/2016, 04/07/2017, 01/26/2019, 02/21/2021   Influenza-Unspecified 03/19/2018, 04/01/2020   Moderna Covid-19 Vaccine Bivalent Booster 81yr & up 05/17/2021   PFIZER(Purple Top)SARS-COV-2 Vaccination 08/26/2019, 09/20/2019, 04/25/2020   Pneumococcal Polysaccharide-23 01/26/2019   Tdap 10/31/2011, 02/21/2021     Past Medical History:  Diagnosis Date   Allergy    Anterior femoral cutaneous neuropathy of right lower extremity    Anxiety    Asthma    Chicken pox    Depression, major    GERD (gastroesophageal reflux disease)    Hemorrhoids 11/03/2020   Hypertension    Internal hemorrhoids    rectal bleeding, colonoscopy   Migraine    OSA on CPAP 04/2012   AHI23   RAD (reactive airway disease)    ? 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   Allergies  Allergen Reactions   Aspirin     hives   Past Surgical History:  Procedure Laterality Date   EYE SURGERY     lazy eye   Family History  Problem Relation Age of Onset   Diabetes Mother    Colon polyps Mother    Diabetes Father    Early death Father        heart attack 528  Breast cancer Sister    Liver cancer Sister    Mental illness Brother    Colon cancer Neg Hx    Esophageal cancer Neg Hx    Inflammatory bowel disease Neg Hx    Pancreatic cancer Neg Hx    Rectal cancer Neg Hx    Stomach cancer Neg Hx    Social History   Social History Narrative   Married to [employer]([place]. Has one child named Christopher Ingram(special needs).    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    Drinks caffeine. Takes a daily vitamin.    Wears seatbelt. Smoke detector in the home.    Exercises routinely.    Feels safe in his relationships     Allergies as of 10/17/2021       Reactions   Aspirin    hives        Medication List        Accurate as of Oct 17, 2021 11:59 PM. If you have any questions, ask your nurse or doctor.          STOP taking these medications    blood glucose meter kit and supplies Stopped by: RHoward Pouch DO   hydrocortisone 25 MG suppository Commonly known as: ANUSOL-HC Stopped by: RHoward Pouch DO   ondansetron 4 MG disintegrating tablet Commonly known as: ZOFRAN-ODT Stopped by: RHoward Pouch DO   Ozempic (0.25 or 0.5 MG/DOSE) 2 MG/1.5ML Sopn Generic drug: Semaglutide(0.25 or 0.5MG/DOS) Stopped by: RHoward Pouch DO   Semaglutide (1 MG/DOSE) 4 MG/3ML Sopn Stopped by: RHoward Pouch DO       TAKE these medications    Accu-Chek Guide test strip Generic drug: glucose blood USE TO CHECK FASTING BLOOD SUGAR DAILY   Albuterol Sulfate 108 (90 Base) MCG/ACT Aepb Commonly known as: ProAir RespiClick Inhale 2 puffs into the lungs every 4 (four) hours as needed.   atorvastatin 20 MG  tablet Commonly known as: LIPITOR Take 1 tablet (20 mg) by mouth daily.   Dexcom G6 Receiver DKerrin Mo  Monitor blood glucose TID   Dexcom G6 Sensor Misc Monitor blood glucose TID   Dexcom G6 Transmitter Misc Monitor blood glucose TID   DULoxetine 60 MG capsule Commonly known as: Cymbalta Take 1 capsule (60 mg total) by mouth daily.   hydrochlorothiazide 25 MG tablet Commonly known as: HYDRODIURIL Take 1 tablet (25 mg total) by mouth daily.   Insulin Pen Needle 32G X 6 MM Misc For once daily use with Victoza pen. Started by: Renee Kuneff, DO   lisinopril 20 MG tablet Commonly known as: ZESTRIL Take 0.5 tablets (10 mg total) by mouth daily.   loratadine 10 MG tablet Commonly known as: CLARITIN Take 10 mg by mouth daily.   meloxicam 15 MG tablet Commonly known as: MOBIC Take 1 tablet (15 mg total) by mouth daily.   metFORMIN 1000 MG tablet Commonly known as: GLUCOPHAGE Take 1 tablet (1,000 mg total) by mouth 2 (two) times daily with a meal.   pregabalin 150 MG capsule Commonly known as: LYRICA Take 1 capsule (150 mg total) by mouth 2 (two) times daily.   Victoza 18 MG/3ML Sopn Generic drug: liraglutide Inject 1.2 mg into the skin daily. Started by: Renee Kuneff, DO       All past medical history, surgical history, allergies, family history, immunizations andmedications were updated in the EMR today and reviewed under the history and medication portions of their EMR.       ROS: 14 pt review of systems performed and negative (unless mentioned in an HPI)  Objective: BP 118/81   Pulse 77   Temp (!) 97.5 F (36.4 C) (Oral)   Ht 6' 1.5" (1.867 m)   Wt (!) 373 lb (169.2 kg)   SpO2 98%   BMI 48.54 kg/m  Physical Exam Vitals and nursing note reviewed.  Constitutional:      General: He is not in acute distress.    Appearance: Normal appearance. He is obese. He is not ill-appearing, toxic-appearing or diaphoretic.  HENT:     Head: Normocephalic and atraumatic.      Mouth/Throat:     Mouth: Mucous membranes are moist.  Eyes:     General: No scleral icterus.       Right eye: No discharge.        Left eye: No discharge.     Extraocular Movements: Extraocular movements intact.     Pupils: Pupils are equal, round, and reactive to light.  Cardiovascular:     Rate and Rhythm: Normal rate and regular rhythm.  Pulmonary:     Effort: Pulmonary effort is normal. No respiratory distress.     Breath sounds: Normal breath sounds. No wheezing, rhonchi or rales.  Musculoskeletal:     Right lower leg: No edema.     Left lower leg: No edema.  Skin:    General: Skin is warm and dry.     Coloration: Skin is not jaundiced or pale.     Findings: No rash.  Neurological:     Mental Status: He is alert and oriented to person, place, and time. Mental status is at baseline.  Psychiatric:        Mood and Affect: Mood normal.        Behavior: Behavior normal.        Thought Content: Thought content normal.        Judgment: Judgment normal.   Diabetic Foot Exam - Simple   Simple Foot Form  10/17/2021  3:11 PM  Visual Inspection No deformities, no   ulcerations, no other skin breakdown bilaterally: Yes Sensation Testing Intact to touch and monofilament testing bilaterally: Yes Pulse Check Posterior Tibialis and Dorsalis pulse intact bilaterally: Yes Comments       No results found.  Assessment/plan: Khayri Kargbo is a 46 y.o. male present for CPE/CMC Lumbar radiculopathy/back pain/obesity - stable.  - continue Lyrica 150 mg twice daily. - continue Cymbalta 60 mg daily. - Continue Mobic 15 mg daily with food. - Xray completed 11/2018.Mild multilevel degenerative disc disease with small anterior osteophytes. No fracture or traumatic malalignment. -Accommodation form for employer completed in the past -Trial of Elavil; was not helpful.    Anxiety with depression - stable.  Continue  Cymbalta to 60 mg daily Continue Lyrica 150 mg twice daily (dx:  Diabetic neuropathy)  Essential hypertension, benign/elevated Tg/morbid obesity Stable -  goal of 130/80.  - continue HCTZ 25 mg QD -Continue lisinopril 10 mg qd. - low salt diet.  Type 2 diabetes mellitus with hyperlipidemia ([place]) -Looks great, unfortunately Ozempic no longer covered. - Continue the diet and weight loss -Continue metformin to 1000 mg twice daily. -Start Victoza 1.2 mg injection daily.  If tolerating may taper up next visit - Continue atorvastatin 20 mg daily - Janumet was too expensive.  Ozempic was great but then not covered. PNA series: pneumovax completed  Eye exam up-to-date 08/2021 Foot exam up-to-date 10/17/2021  Return in about 15 weeks (around 01/30/2022) for Routine chronic condition follow-up.  Orders Placed This Encounter  Procedures   Urine Albumin-Creatinine with uACR   POCT HgB A1C    Meds ordered this encounter  Medications   pregabalin (LYRICA) 150 MG capsule    Sig: Take 1 capsule (150 mg total) by mouth 2 (two) times daily.    Dispense:  180 capsule    Refill:  1   metFORMIN (GLUCOPHAGE) 1000 MG tablet    Sig: Take 1 tablet (1,000 mg total) by mouth 2 (two) times daily with a meal.    Dispense:  180 tablet    Refill:  1   lisinopril (ZESTRIL) 20 MG tablet    Sig: Take 0.5 tablets (10 mg total) by mouth daily.    Dispense:  90 tablet    Refill:  1   hydrochlorothiazide (HYDRODIURIL) 25 MG tablet    Sig: Take 1 tablet (25 mg total) by mouth daily.    Dispense:  90 tablet    Refill:  1   DULoxetine (CYMBALTA) 60 MG capsule    Sig: Take 1 capsule (60 mg total) by mouth daily.    Dispense:  90 capsule    Refill:  1   atorvastatin (LIPITOR) 20 MG tablet    Sig: Take 1 tablet (20 mg) by mouth daily.    Dispense:  90 tablet    Refill:  3   liraglutide (VICTOZA) 18 MG/3ML SOPN    Sig: Inject 1.2 mg into the skin daily.    Dispense:  3 mL    Refill:  2    Ozempic is no longer covered by insurance.  DC Ozempic   Insulin Pen Needle 32G X  6 MM MISC    Sig: For once daily use with Victoza pen.    Dispense:  90 each    Refill:  3    For use with victoza pen. E11.9   Referral Orders  No referral(s) requested today     Note is dictated utilizing voice recognition software. Although note has been proof read prior to signing, occasional  typographical errors still can be missed. If any questions arise, please do not hesitate to call for verification.  Electronically signed by: Renee Kuneff, DO [facility]   

## 2021-10-18 ENCOUNTER — Ambulatory Visit: Payer: BC Managed Care – PPO

## 2021-10-18 DIAGNOSIS — E1169 Type 2 diabetes mellitus with other specified complication: Secondary | ICD-10-CM | POA: Diagnosis not present

## 2021-10-18 DIAGNOSIS — E785 Hyperlipidemia, unspecified: Secondary | ICD-10-CM | POA: Diagnosis not present

## 2021-10-18 LAB — MICROALBUMIN / CREATININE URINE RATIO
Creatinine,U: 190.2 mg/dL
Microalb Creat Ratio: 4.6 mg/g (ref 0.0–30.0)
Microalb, Ur: 8.7 mg/dL — ABNORMAL HIGH (ref 0.0–1.9)

## 2021-10-22 ENCOUNTER — Telehealth: Payer: Self-pay | Admitting: Family Medicine

## 2021-10-22 NOTE — Telephone Encounter (Signed)
Please inform patient his urine is positive for some urine protein, however his protein creatinine ratio is still normal.  This is reassuring.  Adequate control blood pressure and diabetes will help maintain his kidney health.

## 2021-10-22 NOTE — Telephone Encounter (Signed)
LVM for pt to CB regarding results.  

## 2021-10-22 NOTE — Telephone Encounter (Signed)
Spoke with patient regarding results/recommendations.  

## 2022-01-09 ENCOUNTER — Ambulatory Visit (INDEPENDENT_AMBULATORY_CARE_PROVIDER_SITE_OTHER): Payer: BC Managed Care – PPO | Admitting: Family Medicine

## 2022-01-09 ENCOUNTER — Encounter: Payer: Self-pay | Admitting: Family Medicine

## 2022-01-09 VITALS — BP 119/82 | HR 70 | Temp 98.4°F | Ht 73.5 in | Wt 383.0 lb

## 2022-01-09 DIAGNOSIS — E785 Hyperlipidemia, unspecified: Secondary | ICD-10-CM | POA: Diagnosis not present

## 2022-01-09 DIAGNOSIS — E1169 Type 2 diabetes mellitus with other specified complication: Secondary | ICD-10-CM

## 2022-01-09 LAB — POCT GLYCOSYLATED HEMOGLOBIN (HGB A1C)
HbA1c POC (<> result, manual entry): 6.8 % (ref 4.0–5.6)
HbA1c, POC (controlled diabetic range): 6.8 % (ref 0.0–7.0)
HbA1c, POC (prediabetic range): 6.8 % — AB (ref 5.7–6.4)
Hemoglobin A1C: 6.8 % — AB (ref 4.0–5.6)

## 2022-01-09 MED ORDER — METFORMIN HCL 1000 MG PO TABS: 1000.0000 mg | ORAL_TABLET | Freq: Two times a day (BID) | ORAL | 1 refills | Status: DC

## 2022-01-09 MED ORDER — DULOXETINE HCL 60 MG PO CPEP: 60.0000 mg | ORAL_CAPSULE | Freq: Two times a day (BID) | ORAL | 1 refills | Status: DC

## 2022-01-09 MED ORDER — DAPAGLIFLOZIN PROPANEDIOL 10 MG PO TABS: 10.0000 mg | ORAL_TABLET | Freq: Every day | ORAL | 5 refills | Status: DC

## 2022-01-09 NOTE — Patient Instructions (Signed)
Return in about 12 weeks (around 04/03/2022) for Routine chronic condition follow-up.        Great to see you today.  I have refilled the medication(s) we provide.   If labs were collected, we will inform you of lab results once received either by echart message or telephone call.   - echart message- for normal results that have been seen by the patient already.   - telephone call: abnormal results or if patient has not viewed results in their echart.

## 2022-01-09 NOTE — Progress Notes (Signed)
Patient ID: Christopher Ingram, male  DOB: Oct 12, 1975, 46 y.o.   MRN: [id number] Patient Care Team    Relationship Specialty Notifications Start End  [name], DO PCP - General Family Medicine  01/03/16   [name], MD Consulting Physician Pulmonary Disease  10/11/20   [name], [name], MD Consulting Physician Gastroenterology  02/21/21     Chief Complaint  Patient presents with   Diabetes    Sugars inc to 241 yesterday; avg     Subjective: Christopher Ingram is a 46 y.o. male present for cmc with concerns of higher BG readings.  All past medical history, surgical history, allergies, family history, immunizations, medications and social history were updated in the electronic medical record today. All recent labs, ED visits and hospitalizations within the last year were reviewed.  Depression/anxiety/neuropathy-lumbar pain: Patient reports compliance cymbalta 60 mg daily and Lyrica 150 mg twice daily.  He feels this combination is very well for his depression, anxiety and low back pain/diabetic neuropathy.  He has noticed he has become a little bit more irritable as of late. Tired meds in the past: zoloft. Prozac   Hypertension/morbid obesity/elevated tg : Pt reports compliance with his HCTZ 25 mg and lisinopril 10 mg. He is also eating a low salt diet.. Patient denies chest pain, shortness of breath, dizziness or lower extremity edema.  He is tolerating statin.  Diet: low sodium, low carb Exercise: routinely RF: HTN, HLD, DM, obesity, FH HD (father MI 45)  Diabetes:  Diagnosed 10/2018 with an a1c of 9.5.  Pt did well with janumet-- unfortunately it is now too expensive. Patient did well with Ozempic and Victoza, again both became too expensive as formulary changed Patient denies dizziness, hyperglycemic or hypoglycemic events. Patient denies numbness, tingling in the extremities or nonhealing wounds of feet.  He has chronic neuropathy in which she states the Lyrica is working  well for him. Patient reports compliance with metformin 1000 mg daily and states he has been out of his Victoza for a month and could not afford medication.  He is seeing fasting sugars 150-176.      07/05/2021    8:46 AM 02/21/2021    9:57 AM 11/22/2020    2:52 PM 10/11/2020    8:53 AM 07/14/2020    3:16 PM  Depression screen PHQ 2/9  Decreased Interest '1 1 1 '$ 0 2  Down, Depressed, Hopeless 0 0 2 0 3  PHQ - 2 Score '1 1 3 '$ 0 5  Altered sleeping 2 0 '1 1 3  '$ Tired, decreased energy '2 1 2 1 2  '$ Change in appetite 0 '2 3 2 3  '$ Feeling bad or failure about yourself  0 0 0 0 1  Trouble concentrating 0 0 1 0 0  Moving slowly or fidgety/restless 0 0 0 0 0  Suicidal thoughts 0 0 0 0 0  PHQ-9 Score '5 4 10 4 14      '$ 07/05/2021    8:46 AM 02/21/2021    9:58 AM 11/22/2020    2:52 PM 10/11/2020    8:54 AM  GAD 7 : Generalized Anxiety Score  Nervous, Anxious, on Edge '1 1 2 '$ 0  Control/stop worrying 0 0 1 0  Worry too much - different things 1 0 2 0  Trouble relaxing 1 0 2 1  Restless 0 0 1 0  Easily annoyed or irritable '2 1 2 1  '$ Afraid - awful might happen 0 0 0 0  Total GAD  7 Score '5 2 10 2           '$ 01/24/2016    3:43 PM 01/03/2016    1:51 PM  [place] in the past year? No No   Immunization History  Administered Date(s) Administered   Influenza,inj,Quad PF,6+ Mos 05/15/2016, 04/07/2017, 01/26/2019, 02/21/2021   Influenza-Unspecified 03/19/2018, 04/01/2020   Moderna Covid-19 Vaccine Bivalent Booster 19yr & up 05/17/2021   PFIZER(Purple Top)SARS-COV-2 Vaccination 08/26/2019, 09/20/2019, 04/25/2020   Pneumococcal Polysaccharide-23 01/26/2019   Tdap 10/31/2011, 02/21/2021     Past Medical History:  Diagnosis Date   Allergy    Anterior femoral cutaneous neuropathy of right lower extremity    Anxiety    Asthma    Chicken pox    Depression, major    GERD (gastroesophageal reflux disease)    Hemorrhoids 11/03/2020   Hypertension    Internal hemorrhoids    rectal bleeding,  colonoscopy   Migraine    OSA on CPAP 04/2012   AHI23   RAD (reactive airway disease)    ? 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  Allergies  Allergen Reactions   Aspirin     hives   Past Surgical History:  Procedure Laterality Date   EYE SURGERY     lazy eye   Family History  Problem Relation Age of Onset   Diabetes Mother    Colon polyps Mother    Diabetes Father    Early death Father        heart attack 511  Breast cancer Sister    Liver cancer Sister    Mental illness Brother    Colon cancer Neg Hx    Esophageal cancer Neg Hx    Inflammatory bowel disease Neg Hx    Pancreatic cancer Neg Hx    Rectal cancer Neg Hx    Stomach cancer Neg Hx    Social History   Social History Narrative   Married to [employer]([place]. Has one child named [name](special needs).    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    Drinks caffeine. Takes a daily vitamin.    Wears seatbelt. Smoke detector in the home.    Exercises routinely.    Feels safe in his relationships     Allergies as of 01/09/2022       Reactions   Aspirin    hives        Medication List        Accurate as of January 09, 2022 11:59 PM. If you have any questions, ask your nurse or doctor.          STOP taking these medications    Victoza 18 MG/3ML Sopn Generic drug: liraglutide Stopped by: RHoward Pouch DO       TAKE these medications    Accu-Chek Guide test strip Generic drug: glucose blood USE TO CHECK FASTING BLOOD SUGAR DAILY   Albuterol Sulfate 108 (90 Base) MCG/ACT Aepb Commonly known as: ProAir RespiClick Inhale 2 puffs into the lungs every 4 (four) hours as needed.   atorvastatin 20 MG tablet Commonly known as: LIPITOR Take 1 tablet (20 mg) by mouth daily.   dapagliflozin propanediol 10 MG Tabs tablet Commonly known as: Farxiga Take 1 tablet (10 mg  total) by mouth daily before breakfast. Started by: RHoward Pouch  DO   Dexcom G6 Receiver Devi Monitor blood glucose TID   Dexcom G6 Sensor Misc Monitor blood glucose TID   Dexcom G6 Transmitter Misc Monitor blood glucose TID   DULoxetine 60 MG capsule Commonly known as: Cymbalta Take 1 capsule (60 mg total) by mouth 2 (two) times daily. What changed: when to take this Changed by: Howard Pouch, DO   hydrochlorothiazide 25 MG tablet Commonly known as: HYDRODIURIL Take 1 tablet (25 mg total) by mouth daily.   Insulin Pen Needle 32G X 6 MM Misc For once daily use with Victoza pen.   lisinopril 20 MG tablet Commonly known as: ZESTRIL Take 0.5 tablets (10 mg total) by mouth daily.   loratadine 10 MG tablet Commonly known as: CLARITIN Take 10 mg by mouth daily.   meloxicam 15 MG tablet Commonly known as: MOBIC Take 1 tablet (15 mg total) by mouth daily.   metFORMIN 1000 MG tablet Commonly known as: GLUCOPHAGE Take 1 tablet (1,000 mg total) by mouth 2 (two) times daily with a meal.   pregabalin 150 MG capsule Commonly known as: LYRICA Take 1 capsule (150 mg total) by mouth 2 (two) times daily.       All past medical history, surgical history, allergies, family history, immunizations andmedications were updated in the EMR today and reviewed under the history and medication portions of their EMR.       ROS: 14 pt review of systems performed and negative (unless mentioned in an HPI)  Objective: BP 119/82   Pulse 70   Temp 98.4 F (36.9 C) (Oral)   Ht 6' 1.5" (1.867 m)   Wt (!) 383 lb (173.7 kg)   SpO2 98%   BMI 49.85 kg/m  Physical Exam Vitals and nursing note reviewed.  Constitutional:      General: He is not in acute distress.    Appearance: Normal appearance. He is obese. He is not ill-appearing, toxic-appearing or diaphoretic.  HENT:     Head: Normocephalic and atraumatic.     Mouth/Throat:     Mouth: Mucous membranes are moist.  Eyes:     General:  No scleral icterus.       Right eye: No discharge.        Left eye: No discharge.     Extraocular Movements: Extraocular movements intact.     Pupils: Pupils are equal, round, and reactive to light.  Cardiovascular:     Rate and Rhythm: Normal rate and regular rhythm.  Pulmonary:     Effort: Pulmonary effort is normal. No respiratory distress.     Breath sounds: Normal breath sounds. No wheezing, rhonchi or rales.  Musculoskeletal:     Right lower leg: No edema.     Left lower leg: No edema.  Skin:    General: Skin is warm and dry.     Coloration: Skin is not jaundiced or pale.     Findings: No rash.  Neurological:     Mental Status: He is alert and oriented to person, place, and time. Mental status is at baseline.  Psychiatric:        Mood and Affect: Mood normal.        Behavior: Behavior normal.        Thought Content: Thought content normal.        Judgment: Judgment normal.    Diabetic Foot Exam - Simple   No data filed      No results found for this or any previous visit (from the  past 48 hour(s)).   Assessment/plan: Christopher Ingram is a 46 y.o. male present for CPE/CMC Lumbar radiculopathy/back pain/obesity -Stable - Continue Lyrica 150 mg twice daily. - Increase Cymbalta 60 mg to twice daily - Continue Mobic 15 mg daily with food. - Xray completed 11/2018.Mild multilevel degenerative disc disease with small anterior osteophytes. No fracture or traumatic malalignment. -Accommodation form for employer completed in the past -Trial of Elavil; was not helpful.    Anxiety with depression Stable Increase Cymbalta to 60 mg to twice daily Continue Lyrica 150 mg twice daily (dx: Diabetic neuropathy)  Essential hypertension, benign/elevated Tg/morbid obesity Stable -  goal of 130/80.  Continue HCTZ 25 mg QD Continue lisinopril 10 mg qd. - low salt diet.  Type 2 diabetes mellitus with hyperlipidemia (HCC) A1c climbing again now that he has been out of Victoza for a  month. A1c 5.9> 6.8 - Continue the diet and weight loss - Continue metformin to 1000 mg twice daily. Start Farxiga -Continue atorvastatin 20 mg daily - Janumet, Ozempic and Victoza became too expensive.   PNA series: pneumovax completed  Eye exam up-to-date 08/2021 Foot exam up-to-date 10/17/2021  Return in about 12 weeks (around 04/03/2022) for Routine chronic condition follow-up.  Orders Placed This Encounter  Procedures   POCT HgB A1C    Meds ordered this encounter  Medications   metFORMIN (GLUCOPHAGE) 1000 MG tablet    Sig: Take 1 tablet (1,000 mg total) by mouth 2 (two) times daily with a meal.    Dispense:  180 tablet    Refill:  1   dapagliflozin propanediol (FARXIGA) 10 MG TABS tablet    Sig: Take 1 tablet (10 mg total) by mouth daily before breakfast.    Dispense:  30 tablet    Refill:  5   DULoxetine (CYMBALTA) 60 MG capsule    Sig: Take 1 capsule (60 mg total) by mouth 2 (two) times daily.    Dispense:  180 capsule    Refill:  1   Referral Orders  No referral(s) requested today     Note is dictated utilizing voice recognition software. Although note has been proof read prior to signing, occasional typographical errors still can be missed. If any questions arise, please do not hesitate to call for verification.  Electronically signed by: Howard Pouch, DO [place]

## 2022-01-24 ENCOUNTER — Other Ambulatory Visit: Payer: Self-pay

## 2022-01-24 MED ORDER — MELOXICAM 15 MG PO TABS: 15.0000 mg | ORAL_TABLET | Freq: Every day | ORAL | 0 refills | Status: DC

## 2022-01-30 ENCOUNTER — Encounter: Payer: Self-pay | Admitting: Family Medicine

## 2022-01-30 ENCOUNTER — Ambulatory Visit (INDEPENDENT_AMBULATORY_CARE_PROVIDER_SITE_OTHER): Payer: BC Managed Care – PPO | Admitting: Family Medicine

## 2022-01-30 ENCOUNTER — Other Ambulatory Visit: Payer: Self-pay

## 2022-01-30 VITALS — BP 122/83 | HR 90 | Temp 97.2°F | Ht 73.5 in | Wt 379.0 lb

## 2022-01-30 DIAGNOSIS — R051 Acute cough: Secondary | ICD-10-CM

## 2022-01-30 DIAGNOSIS — J988 Other specified respiratory disorders: Secondary | ICD-10-CM

## 2022-01-30 LAB — POC COVID19 BINAXNOW: SARS Coronavirus 2 Ag: NEGATIVE

## 2022-01-30 MED ORDER — DOXYCYCLINE HYCLATE 100 MG PO CAPS: 100.0000 mg | ORAL_CAPSULE | Freq: Two times a day (BID) | ORAL | 0 refills | Status: AC

## 2022-01-30 MED ORDER — DOXYCYCLINE HYCLATE 100 MG PO CAPS: 100.0000 mg | ORAL_CAPSULE | Freq: Two times a day (BID) | ORAL | 0 refills | Status: DC

## 2022-01-30 NOTE — Progress Notes (Signed)
Christopher Ingram , 04/14/1976, 46 y.o., male MRN: [id number] Patient Care Team    Relationship Specialty Notifications Start End  [name], DO PCP - General Family Medicine  01/03/16   [name], MD Consulting Physician Pulmonary Disease  10/11/20   [name], Christopher Nab., MD Consulting Physician Gastroenterology  02/21/21     Chief Complaint  Patient presents with   Cough    Pt c/o cough, nasal congestion, HA, sinus pressure, nasal drainage exposure with covid at home sx started 4 days ago     Subjective: Pt presents for an OV with complaints of cough, nasal congestion, sinus headache, sinus pressure, nasal drainage of 4 days duration.  His wife just recently is getting over COVID illness.  Patient denies fever or chills.  He is tolerating p.o.  He is not taking any antihistamines or over-the-counter products at this time.    07/05/2021    8:46 AM 02/21/2021    9:57 AM 11/22/2020    2:52 PM 10/11/2020    8:53 AM 07/14/2020    3:16 PM  Depression screen PHQ 2/9  Decreased Interest '1 1 1 '$ 0 2  Down, Depressed, Hopeless 0 0 2 0 3  PHQ - 2 Score '1 1 3 '$ 0 5  Altered sleeping 2 0 '1 1 3  '$ Tired, decreased energy '2 1 2 1 2  '$ Change in appetite 0 '2 3 2 3  '$ Feeling bad or failure about yourself  0 0 0 0 1  Trouble concentrating 0 0 1 0 0  Moving slowly or fidgety/restless 0 0 0 0 0  Suicidal thoughts 0 0 0 0 0  PHQ-9 Score '5 4 10 4 14    '$ Allergies  Allergen Reactions   Aspirin     hives   Social History   Social History Narrative   Married to [employer] Christopher). Has one child named [name] (special needs).    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    Denies tobacco, drugs or etoh.    Drinks caffeine. Takes a daily vitamin.    Wears seatbelt. Smoke detector in the home.    Exercises routinely.    Feels safe in his relationships    Past Medical History:  Diagnosis Date   Allergy    Anterior femoral cutaneous neuropathy of right lower extremity    Anxiety    Asthma     Chicken pox    Depression, major    GERD (gastroesophageal reflux disease)    Hemorrhoids 11/03/2020   Hypertension    Internal hemorrhoids    rectal bleeding, colonoscopy   Migraine    OSA on CPAP 04/2012   AHI23   RAD (reactive airway disease)    ? 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[employer]   Past Surgical History:  Procedure Laterality Date   EYE SURGERY     lazy eye   Family History  Problem Relation Age of Onset   Diabetes Mother    Colon polyps Mother    Diabetes Father    Early death Father        heart attack 18   Breast cancer Sister    Liver cancer Sister    Mental illness Brother    Colon cancer Neg Hx    Esophageal cancer Neg Hx    Inflammatory bowel disease Neg Hx  Pancreatic cancer Neg Hx    Rectal cancer Neg Hx    Stomach cancer Neg Hx    Allergies as of 01/30/2022       Reactions   Aspirin    hives        Medication List        Accurate as of January 30, 2022  5:14 PM. If you have any questions, ask your nurse or doctor.          Accu-Chek Guide test strip Generic drug: glucose blood USE TO CHECK FASTING BLOOD SUGAR DAILY   Albuterol Sulfate 108 (90 Base) MCG/ACT Aepb Commonly known as: ProAir RespiClick Inhale 2 puffs into the lungs every 4 (four) hours as needed.   atorvastatin 20 MG tablet Commonly known as: LIPITOR Take 1 tablet (20 mg) by mouth daily.   dapagliflozin propanediol 10 MG Tabs tablet Commonly known as: Farxiga Take 1 tablet (10 mg total) by mouth daily before breakfast.   Dexcom G6 Receiver Devi Monitor blood glucose TID   Dexcom G6 Sensor Misc Monitor blood glucose TID   Dexcom G6 Transmitter Misc Monitor blood glucose TID   doxycycline 100 MG capsule Commonly known as: VIBRAMYCIN Take 1 capsule (100 mg total) by mouth 2 (two) times daily for 10 days. Started by: Christopher Ingram, CMA   DULoxetine 60 MG  capsule Commonly known as: Cymbalta Take 1 capsule (60 mg total) by mouth 2 (two) times daily.   hydrochlorothiazide 25 MG tablet Commonly known as: HYDRODIURIL Take 1 tablet (25 mg total) by mouth daily.   Insulin Pen Needle 32G X 6 MM Misc For once daily use with Victoza pen.   lisinopril 20 MG tablet Commonly known as: ZESTRIL Take 0.5 tablets (10 mg total) by mouth daily.   loratadine 10 MG tablet Commonly known as: CLARITIN Take 10 mg by mouth daily.   meloxicam 15 MG tablet Commonly known as: MOBIC Take 1 tablet (15 mg total) by mouth daily.   metFORMIN 1000 MG tablet Commonly known as: GLUCOPHAGE Take 1 tablet (1,000 mg total) by mouth 2 (two) times daily with a meal.   pregabalin 150 MG capsule Commonly known as: LYRICA Take 1 capsule (150 mg total) by mouth 2 (two) times daily.        All past medical history, surgical history, allergies, family history, immunizations andmedications were updated in the EMR today and reviewed under the history and medication portions of their EMR.     ROS Negative, with the exception of above mentioned in HPI   Objective:  BP 122/83   Pulse 90   Temp (!) 97.2 F (36.2 C) (Oral)   Ht 6' 1.5" (1.867 m)   Wt (!) 379 lb (171.9 kg)   SpO2 96%   BMI 49.33 kg/m  Body mass index is 49.33 kg/m. Physical Exam Vitals and nursing note reviewed.  Constitutional:      General: He is not in acute distress.    Appearance: Normal appearance. He is not ill-appearing, toxic-appearing or diaphoretic.  HENT:     Head: Normocephalic and atraumatic.     Right Ear: Tympanic membrane and ear canal normal.     Left Ear: Tympanic membrane and ear canal normal.     Nose: Congestion and rhinorrhea present.     Mouth/Throat:     Mouth: Mucous membranes are moist.     Pharynx: Posterior oropharyngeal erythema present. No oropharyngeal exudate.  Eyes:     General: No scleral icterus.  Right eye: No discharge.        Left eye: No  discharge.     Extraocular Movements: Extraocular movements intact.     Pupils: Pupils are equal, round, and reactive to light.  Cardiovascular:     Rate and Rhythm: Normal rate and regular rhythm.     Heart sounds: No murmur heard. Pulmonary:     Effort: Pulmonary effort is normal. No respiratory distress.     Breath sounds: Normal breath sounds. No wheezing, rhonchi or rales.  Musculoskeletal:     Cervical back: Neck supple.     Right lower leg: No edema.     Left lower leg: No edema.  Lymphadenopathy:     Cervical: No cervical adenopathy.  Skin:    General: Skin is warm and dry.     Coloration: Skin is not jaundiced or pale.     Findings: No rash.  Neurological:     Mental Status: He is alert and oriented to person, place, and time. Mental status is at baseline.  Psychiatric:        Mood and Affect: Mood normal.        Behavior: Behavior normal.        Thought Content: Thought content normal.        Judgment: Judgment normal.      No results found. No results found. Results for orders placed or performed in visit on 01/30/22 (from the past 24 hour(s))  POC COVID-19 BinaxNow     Status: None   Collection Time: 01/30/22  1:45 PM  Result Value Ref Range   SARS Coronavirus 2 Ag Negative Negative    Assessment/Plan: Elzie Knisley is a 46 y.o. male present for OV for  Respiratory infection/acute cough - POC COVID-19 BinaxNow Rest, hydrate.  +/- flonase, mucinex (DM if cough), nettie pot or nasal saline.  Doxycycline twice daily prescription printed for him to start only if symptoms are worsening/not improving over the weekend. If cough present it can last up to 6-8 weeks.  F/U 2 weeks if not improved.   Reviewed expectations re: course of current medical issues. Discussed self-management of symptoms. Outlined signs and symptoms indicating need for more acute intervention. Patient verbalized understanding and all questions were answered. Patient received an After-Visit  Summary.    Orders Placed This Encounter  Procedures   POC COVID-19 BinaxNow   Meds ordered this encounter  Medications   DISCONTD: doxycycline (VIBRAMYCIN) 100 MG capsule    Sig: Take 1 capsule (100 mg total) by mouth 2 (two) times daily for 10 days.    Dispense:  20 capsule    Refill:  0   Referral Orders  No referral(s) requested today     Note is dictated utilizing voice recognition software. Although note has been proof read prior to signing, occasional typographical errors still can be missed. If any questions arise, please do not hesitate to call for verification.   electronically signed by:  Howard Pouch, DO  [place]

## 2022-02-26 ENCOUNTER — Other Ambulatory Visit: Payer: Self-pay

## 2022-02-26 MED ORDER — MELOXICAM 15 MG PO TABS: 15.0000 mg | ORAL_TABLET | Freq: Every day | ORAL | 0 refills | Status: DC

## 2022-04-02 ENCOUNTER — Other Ambulatory Visit: Payer: Self-pay

## 2022-04-03 ENCOUNTER — Ambulatory Visit (INDEPENDENT_AMBULATORY_CARE_PROVIDER_SITE_OTHER): Payer: BC Managed Care – PPO | Admitting: Family Medicine

## 2022-04-03 VITALS — BP 126/82 | HR 79 | Temp 98.6°F | Wt 371.2 lb

## 2022-04-03 DIAGNOSIS — G4733 Obstructive sleep apnea (adult) (pediatric): Secondary | ICD-10-CM

## 2022-04-03 DIAGNOSIS — E785 Hyperlipidemia, unspecified: Secondary | ICD-10-CM

## 2022-04-03 DIAGNOSIS — I10 Essential (primary) hypertension: Secondary | ICD-10-CM

## 2022-04-03 DIAGNOSIS — F418 Other specified anxiety disorders: Secondary | ICD-10-CM | POA: Diagnosis not present

## 2022-04-03 DIAGNOSIS — E1169 Type 2 diabetes mellitus with other specified complication: Secondary | ICD-10-CM | POA: Diagnosis not present

## 2022-04-03 DIAGNOSIS — Z6841 Body Mass Index (BMI) 40.0 and over, adult: Secondary | ICD-10-CM

## 2022-04-03 DIAGNOSIS — Z23 Encounter for immunization: Secondary | ICD-10-CM

## 2022-04-03 DIAGNOSIS — E1149 Type 2 diabetes mellitus with other diabetic neurological complication: Secondary | ICD-10-CM

## 2022-04-03 DIAGNOSIS — M5416 Radiculopathy, lumbar region: Secondary | ICD-10-CM

## 2022-04-03 LAB — COMPREHENSIVE METABOLIC PANEL
ALT: 20 U/L (ref 0–53)
AST: 14 U/L (ref 0–37)
Albumin: 4.5 g/dL (ref 3.5–5.2)
Alkaline Phosphatase: 57 U/L (ref 39–117)
BUN: 18 mg/dL (ref 6–23)
CO2: 30 mEq/L (ref 19–32)
Calcium: 10 mg/dL (ref 8.4–10.5)
Chloride: 100 mEq/L (ref 96–112)
Creatinine, Ser: 0.95 mg/dL (ref 0.40–1.50)
GFR: 94.8 mL/min (ref >60.00)
Glucose, Bld: 97 mg/dL (ref 70–99)
Potassium: 4 mEq/L (ref 3.5–5.1)
Sodium: 139 mEq/L (ref 135–145)
Total Bilirubin: 0.5 mg/dL (ref 0.2–1.2)
Total Protein: 7.6 g/dL (ref 6.0–8.3)

## 2022-04-03 LAB — LIPID PANEL
Cholesterol: 152 mg/dL (ref 0–200)
HDL: 52.7 mg/dL (ref >39.00)
LDL Cholesterol: 64 mg/dL (ref 0–99)
NonHDL: 98.87
Total CHOL/HDL Ratio: 3
Triglycerides: 176 mg/dL — ABNORMAL HIGH (ref 0.0–149.0)
VLDL: 35.2 mg/dL (ref 0.0–40.0)

## 2022-04-03 LAB — CBC
HCT: 42.2 % (ref 39.0–52.0)
Hemoglobin: 14 g/dL (ref 13.0–17.0)
MCHC: 33.2 g/dL (ref 30.0–36.0)
MCV: 85.5 fl (ref 78.0–100.0)
Platelets: 331 10*3/uL (ref 150.0–400.0)
RBC: 4.94 Mil/uL (ref 4.22–5.81)
RDW: 15 % (ref 11.5–15.5)
WBC: 8.4 10*3/uL (ref 4.0–10.5)

## 2022-04-03 LAB — HEMOGLOBIN A1C: Hgb A1c MFr Bld: 7.1 % — ABNORMAL HIGH (ref 4.6–6.5)

## 2022-04-03 LAB — VITAMIN D 25 HYDROXY (VIT D DEFICIENCY, FRACTURES): VITD: 12.2 ng/mL — ABNORMAL LOW (ref 30.00–100.00)

## 2022-04-03 LAB — TSH: TSH: 0.82 u[IU]/mL (ref 0.35–5.50)

## 2022-04-03 MED ORDER — MELOXICAM 15 MG PO TABS: 15.0000 mg | ORAL_TABLET | Freq: Every day | ORAL | 1 refills | Status: DC

## 2022-04-03 MED ORDER — ATORVASTATIN CALCIUM 20 MG PO TABS: ORAL_TABLET | ORAL | 3 refills | Status: DC

## 2022-04-03 MED ORDER — METFORMIN HCL 1000 MG PO TABS: 1000.0000 mg | ORAL_TABLET | Freq: Two times a day (BID) | ORAL | 1 refills | Status: DC

## 2022-04-03 MED ORDER — PREGABALIN 150 MG PO CAPS: 150.0000 mg | ORAL_CAPSULE | Freq: Two times a day (BID) | ORAL | 5 refills | Status: DC

## 2022-04-03 MED ORDER — DAPAGLIFLOZIN PROPANEDIOL 10 MG PO TABS: 10.0000 mg | ORAL_TABLET | Freq: Every day | ORAL | 5 refills | Status: DC

## 2022-04-03 MED ORDER — LISINOPRIL 20 MG PO TABS: 10.0000 mg | ORAL_TABLET | Freq: Every day | ORAL | 1 refills | Status: DC

## 2022-04-03 MED ORDER — HYDROCHLOROTHIAZIDE 25 MG PO TABS: 25.0000 mg | ORAL_TABLET | Freq: Every day | ORAL | 1 refills | Status: DC

## 2022-04-03 MED ORDER — DULOXETINE HCL 60 MG PO CPEP: 60.0000 mg | ORAL_CAPSULE | Freq: Two times a day (BID) | ORAL | 1 refills | Status: DC

## 2022-04-03 NOTE — Progress Notes (Addendum)
Patient ID: Christopher Ingram, male  DOB: 1976-05-05, 46 y.o.   MRN: [id number] Patient Care Team    Relationship Specialty Notifications Start End  Christopher Ingram PCP - General Family Medicine  01/03/16   Christopher Ingram Consulting Physician Pulmonary Disease  10/11/20   Christopher Ingram Consulting Physician Gastroenterology  02/21/21     Chief Complaint  Patient presents with   Diabetes    [facility]    Subjective: Christopher Ingram is a 46 y.o. male present for cmc  All past medical history, surgical history, allergies, family history, immunizations, medications and social history were updated in the electronic medical record today. All recent labs, ED visits and hospitalizations within the last year were reviewed.  Depression/anxiety/neuropathy-lumbar pain: Patient reports compliance with cymbalta 60 mg daily and Lyrica 150 mg twice daily.  He feels this combination is very well for his depression, anxiety and low back pain/diabetic neuropathy.  He had noticed he had become a little bit more irritable last visit and we increased his cymbalta dose to BID. Today he states the increase in Cymbalta seems to be helping. Tired meds in the past: zoloft. Prozac  Hypertension/morbid obesity/elevated tg : Pt reports compliance  with his HCTZ 25 mg and lisinopril 10 mg. He is also eating a low salt diet.. Patient denies chest pain, shortness of breath, dizziness or lower extremity edema.  He is tolerating statin.  Diet: low sodium, low carb Exercise: routinely RF: HTN, HLD, DM, obesity, FH HD (father MI 67)  Diabetes:  Diagnosed 10/2018 with an a1c of 9.5.  Pt did well with janumet-- unfortunately it is now too expensive. Patient did well with Ozempic and Victoza, again both became too expensive as formulary changed Patient denies dizziness, hyperglycemic or hypoglycemic events. Patient denies numbness, tingling in the extremities or nonhealing wounds of feet.  He has chronic neuropathy in  which she states the Lyrica is working well for him. Patient reports compliance  with metformin 1000 mg daily and Farxiga 10 mg qd.       07/05/2021    8:46 AM 02/21/2021    9:57 AM 11/22/2020    2:52 PM 10/11/2020    8:53 AM 07/14/2020    3:16 PM  Depression screen PHQ 2/9  Decreased Interest _0 0 2  Down, Depressed, Hopeless 0 0 2 0 3  PHQ - 2 Score _1 0 5  Altered sleeping 2 0 _2 Tired, decreased energy _3 Change in appetite 0 _4 Feeling bad or failure about yourself  0 0 0 0 1  Trouble concentrating 0 0 1 0 0  Moving slowly or fidgety/restless 0 0 0 0 0  Suicidal thoughts 0 0 0 0 0  PHQ-9 Score _5 07/05/2021    8:46 AM 02/21/2021    9:58 AM 11/22/2020    2:52 PM 10/11/2020    8:54 AM  GAD 7 : Generalized Anxiety Score  Nervous, Anxious, on Edge _6 0  Control/stop worrying 0 0 1 0  Worry too much - different things 1 0 2 0  Trouble relaxing 1 0 2 1  Restless 0 0 1 0  Easily annoyed or irritable _7 Afraid - awful might happen 0 0 0 0  Total GAD 7 Score _8 2  01/24/2016    3:43 PM 01/03/2016    1:51 PM  [place] in the past year? No No   Immunization History  Administered Date(s) Administered   Influenza,inj,Quad PF,6+ Mos 05/15/2016, 04/07/2017, 01/26/2019, 02/21/2021, 03/05/2022   Influenza-Unspecified 03/19/2018, 04/01/2020   Moderna Covid-19 Vaccine Bivalent Booster 38yr & up 05/17/2021   Moderna SARS-COV2 Booster Vaccination 03/05/2022   PFIZER(Purple Top)SARS-COV-2 Vaccination 08/26/2019, 09/20/2019, 04/25/2020   Pneumococcal Polysaccharide-23 01/26/2019   Tdap 10/31/2011, 02/21/2021     Past Medical History:  Diagnosis Date   Allergy    Anterior femoral cutaneous neuropathy of right lower extremity    Anxiety    Asthma    Chicken pox    Depression, major    GERD (gastroesophageal reflux disease)    Hemorrhoids 11/03/2020   Hypertension    Internal hemorrhoids    rectal bleeding,  colonoscopy   Migraine    OSA on CPAP 04/2012   AHI23   RAD (reactive airway disease)    ? 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  Allergies  Allergen Reactions   Aspirin     hives   Past Surgical History:  Procedure Laterality Date   EYE SURGERY     lazy eye   Family History  Problem Relation Age of Onset   Diabetes Mother    Colon polyps Mother    Diabetes Father    Early death Father        heart attack 568  Breast cancer Sister    Liver cancer Sister    Mental illness Brother    Colon cancer Neg Hx    Esophageal cancer Neg Hx    Inflammatory bowel disease Neg Hx    Pancreatic cancer Neg Hx    Rectal cancer Neg Hx    Stomach cancer Neg Hx    Social History   Social History Narrative   Married to [employer]([place]. Has one child named Christopher Ingram(special needs).    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    Drinks caffeine. Takes a daily vitamin.    Wears seatbelt. Smoke detector in the home.    Exercises routinely.    Feels safe in his relationships     Allergies as of 04/03/2022       Reactions   Aspirin    hives        Medication List        Accurate as of April 03, 2022  5:13 PM. If you have any questions, ask your nurse or doctor.          STOP taking these medications    Insulin Pen Needle 32G X 6 MM Misc Stopped by: RHoward Pouch Ingram       TAKE these medications    Accu-Chek Guide test strip Generic drug: glucose blood USE TO CHECK FASTING BLOOD SUGAR DAILY   Albuterol Sulfate 108 (90 Base) MCG/ACT Aepb Commonly known as: ProAir RespiClick Inhale 2 puffs into the lungs every 4 (four) hours as needed.   atorvastatin 20 MG tablet Commonly known as: LIPITOR Take 1 tablet (20 mg) by mouth daily.   dapagliflozin propanediol 10 MG Tabs tablet Commonly known as: Farxiga Take 1 tablet (10 mg total) by  mouth daily before breakfast.   Dexcom G6 Receiver Devi Monitor blood glucose TID  Dexcom G6 Sensor Misc Monitor blood glucose TID   Dexcom G6 Transmitter Misc Monitor blood glucose TID   DULoxetine 60 MG capsule Commonly known as: Cymbalta Take 1 capsule (60 mg total) by mouth 2 (two) times daily.   hydrochlorothiazide 25 MG tablet Commonly known as: HYDRODIURIL Take 1 tablet (25 mg total) by mouth daily.   lisinopril 20 MG tablet Commonly known as: ZESTRIL Take 0.5 tablets (10 mg total) by mouth daily.   loratadine 10 MG tablet Commonly known as: CLARITIN Take 10 mg by mouth daily.   meloxicam 15 MG tablet Commonly known as: MOBIC Take 1 tablet (15 mg total) by mouth daily.   metFORMIN 1000 MG tablet Commonly known as: GLUCOPHAGE Take 1 tablet (1,000 mg total) by mouth 2 (two) times daily with a meal.   pregabalin 150 MG capsule Commonly known as: LYRICA Take 1 capsule (150 mg total) by mouth 2 (two) times daily.       All past medical history, surgical history, allergies, family history, immunizations andmedications were updated in the EMR today and reviewed under the history and medication portions of their EMR.       ROS: 14 pt review of systems performed and negative (unless mentioned in an HPI)  Objective: BP 126/82   Pulse 79   Temp 98.6 F (37 C)   Wt (!) 371 lb 3.2 oz (168.4 kg)   SpO2 96%   BMI 48.31 kg/m  Physical Exam Vitals and nursing note reviewed.  Constitutional:      General: He is not in acute distress.    Appearance: Normal appearance. He is not ill-appearing, toxic-appearing or diaphoretic.  HENT:     Head: Normocephalic and atraumatic.  Eyes:     General: No scleral icterus.       Right eye: No discharge.        Left eye: No discharge.     Extraocular Movements: Extraocular movements intact.     Pupils: Pupils are equal, round, and reactive to light.  Cardiovascular:     Rate and Rhythm: Normal rate and regular rhythm.   Pulmonary:     Effort: Pulmonary effort is normal. No respiratory distress.     Breath sounds: Normal breath sounds. No wheezing, rhonchi or rales.  Musculoskeletal:     Cervical back: Neck supple.     Right lower leg: No edema.     Left lower leg: No edema.  Lymphadenopathy:     Cervical: No cervical adenopathy.  Skin:    General: Skin is warm and dry.     Coloration: Skin is not jaundiced or pale.     Findings: No rash.  Neurological:     Mental Status: He is alert and oriented to person, place, and time. Mental status is at baseline.  Psychiatric:        Mood and Affect: Mood normal.        Behavior: Behavior normal.        Thought Content: Thought content normal.        Judgment: Judgment normal.    Diabetic Foot Exam - Simple   Simple Foot Form Diabetic Foot exam was performed with the following findings: Yes 04/03/2022  1:12 PM  Visual Inspection No deformities, no ulcerations, no other skin breakdown bilaterally: Yes Sensation Testing See comments: Yes Pulse Check Posterior Tibialis and Dorsalis pulse intact bilaterally: Yes Comments Chronic neuropathy     Results for orders placed or performed in visit on 04/03/22 (from the past  48 hour(s))  Hemoglobin A1c     Status: Abnormal   Collection Time: 04/03/22  1:07 PM  Result Value Ref Range   Hgb A1c MFr Bld 7.1 (H) 4.6 - 6.5 %    Comment: Glycemic Control Guidelines for People with Diabetes:Non Diabetic:  <6%Goal of Therapy: <7%Additional Action Suggested:  >8%   Comp Met (CMET)     Status: None   Collection Time: 04/03/22  1:07 PM  Result Value Ref Range   Sodium 139 135 - 145 mEq/L   Potassium 4.0 3.5 - 5.1 mEq/L   Chloride 100 96 - 112 mEq/L   CO2 30 19 - 32 mEq/L   Glucose, Bld 97 70 - 99 mg/dL   BUN 18 6 - 23 mg/dL   Creatinine, Ser 0.95 0.40 - 1.50 mg/dL   Total Bilirubin 0.5 0.2 - 1.2 mg/dL   Alkaline Phosphatase 57 39 - 117 U/L   AST 14 0 - 37 U/L   ALT 20 0 - 53 U/L   Total Protein 7.6 6.0 - 8.3  g/dL   Albumin 4.5 3.5 - 5.2 g/dL   GFR 94.80 >60.00 mL/min    Comment: Calculated using the CKD-EPI Creatinine Equation (2021)   Calcium 10.0 8.4 - 10.5 mg/dL  CBC     Status: None   Collection Time: 04/03/22  1:07 PM  Result Value Ref Range   WBC 8.4 4.0 - 10.5 K/uL   RBC 4.94 4.22 - 5.81 Mil/uL   Platelets 331.0 150.0 - 400.0 K/uL   Hemoglobin 14.0 13.0 - 17.0 g/dL   HCT 42.2 39.0 - 52.0 %   MCV 85.5 78.0 - 100.0 fl   MCHC 33.2 30.0 - 36.0 g/dL   RDW 15.0 11.5 - 15.5 %  TSH     Status: None   Collection Time: 04/03/22  1:07 PM  Result Value Ref Range   TSH 0.82 0.35 - 5.50 uIU/mL  Vitamin D (25 hydroxy)     Status: Abnormal   Collection Time: 04/03/22  1:07 PM  Result Value Ref Range   VITD 12.20 (L) 30.00 - 100.00 ng/mL  Lipid panel     Status: Abnormal   Collection Time: 04/03/22  1:07 PM  Result Value Ref Range   Cholesterol 152 0 - 200 mg/dL    Comment: ATP III Classification       Desirable:  < 200 mg/dL               Borderline High:  200 - 239 mg/dL          High:  > = 240 mg/dL   Triglycerides 176.0 (H) 0.0 - 149.0 mg/dL    Comment: Normal:  <150 mg/dLBorderline High:  150 - 199 mg/dL   HDL 52.70 >39.00 mg/dL   VLDL 35.2 0.0 - 40.0 mg/dL   LDL Cholesterol 64 0 - 99 mg/dL   Total CHOL/HDL Ratio 3     Comment:                Men          Women1/2 Average Risk     3.4          3.3Average Risk          5.0          4.42X Average Risk          9.6          7.13X Average Risk  15.0          11.0                       NonHDL 98.87     Comment: NOTE:  Non-HDL goal should be 30 mg/dL higher than patient's LDL goal (i.e. LDL goal of < 70 mg/dL, would have non-HDL goal of < 100 mg/dL)    Assessment/plan: Christopher Ingram is a 46 y.o. male present for CPE/CMC Lumbar radiculopathy/back pain/obesity Stable - Continue  Lyrica 150 mg twice daily. - continue  Cymbalta 60 mg to twice daily - Continue Mobic 15 mg daily with food. - Xray completed 11/2018.Mild multilevel  degenerative disc disease with small anterior osteophytes. No fracture or traumatic malalignment. -Accommodation form for employer completed in the past -Trial of Elavil; was not helpful.    Anxiety with depression Stable Continue Cymbalta to 60 mg to twice daily Continue  Lyrica 150 mg twice daily (dx: Diabetic neuropathy) - 30d script TSH collected today Essential hypertension, benign/elevated Tg/morbid obesity Stable -  goal of 130/80.  Continue HCTZ 25 mg QD Continue lisinopril 10 mg qd. - low salt diet. CBC, CMP, TSH and lipids collected today  Type 2 diabetes mellitus with hyperlipidemia (HCC) A1c climbing again now that he has been out of Victoza for a month. A1c 5.9> 6.8> collected today - Continue the diet and weight loss -Continue metformin to 1000 mg twice daily. Continue Farxiga -Continue atorvastatin 20 mg daily - Janumet, Ozempic and Victoza became too expensive.   PNA series: pneumovax completed  Eye exam up-to-date 08/2021 Foot exam up-to-date 10/17/2021  Return for Routine chronic condition follow-up 4-6 months depending upon A1c results.  Orders Placed This Encounter  Procedures   Hemoglobin A1c   Comp Met (CMET)   CBC   TSH   Vitamin D (25 hydroxy)   Lipid panel    Meds ordered this encounter  Medications   atorvastatin (LIPITOR) 20 MG tablet    Sig: Take 1 tablet (20 mg) by mouth daily.    Dispense:  90 tablet    Refill:  3   dapagliflozin propanediol (FARXIGA) 10 MG TABS tablet    Sig: Take 1 tablet (10 mg total) by mouth daily before breakfast.    Dispense:  30 tablet    Refill:  5   DULoxetine (CYMBALTA) 60 MG capsule    Sig: Take 1 capsule (60 mg total) by mouth 2 (two) times daily.    Dispense:  180 capsule    Refill:  1   hydrochlorothiazide (HYDRODIURIL) 25 MG tablet    Sig: Take 1 tablet (25 mg total) by mouth daily.    Dispense:  90 tablet    Refill:  1   lisinopril (ZESTRIL) 20 MG tablet    Sig: Take 0.5 tablets (10 mg total) by  mouth daily.    Dispense:  90 tablet    Refill:  1   meloxicam (MOBIC) 15 MG tablet    Sig: Take 1 tablet (15 mg total) by mouth daily.    Dispense:  90 tablet    Refill:  1   metFORMIN (GLUCOPHAGE) 1000 MG tablet    Sig: Take 1 tablet (1,000 mg total) by mouth 2 (two) times daily with a meal.    Dispense:  180 tablet    Refill:  1   pregabalin (LYRICA) 150 MG capsule    Sig: Take 1 capsule (150 mg total) by mouth 2 (two) times daily.  Dispense:  60 capsule    Refill:  5   Referral Orders  No referral(s) requested today     Note is dictated utilizing voice recognition software. Although note has been proof read prior to signing, occasional typographical errors still can be missed. If any questions arise, please Ingram not hesitate to call for verification.  Electronically signed by: Howard Pouch, Ingram [place]

## 2022-04-04 ENCOUNTER — Telehealth: Payer: Self-pay | Admitting: Family Medicine

## 2022-04-04 DIAGNOSIS — E559 Vitamin D deficiency, unspecified: Secondary | ICD-10-CM

## 2022-04-04 MED ORDER — VITAMIN D (ERGOCALCIFEROL) 1.25 MG (50000 UNIT) PO CAPS: 50000.0000 [IU] | ORAL_CAPSULE | ORAL | 0 refills | Status: DC

## 2022-04-04 NOTE — Telephone Encounter (Signed)
Please call patient Liver, kidney and thyroid function are normal Blood cell counts and electrolytes are normal Diabetes screening/A1c up a little to 7.1.  Continue Farxiga and metformin.  Follow diabetic diet. Cholesterol panel looks great.  Continue Lipitor.   Vitamin D levels are very low at 12.  This is extremely low and causes fatigue.  I have called in a once weekly vitamin D supplement for 12 weeks.  I also encouraged him to start an over-the-counter vitamin D3 2000 units daily.  start both now, and the over-the-counter 2000 units of vitamin D should be continued indefinitely/forever to maintain bone health.

## 2022-04-04 NOTE — Telephone Encounter (Signed)
Spoke with patient regarding results/recommendations.  

## 2022-05-15 ENCOUNTER — Other Ambulatory Visit: Payer: Self-pay | Admitting: Family Medicine

## 2022-05-15 MED ORDER — DAPAGLIFLOZIN PROPANEDIOL 10 MG PO TABS: 10.0000 mg | ORAL_TABLET | Freq: Every day | ORAL | 1 refills | Status: DC

## 2022-05-15 NOTE — Progress Notes (Signed)
Sent farxiga to [employer] in for him today

## 2022-06-12 ENCOUNTER — Ambulatory Visit
Admission: EM | Admit: 2022-06-12 | Discharge: 2022-06-12 | Disposition: A | Discharge status: Home / Self Care | Payer: BC Managed Care – PPO | Attending: Urgent Care | Admitting: Urgent Care

## 2022-06-12 ENCOUNTER — Other Ambulatory Visit: Payer: Self-pay

## 2022-06-12 DIAGNOSIS — U071 COVID-19: Secondary | ICD-10-CM | POA: Diagnosis not present

## 2022-06-12 DIAGNOSIS — E119 Type 2 diabetes mellitus without complications: Secondary | ICD-10-CM | POA: Diagnosis not present

## 2022-06-12 DIAGNOSIS — E785 Hyperlipidemia, unspecified: Secondary | ICD-10-CM | POA: Diagnosis not present

## 2022-06-12 MED ORDER — PAXLOVID (300/100) 20 X 150 MG & 10 X 100MG PO TBPK: ORAL_TABLET | ORAL | 0 refills | Status: DC

## 2022-06-12 MED ORDER — PROMETHAZINE-DM 6.25-15 MG/5ML PO SYRP: 5.0000 mL | ORAL_SOLUTION | Freq: Four times a day (QID) | ORAL | 0 refills | Status: DC | PRN

## 2022-06-12 MED ORDER — BENZONATATE 100 MG PO CAPS: 100.0000 mg | ORAL_CAPSULE | Freq: Three times a day (TID) | ORAL | 0 refills | Status: DC | PRN

## 2022-06-12 NOTE — Discharge Instructions (Addendum)
Do not take atorvastatin while taking Paxlovid. You need Paxlovid for the COVID infection.   We will manage this as a viral syndrome. For sore throat or cough try using a honey-based tea. Use 3 teaspoons of honey with juice squeezed from half lemon. Place shaved pieces of ginger into 1/2-1 cup of water and warm over stove top. Then mix the ingredients and repeat every 4 hours as needed. Please take Tylenol '500mg'$ -'650mg'$  once every 6 hours for fevers, aches and pains. Hydrate very well with at least 2 liters (64 ounces) of water. Eat light meals such as soups (chicken and noodles, chicken wild rice, vegetable).  Do not eat any foods that you are allergic to.  Start an antihistamine like Zyrtec for postnasal drainage, sinus congestion.  You can take this together with Paxlovid. Use the cough syrup as needed.

## 2022-06-12 NOTE — ED Triage Notes (Signed)
Pt c/o flu likes -tested +covid home test-NAD-steady gait

## 2022-06-12 NOTE — ED Provider Notes (Signed)
[facility]  Note:  This document was prepared using Systems analyst and may include unintentional dictation errors.  MRN: [id number] DOB: Nov 04, 1975  Subjective:   Christopher Ingram is a 47 y.o. male presenting for 3-4 day history of acute onset worsening coughing, malaise, fatigue. Has left lower chest pain from coughing. Has been using Zicam, Mucinex. No asthma. Has DM treated without insulin. Did a COVID test at home, was positive. Admits it was an expired test. Now has loss of taste and smell.   No current facility-administered medications for this encounter.  Current Outpatient Medications:    ACCU-CHEK GUIDE test strip, USE TO CHECK FASTING BLOOD SUGAR DAILY, Disp: , Rfl:    Albuterol Sulfate (PROAIR RESPICLICK) 578 (90 Base) MCG/ACT AEPB, Inhale 2 puffs into the lungs every 4 (four) hours as needed., Disp: 1 each, Rfl: 0   atorvastatin (LIPITOR) 20 MG tablet, Take 1 tablet (20 mg) by mouth daily., Disp: 90 tablet, Rfl: 3   Continuous Blood Gluc Receiver (DEXCOM G6 RECEIVER) DEVI, Monitor blood glucose TID, Disp: 1 each, Rfl: 0   Continuous Blood Gluc Sensor (DEXCOM G6 SENSOR) MISC, Monitor blood glucose TID, Disp: 3 each, Rfl: 11   Continuous Blood Gluc Transmit (DEXCOM G6 TRANSMITTER) MISC, Monitor blood glucose TID, Disp: 1 each, Rfl: 5   dapagliflozin propanediol (FARXIGA) 10 MG TABS tablet, Take 1 tablet (10 mg total) by mouth daily before breakfast., Disp: 90 tablet, Rfl: 1   DULoxetine (CYMBALTA) 60 MG capsule, Take 1 capsule (60 mg total) by mouth 2 (two) times daily., Disp: 180 capsule, Rfl: 1   hydrochlorothiazide (HYDRODIURIL) 25 MG tablet, Take 1 tablet (25 mg total) by mouth daily., Disp: 90 tablet, Rfl: 1   lisinopril (ZESTRIL) 20 MG tablet, Take 0.5 tablets (10 mg total) by mouth daily., Disp: 90 tablet, Rfl: 1   loratadine (CLARITIN) 10 MG tablet, Take 10 mg by mouth daily., Disp: , Rfl:    meloxicam (MOBIC) 15 MG tablet, Take 1  tablet (15 mg total) by mouth daily., Disp: 90 tablet, Rfl: 1   metFORMIN (GLUCOPHAGE) 1000 MG tablet, Take 1 tablet (1,000 mg total) by mouth 2 (two) times daily with a meal., Disp: 180 tablet, Rfl: 1   pregabalin (LYRICA) 150 MG capsule, Take 1 capsule (150 mg total) by mouth 2 (two) times daily., Disp: 60 capsule, Rfl: 5   Vitamin D, Ergocalciferol, (DRISDOL) 1.25 MG (50000 UNIT) CAPS capsule, Take 1 capsule (50,000 Units total) by mouth every 7 (seven) days., Disp: 12 capsule, Rfl: 0   Allergies  Allergen Reactions   Aspirin     hives    Past Medical History:  Diagnosis Date   Allergy    Anterior femoral cutaneous neuropathy of right lower extremity    Anxiety    Asthma    Chicken pox    Depression, major    GERD (gastroesophageal reflux disease)    Hemorrhoids 11/03/2020   Hypertension    Internal hemorrhoids    rectal bleeding, colonoscopy   Migraine    OSA on CPAP 04/2012   AHI23   RAD (reactive airway disease)    ? 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[facility]     Past Surgical History:  Procedure Laterality Date   EYE SURGERY     lazy eye    Family History  Problem Relation Age of Onset  Diabetes Mother    Colon polyps Mother    Diabetes Father    Early death Father        heart attack 42   Breast cancer Sister    Liver cancer Sister    Mental illness Brother    Colon cancer Neg Hx    Esophageal cancer Neg Hx    Inflammatory bowel disease Neg Hx    Pancreatic cancer Neg Hx    Rectal cancer Neg Hx    Stomach cancer Neg Hx     Social History   Tobacco Use   Smoking status: Never   Smokeless tobacco: Never  Vaping Use   Vaping Use: Never used  Substance Use Topics   Alcohol use: No   Drug use: No    ROS   Objective:   Vitals: BP 129/84 (BP Location: Right Arm)   Pulse (!) 112   Temp 98.1 F (36.7 C) (Oral)   Resp 15   SpO2 94%   Pulse oximetry at 96%  on recheck.   Wt Readings from Last 3 Encounters:  04/03/22 (!) 371 lb 3.2 oz (168.4 kg)  01/30/22 (!) 379 lb (171.9 kg)  01/09/22 (!) 383 lb (173.7 kg)   Temp Readings from Last 3 Encounters:  06/12/22 98.1 F (36.7 C) (Oral)  04/03/22 98.6 F (37 C)  01/30/22 (!) 97.2 F (36.2 C) (Oral)   BP Readings from Last 3 Encounters:  06/12/22 129/84  04/03/22 126/82  01/30/22 122/83   Pulse Readings from Last 3 Encounters:  06/12/22 (!) 112  04/03/22 79  01/30/22 90   Physical Exam Constitutional:      General: He is not in acute distress.    Appearance: Normal appearance. He is well-developed. He is not ill-appearing, toxic-appearing or diaphoretic.  HENT:     Head: Normocephalic and atraumatic.     Right Ear: External ear normal.     Left Ear: External ear normal.     Nose: Nose normal.     Mouth/Throat:     Mouth: Mucous membranes are moist.  Eyes:     General: No scleral icterus.       Right eye: No discharge.        Left eye: No discharge.     Extraocular Movements: Extraocular movements intact.  Cardiovascular:     Rate and Rhythm: Regular rhythm. Tachycardia present.     Heart sounds: Normal heart sounds. No murmur heard.    No friction rub. No gallop.  Pulmonary:     Effort: Pulmonary effort is normal. No respiratory distress.     Breath sounds: Normal breath sounds. No stridor. No wheezing, rhonchi or rales.  Neurological:     Mental Status: He is alert and oriented to person, place, and time.  Psychiatric:        Mood and Affect: Mood normal.        Behavior: Behavior normal.        Thought Content: Thought content normal.    Assessment and Plan :   PDMP not reviewed this encounter.  1. Clinical diagnosis of COVID-19   2. Type 2 diabetes mellitus treated without insulin ([place])   3. Dyslipidemia     Deferred imaging given clear cardiopulmonary exam, hemodynamically stable vital signs.  Will treat patient for clinical diagnosis of COVID-19 given his  overall symptoms, current incidence in the community.  Start Paxlovid.  Hold atorvastatin.  Confirmation test pending.  Use supportive care otherwise. Counseled patient on potential for  adverse effects with medications prescribed/recommended today, ER and return-to-clinic precautions discussed, patient verbalized understanding.    Jaynee Eagles, PA-C 06/12/22 1431

## 2022-06-13 LAB — SARS CORONAVIRUS 2 (TAT 6-24 HRS): SARS Coronavirus 2: POSITIVE — AB

## 2022-07-04 ENCOUNTER — Other Ambulatory Visit: Payer: Self-pay

## 2022-07-04 MED ORDER — DEXCOM G6 SENSOR MISC: 11 refills | Status: DC

## 2022-08-14 ENCOUNTER — Other Ambulatory Visit: Payer: Self-pay

## 2022-08-14 MED ORDER — DEXCOM G6 TRANSMITTER MISC: 5 refills | Status: DC

## 2022-09-04 ENCOUNTER — Encounter: Payer: Self-pay | Admitting: Family Medicine

## 2022-09-04 ENCOUNTER — Ambulatory Visit (INDEPENDENT_AMBULATORY_CARE_PROVIDER_SITE_OTHER): Payer: BC Managed Care – PPO | Admitting: Family Medicine

## 2022-09-04 VITALS — BP 132/87 | HR 105 | Temp 98.0°F | Wt 373.2 lb

## 2022-09-04 DIAGNOSIS — G4733 Obstructive sleep apnea (adult) (pediatric): Secondary | ICD-10-CM

## 2022-09-04 DIAGNOSIS — M5416 Radiculopathy, lumbar region: Secondary | ICD-10-CM

## 2022-09-04 DIAGNOSIS — F418 Other specified anxiety disorders: Secondary | ICD-10-CM

## 2022-09-04 DIAGNOSIS — I10 Essential (primary) hypertension: Secondary | ICD-10-CM

## 2022-09-04 DIAGNOSIS — E1169 Type 2 diabetes mellitus with other specified complication: Secondary | ICD-10-CM

## 2022-09-04 DIAGNOSIS — E1149 Type 2 diabetes mellitus with other diabetic neurological complication: Secondary | ICD-10-CM

## 2022-09-04 DIAGNOSIS — E785 Hyperlipidemia, unspecified: Secondary | ICD-10-CM | POA: Diagnosis not present

## 2022-09-04 DIAGNOSIS — Z6841 Body Mass Index (BMI) 40.0 and over, adult: Secondary | ICD-10-CM

## 2022-09-04 DIAGNOSIS — E559 Vitamin D deficiency, unspecified: Secondary | ICD-10-CM | POA: Diagnosis not present

## 2022-09-04 MED ORDER — PREGABALIN 150 MG PO CAPS: 150.0000 mg | ORAL_CAPSULE | Freq: Two times a day (BID) | ORAL | 5 refills | Status: DC

## 2022-09-04 MED ORDER — MELOXICAM 15 MG PO TABS: 15.0000 mg | ORAL_TABLET | Freq: Every day | ORAL | 1 refills | Status: DC

## 2022-09-04 MED ORDER — LISINOPRIL 20 MG PO TABS: 10.0000 mg | ORAL_TABLET | Freq: Every day | ORAL | 1 refills | Status: DC

## 2022-09-04 MED ORDER — VITAMIN D (ERGOCALCIFEROL) 1.25 MG (50000 UNIT) PO CAPS: 50000.0000 [IU] | ORAL_CAPSULE | ORAL | 0 refills | Status: DC

## 2022-09-04 MED ORDER — DULOXETINE HCL 60 MG PO CPEP: 60.0000 mg | ORAL_CAPSULE | Freq: Two times a day (BID) | ORAL | 1 refills | Status: DC

## 2022-09-04 MED ORDER — HYDROCHLOROTHIAZIDE 25 MG PO TABS: 25.0000 mg | ORAL_TABLET | Freq: Every day | ORAL | 1 refills | Status: DC

## 2022-09-04 MED ORDER — DAPAGLIFLOZIN PROPANEDIOL 10 MG PO TABS: 10.0000 mg | ORAL_TABLET | Freq: Every day | ORAL | 1 refills | Status: DC

## 2022-09-04 NOTE — Patient Instructions (Addendum)
Return in about 17 weeks (around 01/01/2023) for Routine chronic condition follow-up.        Great to see you today.  I have refilled the medication(s) we provide.   If labs were collected, we will inform you of lab results once received either by echart message or telephone call.   - echart message- for normal results that have been seen by the patient already.   - telephone call: abnormal results or if patient has not viewed results in their echart.

## 2022-09-04 NOTE — Progress Notes (Signed)
Patient ID: Christopher Ingram, male  DOB: November 22, 1975, 47 y.o.   MRN: [id number] Patient Care Team    Relationship Specialty Notifications Start End  Christopher Hillock, DO PCP - General Family Medicine  01/03/16   Christopher Mires, MD Consulting Physician Pulmonary Disease  10/11/20   Christopher, Telford Ingram., MD Consulting Physician Gastroenterology  02/21/21     Chief Complaint  Patient presents with   Diabetes    Subjective: Christopher Ingram is a 47 y.o. male present for cmc  All past medical history, surgical history, allergies, family history, immunizations, medications and social history were updated in the electronic medical record today. All recent labs, ED visits and hospitalizations within the last year were reviewed.  Depression/anxiety/neuropathy-lumbar pain: Patient reports compliance with cymbalta 60 mg daily and Lyrica 150 mg twice daily.  He feels this combination is working very well for his depression, anxiety and low back pain/diabetic neuropathy.    Tired meds in the past: zoloft. Prozac  Hypertension/morbid obesity/elevated tg : Pt reports compliance with his HCTZ 25 mg and lisinopril 10 mg. He is also eating a low salt diet.. Patient denies chest pain, shortness of breath, dizziness or lower extremity edema.    He is tolerating statin.  Diet: low sodium, low carb Exercise: routinely RF: HTN, HLD, DM, obesity, FH HD (father MI 51)  Diabetes:  Diagnosed 10/2018 with an a1c of 9.5.  Pt did well with janumet-- unfortunately it is now too expensive. Patient did well with Ozempic and Victoza, again both became too expensive as formulary changed Patient denies dizziness, hyperglycemic or hypoglycemic events. Patient denies numbness, tingling in the extremities or nonhealing wounds of feet.  He has chronic neuropathy in which she states the Lyrica is going well for him. Patient reports compliance with metformin 1000 mg daily and Farxiga 10 mg qd.       09/04/2022    1:12 PM  07/05/2021    8:46 AM 02/21/2021    9:57 AM 11/22/2020    2:52 PM 10/11/2020    8:53 AM  Depression screen PHQ 2/9  Decreased Interest 0 1 1 1  0  Down, Depressed, Hopeless 0 0 0 2 0  PHQ - 2 Score 0 1 1 3  0  Altered sleeping 0 2 0 1 1  Tired, decreased energy 0 2 1 2 1   Change in appetite 0 0 2 3 2   Feeling bad or failure about yourself  0 0 0 0 0  Trouble concentrating 0 0 0 1 0  Moving slowly or fidgety/restless 0 0 0 0 0  Suicidal thoughts 0 0 0 0 0  PHQ-9 Score 0 5 4 10 4       07/05/2021    8:46 AM 02/21/2021    9:58 AM 11/22/2020    2:52 PM 10/11/2020    8:54 AM  GAD 7 : Generalized Anxiety Score  Nervous, Anxious, on Edge 1 1 2  0  Control/stop worrying 0 0 1 0  Worry too much - different things 1 0 2 0  Trouble relaxing 1 0 2 1  Restless 0 0 1 0  Easily annoyed or irritable 2 1 2 1   Afraid - awful might happen 0 0 0 0  Total GAD 7 Score 5 2 10 2            01/24/2016    3:43 PM 01/03/2016    1:51 PM  Fall Risk   Falls in the past year? No No   Immunization  History  Administered Date(s) Administered   Influenza,inj,Quad PF,6+ Mos 05/15/2016, 04/07/2017, 01/26/2019, 02/21/2021, 03/05/2022   Influenza-Unspecified 03/19/2018, 04/01/2020   Moderna Covid-19 Vaccine Bivalent Booster 39yrs & up 05/17/2021   Moderna SARS-COV2 Booster Vaccination 03/05/2022   PFIZER(Purple Top)SARS-COV-2 Vaccination 08/26/2019, 09/20/2019, 04/25/2020   Pneumococcal Polysaccharide-23 01/26/2019   Tdap 10/31/2011, 02/21/2021     Past Medical History:  Diagnosis Date   Allergy    Anterior femoral cutaneous neuropathy of right lower extremity    Anxiety    Asthma    Chicken pox    Depression, major    GERD (gastroesophageal reflux disease)    Hemorrhoids 11/03/2020   Hypertension    Internal hemorrhoids    rectal bleeding, colonoscopy   Migraine    OSA on CPAP 04/2012   AHI23   RAD (reactive airway disease)    ? 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[facility]   Allergies  Allergen Reactions   Aspirin     hives   Past Surgical History:  Procedure Laterality Date   EYE SURGERY     lazy eye   Family History  Problem Relation Age of Onset   Diabetes Mother    Colon polyps Mother    Diabetes Father    Early death Father        heart attack 34   Breast cancer Sister    Liver cancer Sister    Mental illness Brother    Colon cancer Neg Hx    Esophageal cancer Neg Hx    Inflammatory bowel disease Neg Hx    Pancreatic cancer Neg Hx    Rectal cancer Neg Hx    Stomach cancer Neg Hx    Social History   Social History Narrative   Married to [employer] [place]). Has one child named [name] (special needs).    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    Denies tobacco, drugs or etoh.    Drinks caffeine. Takes a daily vitamin.    Wears seatbelt. Smoke detector in the home.    Exercises routinely.    Feels safe in his relationships     Allergies as of 09/04/2022       Reactions   Aspirin    hives        Medication List        Accurate as of September 04, 2022  3:03 PM. If you have any questions, ask your nurse or doctor.          STOP taking these medications    Accu-Chek Guide test strip Generic drug: glucose blood Stopped by: Howard Pouch, DO   Albuterol Sulfate 108 (90 Base) MCG/ACT Aepb Commonly known as: ProAir RespiClick Stopped by: Howard Pouch, DO   benzonatate 100 MG capsule Commonly known as: TESSALON Stopped by: Howard Pouch, DO   loratadine 10 MG tablet Commonly known as: CLARITIN Stopped by: Howard Pouch, DO   promethazine-dextromethorphan 6.25-15 MG/5ML syrup Commonly known as: PROMETHAZINE-DM Stopped by: Howard Pouch, DO       TAKE these medications    atorvastatin 20 MG tablet Commonly known as: LIPITOR Take 1 tablet (20 mg) by mouth daily.   dapagliflozin propanediol 10 MG Tabs tablet Commonly known as: Farxiga Take 1 tablet  (10 mg total) by mouth daily before breakfast.   Dexcom G6 Receiver Devi Monitor blood glucose TID   Dexcom G6 Sensor Misc  Monitor blood glucose TID   Dexcom G6 Transmitter Misc Monitor blood glucose TID   DULoxetine 60 MG capsule Commonly known as: Cymbalta Take 1 capsule (60 mg total) by mouth 2 (two) times daily.   hydrochlorothiazide 25 MG tablet Commonly known as: HYDRODIURIL Take 1 tablet (25 mg total) by mouth daily.   lisinopril 20 MG tablet Commonly known as: ZESTRIL Take 0.5 tablets (10 mg total) by mouth daily.   meloxicam 15 MG tablet Commonly known as: MOBIC Take 1 tablet (15 mg total) by mouth daily.   metFORMIN 1000 MG tablet Commonly known as: GLUCOPHAGE Take 1 tablet (1,000 mg total) by mouth 2 (two) times daily with a meal.   pregabalin 150 MG capsule Commonly known as: LYRICA Take 1 capsule (150 mg total) by mouth 2 (two) times daily.   Vitamin D (Ergocalciferol) 1.25 MG (50000 UNIT) Caps capsule Commonly known as: DRISDOL Take 1 capsule (50,000 Units total) by mouth every 7 (seven) days.       All past medical history, surgical history, allergies, family history, immunizations andmedications were updated in the EMR today and reviewed under the history and medication portions of their EMR.       ROS: 14 pt review of systems performed and negative (unless mentioned in an HPI)  Objective: BP 132/87   Pulse (!) 105   Temp 98 F (36.7 C)   Wt (!) 373 lb 3.2 oz (169.3 kg)   SpO2 94%   BMI 48.57 kg/m  Physical Exam Vitals and nursing note reviewed.  Constitutional:      General: He is not in acute distress.    Appearance: Normal appearance. He is not ill-appearing, toxic-appearing or diaphoretic.  HENT:     Head: Normocephalic and atraumatic.  Eyes:     General: No scleral icterus.       Right eye: No discharge.        Left eye: No discharge.     Extraocular Movements: Extraocular movements intact.     Pupils: Pupils are equal, round,  and reactive to light.  Cardiovascular:     Rate and Rhythm: Normal rate and regular rhythm.  Pulmonary:     Effort: Pulmonary effort is normal. No respiratory distress.     Breath sounds: Normal breath sounds. No wheezing, rhonchi or rales.  Musculoskeletal:     Cervical back: Neck supple.     Right lower leg: No edema.     Left lower leg: No edema.  Lymphadenopathy:     Cervical: No cervical adenopathy.  Skin:    General: Skin is warm and dry.     Coloration: Skin is not jaundiced or pale.     Findings: No rash.  Neurological:     Mental Status: He is alert and oriented to person, place, and time. Mental status is at baseline.  Psychiatric:        Mood and Affect: Mood normal.        Behavior: Behavior normal.        Thought Content: Thought content normal.        Judgment: Judgment normal.    Diabetic Foot Exam - Simple   Simple Foot Form Diabetic Foot exam was performed with the following findings: Yes 09/04/2022  3:03 PM  Visual Inspection No deformities, no ulcerations, no other skin breakdown bilaterally: Yes Sensation Testing Intact to touch and monofilament testing bilaterally: Yes Pulse Check Posterior Tibialis and Dorsalis pulse intact bilaterally: Yes Comments Thickened long nails  No results found for this or any previous visit (from the past 48 hour(s)).   Assessment/plan: Sabastien Nienow is a 47 y.o. male present for [facility] Lumbar radiculopathy/back pain/obesity Stable Continue Lyrica 150 mg twice daily. Continue Cymbalta 60 mg to twice daily Continue  Mobic 15 mg daily with food. - Xray completed 11/2018.Mild multilevel degenerative disc disease with small anterior osteophytes. No fracture or traumatic malalignment. -Accommodation form for employer completed in the past -Trial of Elavil; was not helpful.    Anxiety with depression Stable Continue Cymbalta to 60 mg to twice daily Continue Lyrica 150 mg twice daily (dx: Diabetic neuropathy) - 30d  script  Essential hypertension, benign/elevated Tg/morbid obesity Stable -  goal of <130/80.  Continue HCTZ 25 mg QD Continue lisinopril 10 mg qd. - low salt diet. Labs up-to-date  Vit d def: 12.20 vit d level Supplementing with vit d Lab due today- vit d collected  Type 2 diabetes mellitus with hyperlipidemia (HCC) A1c 5.9> 6.8>7.1>collected today - Continue the diet and weight loss Continue metformin to 1000 mg twice daily. Continue Farxiga Continue to atorvastatin 20 mg daily - Janumet, Ozempic and Victoza became too expensive.   PNA series: pneumovax completed  Eye exam up-to-date 08/2021- encouraged  Foot exam up-to-date 09/04/2022> referred to podiatry  Return in about 17 weeks (around 01/01/2023) for Routine chronic condition follow-up.  Orders Placed This Encounter  Procedures   Vitamin D (25 hydroxy)   Hemoglobin A1c   Ambulatory referral to Podiatry    Meds ordered this encounter  Medications   dapagliflozin propanediol (FARXIGA) 10 MG TABS tablet    Sig: Take 1 tablet (10 mg total) by mouth daily before breakfast.    Dispense:  90 tablet    Refill:  1   DULoxetine (CYMBALTA) 60 MG capsule    Sig: Take 1 capsule (60 mg total) by mouth 2 (two) times daily.    Dispense:  180 capsule    Refill:  1   hydrochlorothiazide (HYDRODIURIL) 25 MG tablet    Sig: Take 1 tablet (25 mg total) by mouth daily.    Dispense:  90 tablet    Refill:  1   lisinopril (ZESTRIL) 20 MG tablet    Sig: Take 0.5 tablets (10 mg total) by mouth daily.    Dispense:  90 tablet    Refill:  1   meloxicam (MOBIC) 15 MG tablet    Sig: Take 1 tablet (15 mg total) by mouth daily.    Dispense:  90 tablet    Refill:  1   Vitamin D, Ergocalciferol, (DRISDOL) 1.25 MG (50000 UNIT) CAPS capsule    Sig: Take 1 capsule (50,000 Units total) by mouth every 7 (seven) days.    Dispense:  12 capsule    Refill:  0   pregabalin (LYRICA) 150 MG capsule    Sig: Take 1 capsule (150 mg total) by mouth 2  (two) times daily.    Dispense:  60 capsule    Refill:  5   Referral Orders         Ambulatory referral to Podiatry       Note is dictated utilizing voice recognition software. Although note has been proof read prior to signing, occasional typographical errors still can be missed. If any questions arise, please do not hesitate to call for verification.  Electronically signed by: Howard Pouch, DO [place]

## 2022-09-05 ENCOUNTER — Encounter: Payer: Self-pay | Admitting: Family Medicine

## 2022-09-05 LAB — HEMOGLOBIN A1C
Hgb A1c MFr Bld: 7.1 % of total Hgb — ABNORMAL HIGH (ref <5.7)
Mean Plasma Glucose: 157 mg/dL
eAG (mmol/L): 8.7 mmol/L

## 2022-09-05 LAB — VITAMIN D 25 HYDROXY (VIT D DEFICIENCY, FRACTURES): Vit D, 25-Hydroxy: 37 ng/mL (ref 30–100)

## 2022-09-06 MED ORDER — DEXCOM G7 SENSOR MISC: 11 refills | Status: DC

## 2022-09-06 NOTE — Addendum Note (Signed)
Addended by: Emi Holes D on: 09/06/2022 11:04 AM   Modules accepted: Orders

## 2022-09-06 NOTE — Telephone Encounter (Signed)
ok 

## 2022-10-02 ENCOUNTER — Other Ambulatory Visit: Payer: Self-pay

## 2022-10-02 MED ORDER — METFORMIN HCL 1000 MG PO TABS: 1000.0000 mg | ORAL_TABLET | Freq: Two times a day (BID) | ORAL | 1 refills | Status: DC

## 2022-10-14 ENCOUNTER — Other Ambulatory Visit: Payer: Self-pay

## 2022-11-01 ENCOUNTER — Ambulatory Visit
Admission: EM | Admit: 2022-11-01 | Discharge: 2022-11-01 | Disposition: A | Discharge status: Home / Self Care | Payer: BC Managed Care – PPO | Attending: Nurse Practitioner | Admitting: Nurse Practitioner

## 2022-11-01 DIAGNOSIS — J069 Acute upper respiratory infection, unspecified: Secondary | ICD-10-CM | POA: Diagnosis not present

## 2022-11-01 DIAGNOSIS — Z1152 Encounter for screening for COVID-19: Secondary | ICD-10-CM | POA: Insufficient documentation

## 2022-11-01 DIAGNOSIS — J029 Acute pharyngitis, unspecified: Secondary | ICD-10-CM

## 2022-11-01 DIAGNOSIS — R051 Acute cough: Secondary | ICD-10-CM

## 2022-11-01 DIAGNOSIS — Z8709 Personal history of other diseases of the respiratory system: Secondary | ICD-10-CM

## 2022-11-01 LAB — POCT RAPID STREP A (OFFICE): Rapid Strep A Screen: NEGATIVE

## 2022-11-01 MED ORDER — PSEUDOEPHEDRINE HCL 60 MG PO TABS: 60.0000 mg | ORAL_TABLET | Freq: Four times a day (QID) | ORAL | 0 refills | Status: DC | PRN

## 2022-11-01 MED ORDER — ALBUTEROL SULFATE HFA 108 (90 BASE) MCG/ACT IN AERS: 1.0000 | INHALATION_SPRAY | Freq: Four times a day (QID) | RESPIRATORY_TRACT | 0 refills | Status: AC | PRN

## 2022-11-01 NOTE — ED Provider Notes (Signed)
[facility]    CSN: [id number] Arrival date & time: 11/01/22  1307      History   Chief Complaint No chief complaint on file.   HPI Christopher Ingram is a 47 y.o. male  presents for evaluation of URI symptoms for 2 days. Patient reports associated symptoms of ST, cough, congestion, watery eyes, and hives on face. Denies N/V/D, fevers, ear pain, body aches, or SOB. Patient does  have a hx of asthma. States has not had an issue in years. Has an expired inhaler. no smoking. No known sick contacts.  Pt has taken zyrtec and a previous cough medicine prescribed to him last time he was sick. He has also taken melatonin OTC for symptoms. Pt has no other concerns at this time.   HPI  Past Medical History:  Diagnosis Date   Allergy    Anterior femoral cutaneous neuropathy of right lower extremity    Anxiety    Asthma    Chicken pox    Depression, major    GERD (gastroesophageal reflux disease)    Hemorrhoids 11/03/2020   Hypertension    Internal hemorrhoids    rectal bleeding, colonoscopy   Migraine    OSA on CPAP 04/2012   AHI23   RAD (reactive airway disease)    ? 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[facility]    Patient Active Problem List   Diagnosis Date Noted   Vitamin D deficiency 04/04/2022   BMI 45.0-49.9, adult (HCC) 07/05/2021   Diabetic neuropathy associated with type 2 diabetes mellitus (HCC) 07/03/2021   Colon cancer screening 11/03/2020   Lumbar radicular pain 01/17/2020   Type 2 diabetes mellitus with hyperlipidemia (HCC) 10/12/2019   Anxiety with depression 05/22/2016   Essential hypertension 01/03/2016   OSA on CPAP 01/03/2016   Morbid obesity (HCC) 01/03/2016    Past Surgical History:  Procedure Laterality Date   EYE SURGERY     lazy eye       Home Medications    Prior to Admission medications   Medication Sig Start Date End Date Taking? Authorizing Provider   albuterol (VENTOLIN HFA) 108 (90 Base) MCG/ACT inhaler Inhale 1-2 puffs into the lungs every 6 (six) hours as needed for wheezing or shortness of breath. 11/01/22  Yes Radford Pax, NP  pseudoephedrine (SUDAFED) 60 MG tablet Take 1 tablet (60 mg total) by mouth every 6 (six) hours as needed for congestion. 11/01/22  Yes Radford Pax, NP  atorvastatin (LIPITOR) 20 MG tablet Take 1 tablet (20 mg) by mouth daily. 04/03/22   Claiborne Billings, Renee A, DO  Continuous Blood Gluc Receiver (DEXCOM G6 RECEIVER) DEVI Monitor blood glucose TID 07/20/21   Kuneff, Renee A, DO  Continuous Blood Gluc Sensor (DEXCOM G6 SENSOR) MISC Monitor blood glucose TID 07/04/22   Kuneff, Renee A, DO  Continuous Blood Gluc Sensor (DEXCOM G7 SENSOR) MISC Monitor blood glucose TID 09/06/22   Kuneff, Renee A, DO  Continuous Blood Gluc Transmit (DEXCOM G6 TRANSMITTER) MISC Monitor blood glucose TID 08/14/22   Kuneff, Renee A, DO  dapagliflozin propanediol (FARXIGA) 10 MG TABS tablet Take 1 tablet (10 mg total) by mouth daily before breakfast. 09/04/22   Kuneff, Renee A, DO  DULoxetine (CYMBALTA) 60 MG capsule Take 1 capsule (60 mg total) by mouth 2 (two) times daily. 09/04/22   Kuneff, Renee A, DO  hydrochlorothiazide (HYDRODIURIL) 25 MG tablet  Take 1 tablet (25 mg total) by mouth daily. 09/04/22   Kuneff, Renee A, DO  lisinopril (ZESTRIL) 20 MG tablet Take 0.5 tablets (10 mg total) by mouth daily. 09/04/22   Kuneff, Renee A, DO  meloxicam (MOBIC) 15 MG tablet Take 1 tablet (15 mg total) by mouth daily. 09/04/22   Kuneff, Renee A, DO  metFORMIN (GLUCOPHAGE) 1000 MG tablet Take 1 tablet (1,000 mg total) by mouth 2 (two) times daily with a meal. 10/02/22   Kuneff, Renee A, DO  pregabalin (LYRICA) 150 MG capsule Take 1 capsule (150 mg total) by mouth 2 (two) times daily. 09/04/22   Kuneff, Renee A, DO  Vitamin D, Ergocalciferol, (DRISDOL) 1.25 MG (50000 UNIT) CAPS capsule Take 1 capsule (50,000 Units total) by mouth every 7 (seven) days. 09/04/22   Natalia Leatherwood, DO     Family History Family History  Problem Relation Age of Onset   Diabetes Mother    Colon polyps Mother    Diabetes Father    Early death Father        heart attack 61   Breast cancer Sister    Liver cancer Sister    Mental illness Brother    Colon cancer Neg Hx    Esophageal cancer Neg Hx    Inflammatory bowel disease Neg Hx    Pancreatic cancer Neg Hx    Rectal cancer Neg Hx    Stomach cancer Neg Hx     Social History Social History   Tobacco Use   Smoking status: Never   Smokeless tobacco: Never  Vaping Use   Vaping Use: Never used  Substance Use Topics   Alcohol use: No   Drug use: No     Allergies   Aspirin   Review of Systems Review of Systems  HENT:  Positive for congestion and sore throat.   Eyes:        Watery eyes  Respiratory:  Positive for cough.   Skin:        Hives on face     Physical Exam Triage Vital Signs ED Triage Vitals  Enc Vitals Group     BP 11/01/22 1317 124/84     Pulse Rate 11/01/22 1317 92     Resp 11/01/22 1317 18     Temp 11/01/22 1317 97.9 F (36.6 C)     Temp Source 11/01/22 1317 Oral     SpO2 11/01/22 1317 94 %     Weight --      Height --      Head Circumference --      Peak Flow --      Pain Score 11/01/22 1320 3     Pain Loc --      Pain Edu? --      Excl. in GC? --    No data found.  Updated Vital Signs BP 124/84 (BP Location: Right Arm)   Pulse 92   Temp 97.9 F (36.6 C) (Oral)   Resp 18   SpO2 94%   Visual Acuity Right Eye Distance:   Left Eye Distance:   Bilateral Distance:    Right Eye Near:   Left Eye Near:    Bilateral Near:     Physical Exam Vitals and nursing note reviewed.  Constitutional:      General: He is not in acute distress.    Appearance: Normal appearance. He is not ill-appearing or toxic-appearing.  HENT:     Head: Normocephalic and atraumatic.  Right Ear: Tympanic membrane and ear canal normal.     Left Ear: Tympanic membrane and ear canal normal.     Nose:  Congestion present.     Mouth/Throat:     Mouth: Mucous membranes are moist.     Pharynx: Oropharynx is clear. Uvula midline. Posterior oropharyngeal erythema present. No pharyngeal swelling, oropharyngeal exudate or uvula swelling.     Tonsils: No tonsillar abscesses.  Eyes:     Extraocular Movements: Extraocular movements intact.     Conjunctiva/sclera: Conjunctivae normal.     Pupils: Pupils are equal, round, and reactive to light.  Cardiovascular:     Rate and Rhythm: Normal rate and regular rhythm.     Heart sounds: Normal heart sounds.  Pulmonary:     Effort: Pulmonary effort is normal. No respiratory distress.     Breath sounds: Normal breath sounds. No wheezing.  Musculoskeletal:     Cervical back: Normal range of motion and neck supple.  Lymphadenopathy:     Cervical: No cervical adenopathy.  Skin:    General: Skin is warm and dry.     Comments: No hives on face, neck or arms  Neurological:     General: No focal deficit present.     Mental Status: He is alert and oriented to person, place, and time.  Psychiatric:        Mood and Affect: Mood normal.        Behavior: Behavior normal.      UC Treatments / Results  Labs (all labs ordered are listed, but only abnormal results are displayed) Labs Reviewed  SARS CORONAVIRUS 2 (TAT 6-24 HRS)  CULTURE, GROUP A STREP [facility])  POCT RAPID STREP A (OFFICE)    EKG   Radiology No results found.  Procedures Procedures (including critical care time)  Medications Ordered in UC Medications - No data to display  Initial Impression / Assessment and Plan / UC Course  I have reviewed the triage vital signs and the nursing notes.  Pertinent labs & imaging results that were available during my care of the patient were reviewed by me and considered in my medical decision making (see chart for details).     Negative rapid strep, will culture.  COVID PCR and will contact if positive Discussed viral illness and symptomatic  treatment Patient states he will continue to use the cough medicine he had been previously prescribed Trial of Sudafed I did send an albuterol inhaler for him to have on hand if needed PCP follow-up if symptoms do not improve ER precautions reviewed and patient verbalized understanding Final Clinical Impressions(s) / UC Diagnoses   Final diagnoses:  Acute cough  Viral upper respiratory illness  Sore throat  History of asthma     Discharge Instructions      Your rapid strep test was negative.  The clinical contact you with results of your COVID test is positive You may continue the cough medicine you are previously prescribed as needed Sudafed every 6 hours as needed for congestion I have sent an albuterol inhaler to have on hand in case you have any shortness of breath or wheezing Rest and fluids Please follow-up with your PCP if your symptoms do not improve Please go to the ER for any worsening symptoms    ED Prescriptions     Medication Sig Dispense Auth. Provider   pseudoephedrine (SUDAFED) 60 MG tablet Take 1 tablet (60 mg total) by mouth every 6 (six) hours as needed for congestion. 30 tablet  Radford Pax, NP   albuterol (VENTOLIN HFA) 108 (90 Base) MCG/ACT inhaler Inhale 1-2 puffs into the lungs every 6 (six) hours as needed for wheezing or shortness of breath. 1 each Radford Pax, NP      PDMP not reviewed this encounter.   Radford Pax, NP 11/01/22 [id number]

## 2022-11-01 NOTE — ED Triage Notes (Signed)
Pt presents to UC w/ scratchy, sore throat hives on face, coughing, watery eyes x2 days. Cough medication helped him sleep last night.

## 2022-11-01 NOTE — Discharge Instructions (Addendum)
Your rapid strep test was negative.  The clinical contact you with results of your COVID test is positive You may continue the cough medicine you are previously prescribed as needed Sudafed every 6 hours as needed for congestion I have sent an albuterol inhaler to have on hand in case you have any shortness of breath or wheezing Rest and fluids Please follow-up with your PCP if your symptoms do not improve Please go to the ER for any worsening symptoms

## 2022-11-02 LAB — SARS CORONAVIRUS 2 (TAT 6-24 HRS): SARS Coronavirus 2: NEGATIVE

## 2022-11-04 LAB — CULTURE, GROUP A STREP (THRC)

## 2022-11-06 ENCOUNTER — Emergency Department (HOSPITAL_BASED_OUTPATIENT_CLINIC_OR_DEPARTMENT_OTHER)
Admission: EM | Admit: 2022-11-06 | Discharge: 2022-11-06 | Disposition: A | Discharge status: Home / Self Care | Payer: BC Managed Care – PPO | Attending: Emergency Medicine | Admitting: Emergency Medicine

## 2022-11-06 ENCOUNTER — Encounter (HOSPITAL_BASED_OUTPATIENT_CLINIC_OR_DEPARTMENT_OTHER): Payer: Self-pay | Admitting: Emergency Medicine

## 2022-11-06 ENCOUNTER — Other Ambulatory Visit: Payer: Self-pay

## 2022-11-06 ENCOUNTER — Emergency Department (HOSPITAL_BASED_OUTPATIENT_CLINIC_OR_DEPARTMENT_OTHER): Payer: BC Managed Care – PPO

## 2022-11-06 DIAGNOSIS — R002 Palpitations: Secondary | ICD-10-CM | POA: Diagnosis not present

## 2022-11-06 DIAGNOSIS — E119 Type 2 diabetes mellitus without complications: Secondary | ICD-10-CM | POA: Diagnosis not present

## 2022-11-06 DIAGNOSIS — I10 Essential (primary) hypertension: Secondary | ICD-10-CM | POA: Insufficient documentation

## 2022-11-06 DIAGNOSIS — Z79899 Other long term (current) drug therapy: Secondary | ICD-10-CM | POA: Insufficient documentation

## 2022-11-06 DIAGNOSIS — E860 Dehydration: Secondary | ICD-10-CM | POA: Diagnosis not present

## 2022-11-06 DIAGNOSIS — Z7984 Long term (current) use of oral hypoglycemic drugs: Secondary | ICD-10-CM | POA: Insufficient documentation

## 2022-11-06 DIAGNOSIS — R059 Cough, unspecified: Secondary | ICD-10-CM | POA: Diagnosis not present

## 2022-11-06 HISTORY — DX: Type 2 diabetes mellitus without complications: E11.9

## 2022-11-06 LAB — COMPREHENSIVE METABOLIC PANEL
ALT: 22 U/L (ref 0–44)
AST: 22 U/L (ref 15–41)
Albumin: 3.9 g/dL (ref 3.5–5.0)
Alkaline Phosphatase: 46 U/L (ref 38–126)
Anion gap: 10 (ref 5–15)
BUN: 23 mg/dL — ABNORMAL HIGH (ref 6–20)
CO2: 24 mmol/L (ref 22–32)
Calcium: 8.7 mg/dL — ABNORMAL LOW (ref 8.9–10.3)
Chloride: 101 mmol/L (ref 98–111)
Creatinine, Ser: 1.05 mg/dL (ref 0.61–1.24)
GFR, Estimated: >60 mL/min (ref >60)
Glucose, Bld: 85 mg/dL (ref 70–99)
Potassium: 3.7 mmol/L (ref 3.5–5.1)
Sodium: 135 mmol/L (ref 135–145)
Total Bilirubin: 0.3 mg/dL (ref 0.3–1.2)
Total Protein: 7.9 g/dL (ref 6.5–8.1)

## 2022-11-06 LAB — CBC WITH DIFFERENTIAL/PLATELET
Abs Immature Granulocytes: 0.03 K/uL (ref 0.00–0.07)
Basophils Absolute: 0 K/uL (ref 0.0–0.1)
Basophils Relative: 1 %
Eosinophils Absolute: 0.4 K/uL (ref 0.0–0.5)
Eosinophils Relative: 5 %
HCT: 46.6 % (ref 39.0–52.0)
Hemoglobin: 15.1 g/dL (ref 13.0–17.0)
Immature Granulocytes: 0 %
Lymphocytes Relative: 24 %
Lymphs Abs: 2 K/uL (ref 0.7–4.0)
MCH: 28 pg (ref 26.0–34.0)
MCHC: 32.4 g/dL (ref 30.0–36.0)
MCV: 86.3 fL (ref 80.0–100.0)
Monocytes Absolute: 0.8 K/uL (ref 0.1–1.0)
Monocytes Relative: 9 %
Neutro Abs: 5.4 K/uL (ref 1.7–7.7)
Neutrophils Relative %: 61 %
Platelets: 355 K/uL (ref 150–400)
RBC: 5.4 MIL/uL (ref 4.22–5.81)
RDW: 14.4 % (ref 11.5–15.5)
WBC: 8.6 K/uL (ref 4.0–10.5)
nRBC: 0 % (ref 0.0–0.2)

## 2022-11-06 MED ORDER — SODIUM CHLORIDE 0.9 % IV BOLUS
1000.0000 mL | Freq: Once | INTRAVENOUS | Status: AC
  Administered 2022-11-06: 1000 mL via INTRAVENOUS

## 2022-11-06 NOTE — ED Provider Notes (Signed)
[place] [facility] Provider Note   CSN: [id number] Arrival date & time: 11/06/22  1505     History  Chief Complaint  Patient presents with   Dizziness   Palpitations    Christopher Ingram is a 47 y.o. male.  47 year old male with past medical history of HTN, DM, depression, OSA, HLD, GERD, presents with complaint of dizziness onset this morning while getting ready for work described as feeling off balance. Symptoms continued while at work today making phone calls, apple watch alerted HR greater than 120. Patient checked and EKG on his watch that did not detect a fib. Patient called his significant other to pick him up and come to the ER. Is able to ambulate although feels unsteady. No unilateral weakness of numbness, no SHOB or CP, denies fevers, chills. Patient is taking rx cough medicine from when he had COVID earlier this year. Patient was seen at [facility] 11/01/22 for viral URI, sent in inhaler and sudafed (did not know about the sudafed and did not pick this up), feels like he is very congested and has a lot of fluid in his ears.        Home Medications Prior to Admission medications   Medication Sig Start Date End Date Taking? Authorizing Provider  albuterol (VENTOLIN HFA) 108 (90 Base) MCG/ACT inhaler Inhale 1-2 puffs into the lungs every 6 (six) hours as needed for wheezing or shortness of breath. 11/01/22  Yes Radford Pax, NP  atorvastatin (LIPITOR) 20 MG tablet Take 1 tablet (20 mg) by mouth daily. 04/03/22  Yes Kuneff, Renee A, DO  Cholecalciferol (VITAMIN D3) 50 MCG (2000 UT) CAPS Take 2,000 Units by mouth daily.   Yes [provider]  Continuous Blood Gluc Receiver (DEXCOM G6 RECEIVER) DEVI Monitor blood glucose TID 07/20/21  Yes Kuneff, Renee A, DO  Continuous Blood Gluc Sensor (DEXCOM G6 SENSOR) MISC Monitor blood glucose TID 07/04/22  Yes Kuneff, Renee A, DO  Continuous Blood Gluc Sensor (DEXCOM G7 SENSOR) MISC Monitor blood glucose TID  09/06/22  Yes Kuneff, Renee A, DO  Continuous Blood Gluc Transmit (DEXCOM G6 TRANSMITTER) MISC Monitor blood glucose TID 08/14/22  Yes Kuneff, Renee A, DO  dapagliflozin propanediol (FARXIGA) 10 MG TABS tablet Take 1 tablet (10 mg total) by mouth daily before breakfast. 09/04/22  Yes Kuneff, Renee A, DO  DULoxetine (CYMBALTA) 60 MG capsule Take 1 capsule (60 mg total) by mouth 2 (two) times daily. 09/04/22  Yes Kuneff, Renee A, DO  hydrochlorothiazide (HYDRODIURIL) 25 MG tablet Take 1 tablet (25 mg total) by mouth daily. 09/04/22  Yes Kuneff, Renee A, DO  lisinopril (ZESTRIL) 20 MG tablet Take 0.5 tablets (10 mg total) by mouth daily. 09/04/22  Yes Kuneff, Renee A, DO  meloxicam (MOBIC) 15 MG tablet Take 1 tablet (15 mg total) by mouth daily. 09/04/22  Yes Kuneff, Renee A, DO  metFORMIN (GLUCOPHAGE) 1000 MG tablet Take 1 tablet (1,000 mg total) by mouth 2 (two) times daily with a meal. 10/02/22  Yes Kuneff, Renee A, DO  pregabalin (LYRICA) 150 MG capsule Take 1 capsule (150 mg total) by mouth 2 (two) times daily. 09/04/22  Yes Kuneff, Renee A, DO  pseudoephedrine (SUDAFED) 60 MG tablet Take 1 tablet (60 mg total) by mouth every 6 (six) hours as needed for congestion. Patient not taking: Reported on 11/06/2022 11/01/22   Radford Pax, NP  Vitamin D, Ergocalciferol, (DRISDOL) 1.25 MG (50000 UNIT) CAPS capsule Take 1 capsule (  50,000 Units total) by mouth every 7 (seven) days. Patient not taking: Reported on 11/06/2022 09/04/22   Felix Pacini A, DO      Allergies    Aspirin    Review of Systems   Review of Systems Negative except as per HPI Physical Exam Updated Vital Signs BP 126/80   Pulse (!) 102   Temp 98.2 F (36.8 C)   Resp 18   Ht 6\' 3"  (1.905 m)   Wt (!) 165.1 kg   SpO2 99%   BMI 45.50 kg/m  Physical Exam Vitals and nursing note reviewed.  Constitutional:      General: He is not in acute distress.    Appearance: He is well-developed. He is obese. He is not diaphoretic.  HENT:     Head:  Normocephalic and atraumatic.     Right Ear: Tympanic membrane and ear canal normal.     Left Ear: Tympanic membrane and ear canal normal.     Nose: Congestion present.     Mouth/Throat:     Mouth: Mucous membranes are moist.  Eyes:     Conjunctiva/sclera: Conjunctivae normal.  Cardiovascular:     Rate and Rhythm: Regular rhythm. Tachycardia present.     Heart sounds: Normal heart sounds.  Pulmonary:     Effort: Pulmonary effort is normal.     Breath sounds: Normal breath sounds.  Abdominal:     Palpations: Abdomen is soft.     Tenderness: There is no abdominal tenderness.  Musculoskeletal:     Cervical back: Neck supple.     Right lower leg: No edema.     Left lower leg: No edema.  Skin:    General: Skin is warm and dry.     Findings: No erythema or rash.  Neurological:     Mental Status: He is alert and oriented to person, place, and time.     Sensory: No sensory deficit.     Motor: No weakness.  Psychiatric:        Behavior: Behavior normal.     ED Results / Procedures / Treatments   Labs (all labs ordered are listed, but only abnormal results are displayed) Labs Reviewed  COMPREHENSIVE METABOLIC PANEL - Abnormal; Notable for the following components:      Result Value   BUN 23 (*)    Calcium 8.7 (*)    All other components within normal limits  CBC WITH DIFFERENTIAL/PLATELET    EKG None  Radiology DG Chest 2 View  Result Date: 11/06/2022 CLINICAL DATA:  Cough, palpitations. EXAM: CHEST - 2 VIEW COMPARISON:  August 14, 2018. FINDINGS: The heart size and mediastinal contours are within normal limits. Both lungs are clear. The visualized skeletal structures are unremarkable. IMPRESSION: No active cardiopulmonary disease. Electronically Signed   By: Lupita Raider M.D.   On: 11/06/2022 15:58    Procedures Procedures    Medications Ordered in ED Medications  sodium chloride 0.9 % bolus 1,000 mL (0 mLs Intravenous Stopped 11/06/22 1705)    ED Course/ Medical  Decision Making/ A&P                             Medical Decision Making Amount and/or Complexity of Data Reviewed Labs: ordered. Radiology: ordered.   This patient presents to the ED for concern of dizziness, this involves an extensive number of treatment options, and is a complaint that carries with it a high risk of complications and  morbidity.  The differential diagnosis includes but not limited to dehydration, metabolic abnormality, dehydration, arrhythmia    Co morbidities that complicate the patient evaluation  HTN, depression, anxiety, DM, OSA, migraine   Additional history obtained:  External records from outside source obtained and reviewed including prior labs on file for comparison   Lab Tests:  I Ordered, and personally interpreted labs.  The pertinent results include: CBC within limits.  CMP without significant findings.   Imaging Studies ordered:  I ordered imaging studies including chest x-ray I independently visualized and interpreted imaging which showed no acute abnormality I agree with the radiologist interpretation   Cardiac Monitoring: / EKG:  The patient was maintained on a cardiac monitor.  I personally viewed and interpreted the cardiac monitored which showed an underlying rhythm of: Sinus rhythm, rate 98  Problem List / ED Course / Critical interventions / Medication management  47 year old male presents the ER with complaint of dizziness and elevated heart rate.  Symptoms started this morning while he was getting ready for work.  Patient works in a call center, while on [place], he received notification from his watch that his heart rate was higher than 120.  He attempted an EKG on his watch which did not indicate A-fib.  Patient came to the ER with concern for persistently elevated heart rates.  He is ambulatory with a steady gait, does have a mild cold and has been taking prescription cough syrup, feels like his head is very congested.  His workup  today is largely reassuring.  On exam, his heart rate was around 100 resting, when he sat up for his lung exam, his heart rate did increase to 120.  Orthostatics were assessed after IV fluids and found to be normal.  Patient is ambulatory about the department feeling well, heart rate less than 100.  Suspect that patient could have symptoms related to his viral URI and feeling like he has fluid in his ears although his TM exam is unremarkable, consider possible vertigo versus dehydration not drinking well with his illness which has resolved with the IV fluids provided.  Advised return to the ER for any worsening or concerning symptoms otherwise recheck with his PCP if symptoms persist. I ordered medication including IV fluids for orthostatic tachycardia Reevaluation of the patient after these medicines showed that the patient resolved I have reviewed the patients home medicines and have made adjustments as needed   Social Determinants of Health:  Lives with family, has PCP   Test / Admission - Considered:  Improved, stable for discharge         Final Clinical Impression(s) / ED Diagnoses Final diagnoses:  Palpitations  Dehydration    Rx / DC Orders ED Discharge Orders     None         Alden Hipp 11/06/22 2329    Arby Barrette, MD 11/08/22 1755

## 2022-11-06 NOTE — ED Notes (Signed)
Unable to provide urine specimen at this time.  

## 2022-11-06 NOTE — Discharge Instructions (Signed)
Home to rest and hydrate. Mucinex for cold symptoms. Follow up with your PCP, return to the ER for worsening or concerning symptoms.

## 2022-11-06 NOTE — ED Notes (Signed)
Reviewed discharge instructions with pt Pt states understanding. Pt ambulatory at discharge 

## 2022-11-06 NOTE — ED Triage Notes (Addendum)
Pt reports dizziness and heart racing since about 1100; some relief when lying down; felt he had an unsteady gait and near syncope at work; denies pain; pt clammy/slight diaphoresis noted

## 2022-11-15 ENCOUNTER — Ambulatory Visit: Payer: BC Managed Care – PPO | Admitting: Family Medicine

## 2022-11-15 ENCOUNTER — Encounter: Payer: Self-pay | Admitting: Family Medicine

## 2022-11-15 VITALS — BP 132/88 | HR 82 | Temp 97.8°F | Wt 369.0 lb

## 2022-11-15 DIAGNOSIS — Z6841 Body Mass Index (BMI) 40.0 and over, adult: Secondary | ICD-10-CM

## 2022-11-15 DIAGNOSIS — G4733 Obstructive sleep apnea (adult) (pediatric): Secondary | ICD-10-CM | POA: Diagnosis not present

## 2022-11-15 DIAGNOSIS — I10 Essential (primary) hypertension: Secondary | ICD-10-CM

## 2022-11-15 DIAGNOSIS — F418 Other specified anxiety disorders: Secondary | ICD-10-CM

## 2022-11-15 DIAGNOSIS — E1149 Type 2 diabetes mellitus with other diabetic neurological complication: Secondary | ICD-10-CM

## 2022-11-15 DIAGNOSIS — E785 Hyperlipidemia, unspecified: Secondary | ICD-10-CM | POA: Diagnosis not present

## 2022-11-15 DIAGNOSIS — Z7984 Long term (current) use of oral hypoglycemic drugs: Secondary | ICD-10-CM

## 2022-11-15 DIAGNOSIS — M5416 Radiculopathy, lumbar region: Secondary | ICD-10-CM

## 2022-11-15 DIAGNOSIS — E1169 Type 2 diabetes mellitus with other specified complication: Secondary | ICD-10-CM

## 2022-11-15 DIAGNOSIS — E559 Vitamin D deficiency, unspecified: Secondary | ICD-10-CM

## 2022-11-15 LAB — MICROALBUMIN / CREATININE URINE RATIO
Creatinine,U: 245.4 mg/dL
Microalb Creat Ratio: 5.7 mg/g (ref 0.0–30.0)
Microalb, Ur: 14 mg/dL — ABNORMAL HIGH (ref 0.0–1.9)

## 2022-11-15 LAB — POCT GLYCOSYLATED HEMOGLOBIN (HGB A1C)
HbA1c POC (<> result, manual entry): 6.4 % (ref 4.0–5.6)
HbA1c, POC (controlled diabetic range): 6.4 % (ref 0.0–7.0)
HbA1c, POC (prediabetic range): 6.4 % (ref 5.7–6.4)
Hemoglobin A1C: 6.4 % — AB (ref 4.0–5.6)

## 2022-11-15 MED ORDER — LISINOPRIL 20 MG PO TABS: 10.0000 mg | ORAL_TABLET | Freq: Every day | ORAL | 1 refills | Status: DC

## 2022-11-15 MED ORDER — DAPAGLIFLOZIN PROPANEDIOL 10 MG PO TABS: 10.0000 mg | ORAL_TABLET | Freq: Every day | ORAL | 1 refills | Status: DC

## 2022-11-15 MED ORDER — ATORVASTATIN CALCIUM 20 MG PO TABS: ORAL_TABLET | ORAL | 3 refills | Status: DC

## 2022-11-15 MED ORDER — MELOXICAM 15 MG PO TABS: 15.0000 mg | ORAL_TABLET | Freq: Every day | ORAL | 1 refills | Status: DC

## 2022-11-15 MED ORDER — DULOXETINE HCL 60 MG PO CPEP: 60.0000 mg | ORAL_CAPSULE | Freq: Two times a day (BID) | ORAL | 1 refills | Status: DC

## 2022-11-15 MED ORDER — HYDROCHLOROTHIAZIDE 25 MG PO TABS: 25.0000 mg | ORAL_TABLET | Freq: Every day | ORAL | 1 refills | Status: DC

## 2022-11-15 MED ORDER — METFORMIN HCL 1000 MG PO TABS: 1000.0000 mg | ORAL_TABLET | Freq: Two times a day (BID) | ORAL | 1 refills | Status: DC

## 2022-11-15 NOTE — Patient Instructions (Addendum)
Return in about 4 months (around 03/24/2023) for Routine chronic condition follow-up.        Great to see you today.  I have refilled the medication(s) we provide.   If labs were collected, we will inform you of lab results once received either by echart message or telephone call.   - echart message- for normal results that have been seen by the patient already.   - telephone call: abnormal results or if patient has not viewed results in their echart.

## 2022-11-15 NOTE — Progress Notes (Signed)
Patient ID: Christopher Ingram, male  DOB: Aug 07, 1975, 47 y.o.   MRN: [id number] Patient Care Team    Relationship Specialty Notifications Start End  Natalia Leatherwood, DO PCP - General Family Medicine  01/03/16   Coralyn Helling, MD Consulting Physician Pulmonary Disease  10/11/20   [name], Netty Starring., MD Consulting Physician Gastroenterology  02/21/21     Chief Complaint  Patient presents with   Diabetes    Pt rescheduled after ED visit    Subjective: Christopher Ingram is a 47 y.o. male present for cmc  All past medical history, surgical history, allergies, family history, immunizations, medications and social history were updated in the electronic medical record today. All recent labs, ED visits and hospitalizations within the last year were reviewed.  Depression/anxiety/neuropathy-lumbar pain: Patient reports compliance with cymbalta 60 mg daily and Lyrica 150 mg twice daily.  He feels this combination is working rather well for his depression, anxiety and low back pain/diabetic neuropathy.    Tired meds in the past: zoloft. Prozac  Hypertension/morbid obesity/elevated tg : Pt reports c compliance with his HCTZ 25 mg and lisinopril 10 mg. He is also eating a low salt diet.. Patient denies chest pain, shortness of breath, dizziness or lower extremity edema.    He is tolerating statin.  Diet: low sodium, low carb Exercise: routinely RF: HTN, HLD, DM, obesity, FH HD (father MI 30)  Diabetes:  Diagnosed 10/2018 with an a1c of 9.5.  Patient denies dizziness, hyperglycemic or hypoglycemic events. Patient denies numbness, tingling in the extremities or nonhealing wounds of feet.    He has chronic neuropathy in which she states the Lyrica is going well for him. Patient reports compliance with metformin 1000 mg daily and Farxiga 10 mg qd.  Tried meds: Janumet, Ozempic and Victoza all which eventually came too expensive but worked well     11/15/2022    8:14 AM 09/04/2022    1:12 PM 07/05/2021     8:46 AM 02/21/2021    9:57 AM 11/22/2020    2:52 PM  Depression screen PHQ 2/9  Decreased Interest 0 0 1 1 1   Down, Depressed, Hopeless 0 0 0 0 2  PHQ - 2 Score 0 0 1 1 3   Altered sleeping  0 2 0 1  Tired, decreased energy  0 2 1 2   Change in appetite  0 0 2 3  Feeling bad or failure about yourself   0 0 0 0  Trouble concentrating  0 0 0 1  Moving slowly or fidgety/restless  0 0 0 0  Suicidal thoughts  0 0 0 0  PHQ-9 Score  0 5 4 10       07/05/2021    8:46 AM 02/21/2021    9:58 AM 11/22/2020    2:52 PM 10/11/2020    8:54 AM  GAD 7 : Generalized Anxiety Score  Nervous, Anxious, on Edge 1 1 2  0  Control/stop worrying 0 0 1 0  Worry too much - different things 1 0 2 0  Trouble relaxing 1 0 2 1  Restless 0 0 1 0  Easily annoyed or irritable 2 1 2 1   Afraid - awful might happen 0 0 0 0  Total GAD 7 Score 5 2 10 2            11/15/2022    8:14 AM 01/24/2016    3:43 PM 01/03/2016    1:51 PM  Fall Risk   Falls in the past year?  0 No No  Number falls in past yr: 0    Injury with Fall? 0    Risk for fall due to : No Fall Risks    Follow up Falls evaluation completed     Immunization History  Administered Date(s) Administered   Influenza,inj,Quad PF,6+ Mos 05/15/2016, 04/07/2017, 01/26/2019, 02/21/2021, 03/05/2022   Influenza-Unspecified 03/19/2018, 04/01/2020   Moderna Covid-19 Vaccine Bivalent Booster 61yrs & up 05/17/2021   Moderna SARS-COV2 Booster Vaccination 03/05/2022   PFIZER(Purple Top)SARS-COV-2 Vaccination 08/26/2019, 09/20/2019, 04/25/2020   Pneumococcal Polysaccharide-23 01/26/2019   Tdap 10/31/2011, 02/21/2021     Past Medical History:  Diagnosis Date   Allergy    Anterior femoral cutaneous neuropathy of right lower extremity    Anxiety    Asthma    Chicken pox    Depression, major    Diabetes mellitus without complication (HCC)    GERD (gastroesophageal reflux disease)    Hemorrhoids 11/03/2020   Hypertension    Internal hemorrhoids    rectal bleeding,  colonoscopy   Migraine    OSA on CPAP 04/2012   AHI23   RAD (reactive airway disease)    ? 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[employer]   Allergies  Allergen Reactions   Aspirin Hives   Past Surgical History:  Procedure Laterality Date   EYE SURGERY     lazy eye   Family History  Problem Relation Age of Onset   Diabetes Mother    Colon polyps Mother    Diabetes Father    Early death Father        heart attack 65   Breast cancer Sister    Liver cancer Sister    Mental illness Brother    Colon cancer Neg Hx    Esophageal cancer Neg Hx    Inflammatory bowel disease Neg Hx    Pancreatic cancer Neg Hx    Rectal cancer Neg Hx    Stomach cancer Neg Hx    Social History   Social History Narrative   Married to [employer] [place]). Has one child named Christopher Ingram (special needs).    HS grad. Works at [employer] as a Pensions consultant.    Denies tobacco, drugs or etoh.    Drinks caffeine. Takes a daily vitamin.    Wears seatbelt. Smoke detector in the home.    Exercises routinely.    Feels safe in his relationships     Allergies as of 11/15/2022       Reactions   Aspirin Hives        Medication List        Accurate as of November 15, 2022  8:33 AM. If you have any questions, ask your nurse or doctor.          STOP taking these medications    Dexcom G6 Receiver Devi Stopped by: Felix Pacini, DO   Dexcom G6 Transmitter Misc Stopped by: Felix Pacini, DO   pregabalin 150 MG capsule Commonly known as: LYRICA Stopped by: Felix Pacini, DO   pseudoephedrine 60 MG tablet Commonly known as: SUDAFED Stopped by: Felix Pacini, DO       TAKE these medications    albuterol 108 (90 Base) MCG/ACT inhaler Commonly known as: VENTOLIN HFA Inhale 1-2 puffs into the lungs every 6 (six) hours as needed for wheezing or shortness of breath.   atorvastatin 20 MG tablet Commonly known as:  LIPITOR  Take 1 tablet (20 mg) by mouth daily.   dapagliflozin propanediol 10 MG Tabs tablet Commonly known as: Farxiga Take 1 tablet (10 mg total) by mouth daily before breakfast.   Dexcom G7 Sensor Misc Monitor blood glucose TID What changed: Another medication with the same name was removed. Continue taking this medication, and follow the directions you see here. Changed by: Felix Pacini, DO   DULoxetine 60 MG capsule Commonly known as: Cymbalta Take 1 capsule (60 mg total) by mouth 2 (two) times daily.   hydrochlorothiazide 25 MG tablet Commonly known as: HYDRODIURIL Take 1 tablet (25 mg total) by mouth daily.   lisinopril 20 MG tablet Commonly known as: ZESTRIL Take 0.5 tablets (10 mg total) by mouth daily.   meloxicam 15 MG tablet Commonly known as: MOBIC Take 1 tablet (15 mg total) by mouth daily.   metFORMIN 1000 MG tablet Commonly known as: GLUCOPHAGE Take 1 tablet (1,000 mg total) by mouth 2 (two) times daily with a meal.   Vitamin D (Ergocalciferol) 1.25 MG (50000 UNIT) Caps capsule Commonly known as: DRISDOL Take 1 capsule (50,000 Units total) by mouth every 7 (seven) days.   vitamin D3 50 MCG (2000 UT) Caps Take 2,000 Units by mouth daily.       All past medical history, surgical history, allergies, family history, immunizations andmedications were updated in the EMR today and reviewed under the history and medication portions of their EMR.       ROS: 14 pt review of systems performed and negative (unless mentioned in an HPI)  Objective: BP 132/88   Pulse 82   Temp 97.8 F (36.6 C)   Wt (!) 369 lb (167.4 kg)   SpO2 97%   BMI 46.12 kg/m  Physical Exam Vitals and nursing note reviewed.  Constitutional:      General: He is not in acute distress.    Appearance: Normal appearance. He is not ill-appearing, toxic-appearing or diaphoretic.  HENT:     Head: Normocephalic and atraumatic.  Eyes:     General: No scleral icterus.       Right eye: No  discharge.        Left eye: No discharge.     Extraocular Movements: Extraocular movements intact.     Pupils: Pupils are equal, round, and reactive to light.  Cardiovascular:     Rate and Rhythm: Normal rate and regular rhythm.  Pulmonary:     Effort: Pulmonary effort is normal. No respiratory distress.     Breath sounds: Normal breath sounds. No wheezing, rhonchi or rales.  Musculoskeletal:     Right lower leg: No edema.     Left lower leg: No edema.  Skin:    General: Skin is warm.     Coloration: Skin is not pale.     Findings: No rash.  Neurological:     Mental Status: He is alert and oriented to person, place, and time. Mental status is at baseline.  Psychiatric:        Mood and Affect: Mood normal.        Behavior: Behavior normal.        Thought Content: Thought content normal.        Judgment: Judgment normal.     Results for orders placed or performed in visit on 11/15/22 (from the past 48 hour(s))  POCT glycosylated hemoglobin (Hb A1C)     Status: Abnormal   Collection Time: 11/15/22  8:21 AM  Result Value Ref Range  Hemoglobin A1C 6.4 (A) 4.0 - 5.6 %   HbA1c POC (<> result, manual entry) 6.4 4.0 - 5.6 %   HbA1c, POC (prediabetic range) 6.4 5.7 - 6.4 %   HbA1c, POC (controlled diabetic range) 6.4 0.0 - 7.0 %    Assessment/plan: Lark Szeto is a 47 y.o. male present for [facility] Lumbar radiculopathy/back pain/obesity Stable Continue Lyrica 150 mg twice daily. Continue Cymbalta 60 mg to twice daily Continue e  Mobic 15 mg daily with food. - Xray completed 11/2018.Mild multilevel degenerative disc disease with small anterior osteophytes. No fracture or traumatic malalignment. -Accommodation form for employer completed in the past -Trial of Elavil; was not helpful.    Anxiety with depression Stable Continue Cymbalta to 60 mg to twice daily Continue Lyrica 150 mg twice daily (dx: Diabetic neuropathy) - 30d script  Essential hypertension, benign/elevated Tg/morbid  obesity Stable -  goal of <130/80.  Continue HCTZ 25 mg QD Continue lisinopril 10 mg qd. - low salt diet.  Vit d def/hypocalcemia: Supplementing with vit d UTD 09/04/2022-level 37 (was 12) Hypocalcemia labs from ED 6/5-PTH/calcium collected today  Type 2 diabetes mellitus with hyperlipidemia (HCC) A1c 5.9> 6.8>7.1> 7.1>6.4 collected today - Continue the diet and weight loss Continue metformin to 1000 mg twice daily. Continue Farxiga 10 mg daily Continue to atorvastatin 20 mg daily - Janumet, Ozempic and Victoza became too expensive.   PNA series: pneumovax completed  Eye exam up-to-date 08/2022- records requested Foot exam up-to-date 09/04/2022> referred to podiatry  Return in about 4 months (around 03/24/2023) for Routine chronic condition follow-up.  Orders Placed This Encounter  Procedures   Microalbumin / creatinine urine ratio   POCT glycosylated hemoglobin (Hb A1C)    Meds ordered this encounter  Medications   dapagliflozin propanediol (FARXIGA) 10 MG TABS tablet    Sig: Take 1 tablet (10 mg total) by mouth daily before breakfast.    Dispense:  90 tablet    Refill:  1   metFORMIN (GLUCOPHAGE) 1000 MG tablet    Sig: Take 1 tablet (1,000 mg total) by mouth 2 (two) times daily with a meal.    Dispense:  180 tablet    Refill:  1   meloxicam (MOBIC) 15 MG tablet    Sig: Take 1 tablet (15 mg total) by mouth daily.    Dispense:  90 tablet    Refill:  1   lisinopril (ZESTRIL) 20 MG tablet    Sig: Take 0.5 tablets (10 mg total) by mouth daily.    Dispense:  90 tablet    Refill:  1   hydrochlorothiazide (HYDRODIURIL) 25 MG tablet    Sig: Take 1 tablet (25 mg total) by mouth daily.    Dispense:  90 tablet    Refill:  1   DULoxetine (CYMBALTA) 60 MG capsule    Sig: Take 1 capsule (60 mg total) by mouth 2 (two) times daily.    Dispense:  180 capsule    Refill:  1   atorvastatin (LIPITOR) 20 MG tablet    Sig: Take 1 tablet (20 mg) by mouth daily.    Dispense:  90 tablet     Refill:  3   Referral Orders  No referral(s) requested today      Note is dictated utilizing voice recognition software. Although note has been proof read prior to signing, occasional typographical errors still can be missed. If any questions arise, please do not hesitate to call for verification.  Electronically signed by: Felix Pacini, DO  [facility] [place]

## 2022-12-09 ENCOUNTER — Encounter: Payer: Self-pay | Admitting: Family Medicine

## 2022-12-09 NOTE — Telephone Encounter (Signed)
Pt was sent separate MyChart message regarding cologuard kit.

## 2023-01-01 ENCOUNTER — Ambulatory Visit: Payer: BC Managed Care – PPO | Admitting: Family Medicine

## 2023-02-24 ENCOUNTER — Encounter: Payer: Self-pay | Admitting: Family Medicine

## 2023-02-24 ENCOUNTER — Ambulatory Visit (INDEPENDENT_AMBULATORY_CARE_PROVIDER_SITE_OTHER): Payer: BC Managed Care – PPO | Admitting: Family Medicine

## 2023-02-24 VITALS — BP 144/94 | HR 95 | Temp 98.3°F | Wt 369.4 lb

## 2023-02-24 DIAGNOSIS — E785 Hyperlipidemia, unspecified: Secondary | ICD-10-CM

## 2023-02-24 DIAGNOSIS — G4733 Obstructive sleep apnea (adult) (pediatric): Secondary | ICD-10-CM

## 2023-02-24 DIAGNOSIS — F418 Other specified anxiety disorders: Secondary | ICD-10-CM

## 2023-02-24 DIAGNOSIS — E1169 Type 2 diabetes mellitus with other specified complication: Secondary | ICD-10-CM | POA: Diagnosis not present

## 2023-02-24 DIAGNOSIS — Z6841 Body Mass Index (BMI) 40.0 and over, adult: Secondary | ICD-10-CM

## 2023-02-24 DIAGNOSIS — E1149 Type 2 diabetes mellitus with other diabetic neurological complication: Secondary | ICD-10-CM

## 2023-02-24 DIAGNOSIS — I10 Essential (primary) hypertension: Secondary | ICD-10-CM

## 2023-02-24 DIAGNOSIS — Z7984 Long term (current) use of oral hypoglycemic drugs: Secondary | ICD-10-CM

## 2023-02-24 DIAGNOSIS — M5416 Radiculopathy, lumbar region: Secondary | ICD-10-CM

## 2023-02-24 LAB — POCT GLYCOSYLATED HEMOGLOBIN (HGB A1C)
HbA1c POC (<> result, manual entry): 6.4 % (ref 4.0–5.6)
HbA1c, POC (controlled diabetic range): 6.4 % (ref 0.0–7.0)
HbA1c, POC (prediabetic range): 6.4 % (ref 5.7–6.4)
Hemoglobin A1C: 6.4 % — AB (ref 4.0–5.6)

## 2023-02-24 MED ORDER — VITAMIN D (ERGOCALCIFEROL) 1.25 MG (50000 UNIT) PO CAPS: 50000.0000 [IU] | ORAL_CAPSULE | ORAL | 0 refills | Status: DC

## 2023-02-24 MED ORDER — LISINOPRIL 20 MG PO TABS: 20.0000 mg | ORAL_TABLET | Freq: Every day | ORAL | 1 refills | Status: DC

## 2023-02-24 MED ORDER — METFORMIN HCL 1000 MG PO TABS: 1000.0000 mg | ORAL_TABLET | Freq: Two times a day (BID) | ORAL | 1 refills | Status: DC

## 2023-02-24 MED ORDER — HYDROCHLOROTHIAZIDE 25 MG PO TABS
25.0000 mg | ORAL_TABLET | Freq: Every day | ORAL | 1 refills | Status: DC
Start: 2023-02-24 — End: 2023-02-24

## 2023-02-24 MED ORDER — DULOXETINE HCL 60 MG PO CPEP: 60.0000 mg | ORAL_CAPSULE | Freq: Two times a day (BID) | ORAL | 1 refills | Status: DC

## 2023-02-24 MED ORDER — MELOXICAM 15 MG PO TABS: 15.0000 mg | ORAL_TABLET | Freq: Every day | ORAL | 1 refills | Status: DC

## 2023-02-24 NOTE — Progress Notes (Signed)
Patient ID: Christopher Ingram, male  DOB: 1975/12/31, 47 y.o.   MRN: [id number] Patient Care Team    Relationship Specialty Notifications Start End  Christopher Leatherwood, DO PCP - General Family Medicine  01/03/16   Christopher Helling, MD Consulting Physician Pulmonary Disease  10/11/20   [name], Netty Starring., MD Consulting Physician Gastroenterology  02/21/21     Chief Complaint  Patient presents with   Diabetes    Subjective: Christopher Ingram is a 47 y.o. male present for cmc  All past medical history, surgical history, allergies, family history, immunizations, medications and social history were updated in the electronic medical record today. All recent labs, ED visits and hospitalizations within the last year were reviewed.  Depression/anxiety/neuropathy-lumbar pain: Patient reports compliance with cymbalta 60 mg daily and Lyrica 150 mg twice daily.  He feels this combination is working rather well for his depression, anxiety and low back pain/diabetic neuropathy.    Tired meds in the past: zoloft. Prozac  Hypertension/morbid obesity/elevated tg : Pt reports compliance with his HCTZ 25 mg and lisinopril 10 mg. He is also eating a low salt diet.. Patient denies chest pain, shortness of breath, dizziness or lower extremity edema.  He is tolerating statin.  Diet: low sodium, low carb Exercise: routinely RF: HTN, HLD, DM, obesity, FH HD (father MI 84)  Diabetes:  Diagnosed 10/2018 with an a1c of 9.5.  Patient denies dizziness, hyperglycemic or hypoglycemic events. Patient denies numbness, tingling in the extremities or nonhealing wounds of feet.   He has chronic neuropathy in which she states the Lyrica is going well for him. Patient reports compliance with metformin 1000 mg daily. Tried meds: Janumet, Ozempic and Victoza all which eventually came too expensive but worked well     11/15/2022    8:14 AM 09/04/2022    1:12 PM 07/05/2021    8:46 AM 02/21/2021    9:57 AM 11/22/2020    2:52 PM   Depression screen PHQ 2/9  Decreased Interest 0 0 1 1 1   Down, Depressed, Hopeless 0 0 0 0 2  PHQ - 2 Score 0 0 1 1 3   Altered sleeping  0 2 0 1  Tired, decreased energy  0 2 1 2   Change in appetite  0 0 2 3  Feeling bad or failure about yourself   0 0 0 0  Trouble concentrating  0 0 0 1  Moving slowly or fidgety/restless  0 0 0 0  Suicidal thoughts  0 0 0 0  PHQ-9 Score  0 5 4 10       07/05/2021    8:46 AM 02/21/2021    9:58 AM 11/22/2020    2:52 PM 10/11/2020    8:54 AM  GAD 7 : Generalized Anxiety Score  Nervous, Anxious, on Edge 1 1 2  0  Control/stop worrying 0 0 1 0  Worry too much - different things 1 0 2 0  Trouble relaxing 1 0 2 1  Restless 0 0 1 0  Easily annoyed or irritable 2 1 2 1   Afraid - awful might happen 0 0 0 0  Total GAD 7 Score 5 2 10 2            11/15/2022    8:14 AM 01/24/2016    3:43 PM 01/03/2016    1:51 PM  Fall Risk   Falls in the past year? 0 No No  Number falls in past yr: 0    Injury with Fall? 0  Risk for fall due to : No Fall Risks    Follow up Falls evaluation completed     Immunization History  Administered Date(s) Administered   Influenza,inj,Quad PF,6+ Mos 05/15/2016, 04/07/2017, 01/26/2019, 02/21/2021, 03/05/2022   Influenza-Unspecified 03/19/2018, 04/01/2020   Moderna Covid-19 Vaccine Bivalent Booster 53yrs & up 05/17/2021   Moderna SARS-COV2 Booster Vaccination 03/05/2022   PFIZER(Purple Top)SARS-COV-2 Vaccination 08/26/2019, 09/20/2019, 04/25/2020   Pneumococcal Polysaccharide-23 01/26/2019   Tdap 10/31/2011, 02/21/2021     Past Medical History:  Diagnosis Date   Allergy    Anterior femoral cutaneous neuropathy of right lower extremity    Anxiety    Asthma    Chicken pox    Depression, major    Diabetes mellitus without complication (HCC)    GERD (gastroesophageal reflux disease)    Hemorrhoids 11/03/2020   Hypertension    Internal hemorrhoids    rectal bleeding, colonoscopy   Migraine    OSA on CPAP 04/2012    AHI23   RAD (reactive airway disease)    ? 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   Allergies  Allergen Reactions   Aspirin Hives   Past Surgical History:  Procedure Laterality Date   EYE SURGERY     lazy eye   Family History  Problem Relation Age of Onset   Diabetes Mother    Colon polyps Mother    Diabetes Father    Early death Father        heart attack 94   Breast cancer Sister    Liver cancer Sister    Mental illness Brother    Colon cancer Neg Hx    Esophageal cancer Neg Hx    Inflammatory bowel disease Neg Hx    Pancreatic cancer Neg Hx    Rectal cancer Neg Hx    Stomach cancer Neg Hx    Social History   Social History Narrative   Married to Christopher Ingram). Has one child named Christopher Ingram (special needs).    HS grad. Works at [employer] as a Pensions consultant.    Denies tobacco, drugs or etoh.    Drinks caffeine. Takes a daily vitamin.    Wears seatbelt. Smoke detector in the home.    Exercises routinely.    Feels safe in his relationships     Allergies as of 02/24/2023       Reactions   Aspirin Hives        Medication List        Accurate as of February 24, 2023 11:00 AM. If you have any questions, ask your nurse or doctor.          STOP taking these medications    hydrochlorothiazide 25 MG tablet Commonly known as: HYDRODIURIL Stopped by: Christopher Ingram       TAKE these medications    albuterol 108 (90 Base) MCG/ACT inhaler Commonly known as: VENTOLIN HFA Inhale 1-2 puffs into the lungs every 6 (six) hours as needed for wheezing or shortness of breath.   atorvastatin 20 MG tablet Commonly known as: LIPITOR Take 1 tablet (20 mg) by mouth daily.   dapagliflozin propanediol 10 MG Tabs tablet Commonly known as: Farxiga Take 1 tablet (10 mg total) by mouth daily before breakfast.   Dexcom G7 Sensor Misc Monitor blood glucose TID   DULoxetine 60 MG  capsule Commonly known as: Cymbalta Take 1 capsule (60 mg total) by mouth  2 (two) times daily.   lisinopril 20 MG tablet Commonly known as: ZESTRIL Take 1 tablet (20 mg total) by mouth daily. What changed: how much to take Changed by: Christopher Ingram   meloxicam 15 MG tablet Commonly known as: MOBIC Take 1 tablet (15 mg total) by mouth daily.   metFORMIN 1000 MG tablet Commonly known as: GLUCOPHAGE Take 1 tablet (1,000 mg total) by mouth 2 (two) times daily with a meal.   Vitamin D (Ergocalciferol) 1.25 MG (50000 UNIT) Caps capsule Commonly known as: DRISDOL Take 1 capsule (50,000 Units total) by mouth every 7 (seven) days.   vitamin D3 50 MCG (2000 UT) Caps Take 2,000 Units by mouth daily.       All past medical history, surgical history, allergies, family history, immunizations andmedications were updated in the EMR today and reviewed under the history and medication portions of their EMR.       ROS: 14 pt review of systems performed and negative (unless mentioned in an HPI)  Objective: BP (!) 144/94   Pulse 95   Temp 98.3 F (36.8 C)   Wt (!) 369 lb 6.4 oz (167.6 kg)   SpO2 96%   BMI 46.17 kg/m  Physical Exam Vitals and nursing note reviewed.  Constitutional:      General: He is not in acute distress.    Appearance: Normal appearance. He is not ill-appearing, toxic-appearing or diaphoretic.  HENT:     Head: Normocephalic and atraumatic.  Eyes:     General: No scleral icterus.       Right eye: No discharge.        Left eye: No discharge.     Extraocular Movements: Extraocular movements intact.     Pupils: Pupils are equal, round, and reactive to light.  Cardiovascular:     Rate and Rhythm: Normal rate and regular rhythm.  Pulmonary:     Effort: Pulmonary effort is normal. No respiratory distress.     Breath sounds: Normal breath sounds. No wheezing, rhonchi or rales.  Musculoskeletal:     Right lower leg: No edema.     Left lower leg: No edema.  Skin:     General: Skin is warm.     Coloration: Skin is not pale.     Findings: No rash.  Neurological:     Mental Status: He is alert and oriented to person, place, and time. Mental status is at baseline.  Psychiatric:        Mood and Affect: Mood normal.        Behavior: Behavior normal.        Thought Content: Thought content normal.        Judgment: Judgment normal.     Results for orders placed or performed in visit on 02/24/23 (from the past 48 hour(s))  POCT glycosylated hemoglobin (Hb A1C)     Status: Abnormal   Collection Time: 02/24/23 10:46 AM  Result Value Ref Range   Hemoglobin A1C 6.4 (A) 4.0 - 5.6 %   HbA1c POC (<> result, manual entry) 6.4 4.0 - 5.6 %   HbA1c, POC (prediabetic range) 6.4 5.7 - 6.4 %   HbA1c, POC (controlled diabetic range) 6.4 0.0 - 7.0 %     Assessment/plan: Christopher Ingram is a 47 y.o. male present for [facility] Lumbar radiculopathy/back pain/obesity Stable Continue Lyrica 150 mg twice daily. Continue Cymbalta 60 mg to twice daily Continue Mobic 15 mg daily with food. - Xray completed 11/2018.Mild multilevel degenerative disc disease with  small anterior osteophytes. No fracture or traumatic malalignment. -Accommodation form for employer completed in the past -Trial of Elavil; was not helpful.    Anxiety with depression Stable Continue Cymbalta to 60 mg to twice daily Continue Lyrica 150 mg twice daily (dx: Diabetic neuropathy) - 30d script  Essential hypertension, benign/elevated Tg/morbid obesity Stable -  goal of <130/80.  He stopped HCTZ 25 mg every day- not retaining,but BP up. Removed so not needing frequent BR breaks.  Increase lisinopril 10 mg > 20 mg qd. - low salt diet.  Vit d def/hypocalcemia: Supplementing with vit d UTD 09/04/2022-level 37 (was 12) Hypocalcemia labs from ED 6/5-PTH/calcium collected today  Type 2 diabetes mellitus with hyperlipidemia (HCC) A1c 5.9> 6.8>7.1> 7.1>6.4 > 6.4 collected today Discussed sugars will not drop  with use of metformin. Encouraged him to supplement with protein breakfast.  - Continue the diet and weight loss Continue metformin to 1000 mg every day. Continue to atorvastatin 20 mg daily - Janumet, Ozempic and Victoza became too expensive.   PNA series: pneumovax completed  Eye exam up-to-date 08/2022 Foot exam up-to-date 09/04/2022> referred to podiatry   Return in about 24 weeks (around 08/11/2023) for Routine chronic condition follow-up.  Orders Placed This Encounter  Procedures   POCT glycosylated hemoglobin (Hb A1C)    Meds ordered this encounter  Medications   DULoxetine (CYMBALTA) 60 MG capsule    Sig: Take 1 capsule (60 mg total) by mouth 2 (two) times daily.    Dispense:  180 capsule    Refill:  1   DISCONTD: hydrochlorothiazide (HYDRODIURIL) 25 MG tablet    Sig: Take 1 tablet (25 mg total) by mouth daily.    Dispense:  90 tablet    Refill:  1   lisinopril (ZESTRIL) 20 MG tablet    Sig: Take 1 tablet (20 mg total) by mouth daily.    Dispense:  90 tablet    Refill:  1   meloxicam (MOBIC) 15 MG tablet    Sig: Take 1 tablet (15 mg total) by mouth daily.    Dispense:  90 tablet    Refill:  1   metFORMIN (GLUCOPHAGE) 1000 MG tablet    Sig: Take 1 tablet (1,000 mg total) by mouth 2 (two) times daily with a meal.    Dispense:  90 tablet    Refill:  1   Vitamin D, Ergocalciferol, (DRISDOL) 1.25 MG (50000 UNIT) CAPS capsule    Sig: Take 1 capsule (50,000 Units total) by mouth every 7 (seven) days.    Dispense:  12 capsule    Refill:  0   Referral Orders  No referral(s) requested today      Note is dictated utilizing voice recognition software. Although note has been proof read prior to signing, occasional typographical errors still can be missed. If any questions arise, please do not hesitate to call for verification.  Electronically signed by: Christopher Pacini, DO [facility] [place]

## 2023-02-24 NOTE — Telephone Encounter (Signed)
Discussed with pt today. This accomodation is not required for him. We made medication changes instead

## 2023-02-24 NOTE — Patient Instructions (Addendum)
Return in about 24 weeks (around 08/11/2023) for Routine chronic condition follow-up.  Call in next week with BP readings.       Great to see you today.  I have refilled the medication(s) we provide.   If labs were collected or images ordered, we will inform you of  results once we have received them and reviewed. We will contact you either by echart message, or telephone call.  Please give ample time to the testing facility, and our office to run,  receive and review results. Please do not call inquiring of results, even if you can see them in your chart. We will contact you as soon as we are able. If it has been over 1 week since the test was completed, and you have not yet heard from [place], then please call us.    - echart message- for normal results that have been seen by the patient already.   - telephone call: abnormal results or if patient has not viewed results in their echart.  If a referral to a specialist was entered for you, please call us in 2 weeks if you have not heard from the specialist office to schedule.

## 2023-03-17 ENCOUNTER — Ambulatory Visit: Payer: BC Managed Care – PPO | Admitting: Family Medicine

## 2023-03-27 ENCOUNTER — Ambulatory Visit: Payer: Self-pay

## 2023-03-27 NOTE — Progress Notes (Signed)
DM eye exam request

## 2023-07-02 ENCOUNTER — Ambulatory Visit: Payer: BC Managed Care – PPO | Admitting: Family Medicine

## 2023-07-02 ENCOUNTER — Encounter: Payer: Self-pay | Admitting: Family Medicine

## 2023-07-02 VITALS — BP 132/82 | HR 96 | Temp 98.0°F | Wt 376.4 lb

## 2023-07-02 DIAGNOSIS — Z23 Encounter for immunization: Secondary | ICD-10-CM | POA: Diagnosis not present

## 2023-07-02 DIAGNOSIS — Z7984 Long term (current) use of oral hypoglycemic drugs: Secondary | ICD-10-CM

## 2023-07-02 DIAGNOSIS — Z6841 Body Mass Index (BMI) 40.0 and over, adult: Secondary | ICD-10-CM

## 2023-07-02 DIAGNOSIS — F418 Other specified anxiety disorders: Secondary | ICD-10-CM

## 2023-07-02 DIAGNOSIS — I10 Essential (primary) hypertension: Secondary | ICD-10-CM | POA: Diagnosis not present

## 2023-07-02 DIAGNOSIS — E1169 Type 2 diabetes mellitus with other specified complication: Secondary | ICD-10-CM

## 2023-07-02 DIAGNOSIS — E785 Hyperlipidemia, unspecified: Secondary | ICD-10-CM

## 2023-07-02 DIAGNOSIS — M5416 Radiculopathy, lumbar region: Secondary | ICD-10-CM

## 2023-07-02 DIAGNOSIS — E559 Vitamin D deficiency, unspecified: Secondary | ICD-10-CM

## 2023-07-02 DIAGNOSIS — E1149 Type 2 diabetes mellitus with other diabetic neurological complication: Secondary | ICD-10-CM

## 2023-07-02 DIAGNOSIS — G4733 Obstructive sleep apnea (adult) (pediatric): Secondary | ICD-10-CM | POA: Diagnosis not present

## 2023-07-02 LAB — POCT GLYCOSYLATED HEMOGLOBIN (HGB A1C)
HbA1c POC (<> result, manual entry): 7.2 % (ref 4.0–5.6)
HbA1c, POC (controlled diabetic range): 7.2 % — AB (ref 0.0–7.0)
HbA1c, POC (prediabetic range): 7.2 % — AB (ref 5.7–6.4)
Hemoglobin A1C: 7.2 % — AB (ref 4.0–5.6)

## 2023-07-02 MED ORDER — HYDROCHLOROTHIAZIDE 25 MG PO TABS: 25.0000 mg | ORAL_TABLET | Freq: Every day | ORAL | 1 refills | Status: DC

## 2023-07-02 MED ORDER — VITAMIN D (ERGOCALCIFEROL) 1.25 MG (50000 UNIT) PO CAPS: 50000.0000 [IU] | ORAL_CAPSULE | ORAL | 3 refills | Status: DC

## 2023-07-02 MED ORDER — MELOXICAM 15 MG PO TABS: 15.0000 mg | ORAL_TABLET | Freq: Every day | ORAL | 1 refills | Status: DC

## 2023-07-02 MED ORDER — GLIPIZIDE 5 MG PO TABS: 5.0000 mg | ORAL_TABLET | Freq: Every day | ORAL | 1 refills | Status: DC

## 2023-07-02 MED ORDER — METFORMIN HCL 1000 MG PO TABS: 1000.0000 mg | ORAL_TABLET | Freq: Two times a day (BID) | ORAL | 1 refills | Status: DC

## 2023-07-02 MED ORDER — TRAZODONE HCL 50 MG PO TABS: 50.0000 mg | ORAL_TABLET | Freq: Every evening | ORAL | 1 refills | Status: DC | PRN

## 2023-07-02 MED ORDER — DULOXETINE HCL 60 MG PO CPEP: 60.0000 mg | ORAL_CAPSULE | Freq: Two times a day (BID) | ORAL | 1 refills | Status: DC

## 2023-07-02 MED ORDER — ATORVASTATIN CALCIUM 20 MG PO TABS: ORAL_TABLET | ORAL | 3 refills | Status: DC

## 2023-07-02 MED ORDER — LISINOPRIL 30 MG PO TABS: 30.0000 mg | ORAL_TABLET | Freq: Every day | ORAL | 1 refills | Status: DC

## 2023-07-02 NOTE — Patient Instructions (Signed)
Return in about 15 weeks (around 10/15/2023).        Great to see you today.  I have refilled the medication(s) we provide.   If labs were collected or images ordered, we will inform you of  results once we have received them and reviewed. We will contact you either by echart message, or telephone call.  Please give ample time to the testing facility, and our office to run,  receive and review results. Please do not call inquiring of results, even if you can see them in your chart. We will contact you as soon as we are able. If it has been over 1 week since the test was completed, and you have not yet heard from [place], then please call us.    - echart message- for normal results that have been seen by the patient already.   - telephone call: abnormal results or if patient has not viewed results in their echart.  If a referral to a specialist was entered for you, please call us in 2 weeks if you have not heard from the specialist office to schedule.

## 2023-07-02 NOTE — Progress Notes (Signed)
Patient ID: Christopher Ingram, male  DOB: Oct 29, 1975, 48 y.o.   MRN: [id number] Patient Care Team    Relationship Specialty Notifications Start End  Natalia Leatherwood, DO PCP - General Family Medicine  01/03/16   Coralyn Helling, MD (Inactive) Consulting Physician Pulmonary Disease  10/11/20   [name], Netty Starring., MD Consulting Physician Gastroenterology  02/21/21     Chief Complaint  Patient presents with   Diabetes    Subjective: Christopher Ingram is a 48 y.o. male present for cmc  All past medical history, surgical history, allergies, family history, immunizations, medications and social history were updated in the electronic medical record today. All recent labs, ED visits and hospitalizations within the last year were reviewed.  Depression/anxiety/neuropathy-lumbar pain: Patient reports compliance with cymbalta 60 mg daily and Lyrica 150 mg twice daily.  He feels this combination is working well for his depression, anxiety and low back pain/diabetic neuropathy.    Tired meds in the past: zoloft. Prozac  Hypertension/morbid obesity/elevated tg : Pt reports compliance with his HCTZ 25 mg and lisinopril 10 mg. He is also eating a low salt diet.. Patient denies chest pain, shortness of breath, dizziness or lower extremity edema.  He is tolerating statin.  Diet: low sodium, low carb Exercise: routinely RF: HTN, HLD, DM, obesity, FH HD (father MI 23)  Diabetes:  Diagnosed 10/2018 with an a1c of 9.5.  Patient denies dizziness, hyperglycemic or hypoglycemic events. Patient denies numbness, tingling in the extremities or nonhealing wounds of feet.  He has chronic neuropathy in which she states the Lyrica is going well for him. Patient reports compliance with metformin 1000 mg daily. Tried meds: Janumet, Ozempic and Victoza all which eventually came too expensive but worked well     07/02/2023    1:16 PM 11/15/2022    8:14 AM 09/04/2022    1:12 PM 07/05/2021    8:46 AM 02/21/2021    9:57 AM   Depression screen PHQ 2/9  Decreased Interest 3 0 0 1 1  Down, Depressed, Hopeless 3 0 0 0 0  PHQ - 2 Score 6 0 0 1 1  Altered sleeping 3  0 2 0  Tired, decreased energy 3  0 2 1  Change in appetite 1  0 0 2  Feeling bad or failure about yourself  1  0 0 0  Trouble concentrating 0  0 0 0  Moving slowly or fidgety/restless 0  0 0 0  Suicidal thoughts 0  0 0 0  PHQ-9 Score 14  0 5 4  Difficult doing work/chores Very difficult          07/02/2023    1:16 PM 07/05/2021    8:46 AM 02/21/2021    9:58 AM 11/22/2020    2:52 PM  GAD 7 : Generalized Anxiety Score  Nervous, Anxious, on Edge  1 1 2   Control/stop worrying 3 0 0 1  Worry too much - different things 3 1 0 2  Trouble relaxing 3 1 0 2  Restless 1 0 0 1  Easily annoyed or irritable 0 2 1 2   Afraid - awful might happen 0 0 0 0  Total GAD 7 Score  5 2 10   Anxiety Difficulty Very difficult              07/02/2023    1:15 PM 11/15/2022    8:14 AM 01/24/2016    3:43 PM 01/03/2016    1:51 PM  Fall Risk  Falls in the past year? 0 0 No No  Number falls in past yr:  0    Injury with Fall?  0    Risk for fall due to :  No Fall Risks    Follow up Falls evaluation completed Falls evaluation completed     Immunization History  Administered Date(s) Administered   Influenza,inj,Quad PF,6+ Mos 05/15/2016, 04/07/2017, 01/26/2019, 02/21/2021, 03/05/2022   Influenza-Unspecified 03/19/2018, 04/01/2020   Moderna Covid-19 Vaccine Bivalent Booster 33yrs & up 05/17/2021   Moderna SARS-COV2 Booster Vaccination 03/05/2022   PFIZER(Purple Top)SARS-COV-2 Vaccination 08/26/2019, 09/20/2019, 04/25/2020   PNEUMOCOCCAL CONJUGATE-20 07/02/2023   Pneumococcal Polysaccharide-23 01/26/2019   Tdap 10/31/2011, 02/21/2021     Past Medical History:  Diagnosis Date   Allergy    Anterior femoral cutaneous neuropathy of right lower extremity    Anxiety    Asthma    Chicken pox    Depression, major    Diabetes mellitus without complication (HCC)     GERD (gastroesophageal reflux disease)    Hemorrhoids 11/03/2020   Hypertension    Internal hemorrhoids    rectal bleeding, colonoscopy   Migraine    OSA on CPAP 04/2012   AHI23   RAD (reactive airway disease)    ? 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[facility]   Allergies  Allergen Reactions   Aspirin Hives   Past Surgical History:  Procedure Laterality Date   EYE SURGERY     lazy eye   Family History  Problem Relation Age of Onset   Diabetes Mother    Colon polyps Mother    Diabetes Father    Early death Father        heart attack 46   Breast cancer Sister    Liver cancer Sister    Mental illness Brother    Colon cancer Neg Hx    Esophageal cancer Neg Hx    Inflammatory bowel disease Neg Hx    Pancreatic cancer Neg Hx    Rectal cancer Neg Hx    Stomach cancer Neg Hx    Social History   Social History Narrative   Married to Christopher Ingram). Has one child named Christopher Ingram (special needs).    HS grad. Works at [employer] as a Pensions consultant.    Denies tobacco, drugs or etoh.    Drinks caffeine. Takes a daily vitamin.    Wears seatbelt. Smoke detector in the home.    Exercises routinely.    Feels safe in his relationships     Allergies as of 07/02/2023       Reactions   Aspirin Hives        Medication List        Accurate as of July 02, 2023  1:43 PM. If you have any questions, ask your nurse or doctor.          STOP taking these medications    dapagliflozin propanediol 10 MG Tabs tablet Commonly known as: Farxiga Stopped by: Felix Pacini       TAKE these medications    albuterol 108 (90 Base) MCG/ACT inhaler Commonly known as: VENTOLIN HFA Inhale 1-2 puffs into the lungs every 6 (six) hours as needed for wheezing or shortness of breath.   atorvastatin 20 MG tablet Commonly known as: LIPITOR Take 1 tablet (20 mg) by mouth daily.   Dexcom G7 Sensor  Misc Monitor blood glucose TID  DULoxetine 60 MG capsule Commonly known as: Cymbalta Take 1 capsule (60 mg total) by mouth 2 (two) times daily.   glipiZIDE 5 MG tablet Commonly known as: GLUCOTROL Take 1 tablet (5 mg total) by mouth daily before breakfast. Started by: Felix Pacini   hydrochlorothiazide 25 MG tablet Commonly known as: HYDRODIURIL Take 1 tablet (25 mg total) by mouth daily. Started by: Felix Pacini   lisinopril 30 MG tablet Commonly known as: ZESTRIL Take 1 tablet (30 mg total) by mouth daily. What changed:  medication strength how much to take Changed by: Felix Pacini   meloxicam 15 MG tablet Commonly known as: MOBIC Take 1 tablet (15 mg total) by mouth daily.   metFORMIN 1000 MG tablet Commonly known as: GLUCOPHAGE Take 1 tablet (1,000 mg total) by mouth 2 (two) times daily with a meal.   traZODone 50 MG tablet Commonly known as: DESYREL Take 1-2 tablets (50-100 mg total) by mouth at bedtime as needed for sleep. Started by: Felix Pacini   Vitamin D (Ergocalciferol) 1.25 MG (50000 UNIT) Caps capsule Commonly known as: DRISDOL Take 1 capsule (50,000 Units total) by mouth every 7 (seven) days.   vitamin D3 50 MCG (2000 UT) Caps Take 2,000 Units by mouth daily.       All past medical history, surgical history, allergies, family history, immunizations andmedications were updated in the EMR today and reviewed under the history and medication portions of their EMR.       ROS: 14 pt review of systems performed and negative (unless mentioned in an HPI)  Objective: BP 132/82   Pulse 96   Temp 98 F (36.7 C)   Wt (!) 376 lb 6.4 oz (170.7 kg)   SpO2 97%   BMI 47.05 kg/m  Physical Exam Vitals and nursing note reviewed.  Constitutional:      General: He is not in acute distress.    Appearance: Normal appearance. He is not ill-appearing, toxic-appearing or diaphoretic.  HENT:     Head: Normocephalic and atraumatic.  Eyes:     General: No  scleral icterus.       Right eye: No discharge.        Left eye: No discharge.     Extraocular Movements: Extraocular movements intact.     Pupils: Pupils are equal, round, and reactive to light.  Cardiovascular:     Rate and Rhythm: Normal rate and regular rhythm.  Pulmonary:     Effort: Pulmonary effort is normal. No respiratory distress.     Breath sounds: Normal breath sounds. No wheezing, rhonchi or rales.  Musculoskeletal:     Right lower leg: No edema.     Left lower leg: No edema.  Skin:    General: Skin is warm.     Coloration: Skin is not pale.     Findings: No rash.  Neurological:     Mental Status: He is alert and oriented to person, place, and time. Mental status is at baseline.  Psychiatric:        Mood and Affect: Mood normal.        Behavior: Behavior normal.        Thought Content: Thought content normal.        Judgment: Judgment normal.    Diabetic Foot Exam - Simple   Simple Foot Form Diabetic Foot exam was performed with the following findings: Yes 07/02/2023  1:23 PM  Visual Inspection No deformities, no ulcerations, no other skin breakdown bilaterally: Yes Sensation Testing Intact  to touch and monofilament testing bilaterally: Yes Pulse Check Posterior Tibialis and Dorsalis pulse intact bilaterally: Yes Comments      Results for orders placed or performed in visit on 07/02/23 (from the past 48 hours)  POCT HgB A1C     Status: Abnormal   Collection Time: 07/02/23  1:19 PM  Result Value Ref Range   Hemoglobin A1C 7.2 (A) 4.0 - 5.6 %   HbA1c POC (<> result, manual entry) 7.2 4.0 - 5.6 %   HbA1c, POC (prediabetic range) 7.2 (A) 5.7 - 6.4 %   HbA1c, POC (controlled diabetic range) 7.2 (A) 0.0 - 7.0 %      Assessment/plan: Hannah Crill is a 48 y.o. male present for [facility] Lumbar radiculopathy/back pain/obesity Stable Continue Cymbalta 60 mg to twice daily Continue Mobic 15 mg daily with food. - Xray completed 11/2018.Mild multilevel degenerative  disc disease with small anterior osteophytes. No fracture or traumatic malalignment. -Accommodation form for employer completed in the past -Trial of Elavil; was not helpful.    Anxiety with depression Stable Continue cymbalta to 60 mg to twice daily Start trazodone  Essential hypertension, benign/elevated Tg/morbid obesity Above goal of <130/80 -  goal of <130/80.  Increase  lisinopril 30 mg qd. Continue hydrochlorothiazide (he restarted)-  - low salt diet.  Vit d def/hypocalcemia: Supplementing with vit d UTD 09/04/2022-level 37 (was 12) Hypocalcemia labs from ED 6/5-PTH/calcium collected today  Type 2 diabetes mellitus with hyperlipidemia (HCC) A1c 5.9> 6.8>7.1> 7.1>6.4 > 6.4 > 7.2 collected today Discussed sugars will not drop with use of metformin. Encouraged him to supplement with protein breakfast.  - Continue the diet and weight loss Continue metformin to 1000 mg every day. Start glip 5 mg with food Continue to atorvastatin 20 mg daily - Janumet, farxiga, Ozempic and Victoza became too expensive.   PNA series:pna 20 updated today Eye exam up-to-date 08/2022-reminded him of upcoming need Foot exam up-to-date 09/04/2022> referred to podiatry.  Completed foot exam 1/29 2025 again. Microalbumin: UTD 11/2022 GFR UTD 11/2022   Return in about 15 weeks (around 10/15/2023).  Orders Placed This Encounter  Procedures   Pneumococcal conjugate vaccine 20-valent   POCT HgB A1C    Meds ordered this encounter  Medications   metFORMIN (GLUCOPHAGE) 1000 MG tablet    Sig: Take 1 tablet (1,000 mg total) by mouth 2 (two) times daily with a meal.    Dispense:  90 tablet    Refill:  1   meloxicam (MOBIC) 15 MG tablet    Sig: Take 1 tablet (15 mg total) by mouth daily.    Dispense:  90 tablet    Refill:  1   lisinopril (ZESTRIL) 30 MG tablet    Sig: Take 1 tablet (30 mg total) by mouth daily.    Dispense:  90 tablet    Refill:  1   DULoxetine (CYMBALTA) 60 MG capsule    Sig: Take  1 capsule (60 mg total) by mouth 2 (two) times daily.    Dispense:  180 capsule    Refill:  1   atorvastatin (LIPITOR) 20 MG tablet    Sig: Take 1 tablet (20 mg) by mouth daily.    Dispense:  90 tablet    Refill:  3   Vitamin D, Ergocalciferol, (DRISDOL) 1.25 MG (50000 UNIT) CAPS capsule    Sig: Take 1 capsule (50,000 Units total) by mouth every 7 (seven) days.    Dispense:  12 capsule    Refill:  3  traZODone (DESYREL) 50 MG tablet    Sig: Take 1-2 tablets (50-100 mg total) by mouth at bedtime as needed for sleep.    Dispense:  180 tablet    Refill:  1   glipiZIDE (GLUCOTROL) 5 MG tablet    Sig: Take 1 tablet (5 mg total) by mouth daily before breakfast.    Dispense:  90 tablet    Refill:  1   hydrochlorothiazide (HYDRODIURIL) 25 MG tablet    Sig: Take 1 tablet (25 mg total) by mouth daily.    Dispense:  90 tablet    Refill:  1   Referral Orders  No referral(s) requested today      Note is dictated utilizing voice recognition software. Although note has been proof read prior to signing, occasional typographical errors still can be missed. If any questions arise, please do not hesitate to call for verification.  Electronically signed by: Felix Pacini, DO [facility] [place]

## 2023-08-11 ENCOUNTER — Ambulatory Visit: Payer: BC Managed Care – PPO | Admitting: Family Medicine

## 2023-10-07 ENCOUNTER — Telehealth: Payer: Self-pay | Admitting: Family Medicine

## 2023-10-07 NOTE — Telephone Encounter (Signed)
LVM & sent MyChart

## 2023-10-07 NOTE — Telephone Encounter (Signed)
 Please call patient:  Please advise him we need to get him scheduled for his chronic congestion appointment follow-up. We have been advised by the lab that the urinalysis collected in June for his diabetes was not calculated accurately, and he has elevated protein in his urine.  This is typically from uncontrolled diabetes and/for hypertension.  I do not want him to feel too concerned, as the levels were not extremely high, but they were just mildly above what is considered normal.  Therefore we need to get him in for his chronic condition management and we can repeat his urine protein test at that time.

## 2023-12-19 ENCOUNTER — Encounter: Payer: Self-pay | Admitting: Family Medicine

## 2023-12-19 ENCOUNTER — Ambulatory Visit: Admitting: Family Medicine

## 2023-12-19 VITALS — BP 135/80 | HR 87 | Temp 97.9°F | Wt 378.0 lb

## 2023-12-19 DIAGNOSIS — F418 Other specified anxiety disorders: Secondary | ICD-10-CM

## 2023-12-19 DIAGNOSIS — Z7985 Long-term (current) use of injectable non-insulin antidiabetic drugs: Secondary | ICD-10-CM

## 2023-12-19 DIAGNOSIS — E559 Vitamin D deficiency, unspecified: Secondary | ICD-10-CM

## 2023-12-19 DIAGNOSIS — G4733 Obstructive sleep apnea (adult) (pediatric): Secondary | ICD-10-CM | POA: Diagnosis not present

## 2023-12-19 DIAGNOSIS — E1169 Type 2 diabetes mellitus with other specified complication: Secondary | ICD-10-CM

## 2023-12-19 DIAGNOSIS — E785 Hyperlipidemia, unspecified: Secondary | ICD-10-CM | POA: Diagnosis not present

## 2023-12-19 DIAGNOSIS — Z6841 Body Mass Index (BMI) 40.0 and over, adult: Secondary | ICD-10-CM

## 2023-12-19 DIAGNOSIS — I10 Essential (primary) hypertension: Secondary | ICD-10-CM | POA: Diagnosis not present

## 2023-12-19 DIAGNOSIS — E1149 Type 2 diabetes mellitus with other diabetic neurological complication: Secondary | ICD-10-CM

## 2023-12-19 DIAGNOSIS — Z7984 Long term (current) use of oral hypoglycemic drugs: Secondary | ICD-10-CM

## 2023-12-19 MED ORDER — LISINOPRIL 40 MG PO TABS: 40.0000 mg | ORAL_TABLET | Freq: Every day | ORAL | 1 refills | Status: DC

## 2023-12-19 MED ORDER — HYDROCHLOROTHIAZIDE 25 MG PO TABS: 25.0000 mg | ORAL_TABLET | Freq: Every day | ORAL | 1 refills | Status: DC

## 2023-12-19 MED ORDER — TRAZODONE HCL 50 MG PO TABS: 50.0000 mg | ORAL_TABLET | Freq: Every evening | ORAL | 1 refills | Status: DC | PRN

## 2023-12-19 MED ORDER — DULOXETINE HCL 60 MG PO CPEP: 60.0000 mg | ORAL_CAPSULE | Freq: Two times a day (BID) | ORAL | 1 refills | Status: DC

## 2023-12-19 MED ORDER — MELOXICAM 15 MG PO TABS: 15.0000 mg | ORAL_TABLET | Freq: Every day | ORAL | 1 refills | Status: DC

## 2023-12-19 MED ORDER — METFORMIN HCL 1000 MG PO TABS: 1000.0000 mg | ORAL_TABLET | Freq: Two times a day (BID) | ORAL | 1 refills | Status: DC

## 2023-12-19 MED ORDER — VITAMIN D (ERGOCALCIFEROL) 1.25 MG (50000 UNIT) PO CAPS: 50000.0000 [IU] | ORAL_CAPSULE | ORAL | 3 refills | Status: DC

## 2023-12-19 NOTE — Patient Instructions (Addendum)
 Return in about 24 weeks (around 06/04/2024) for Routine chronic condition follow-up.        Great to see you today.  I have refilled the medication(s) we provide.   If labs were collected or images ordered, we will inform you of  results once we have received them and reviewed. We will contact you either by echart message, or telephone call.  Please give ample time to the testing facility, and our office to run,  receive and review results. Please do not call inquiring of results, even if you can see them in your chart. We will contact you as soon as we are able. If it has been over 1 week since the test was completed, and you have not yet heard from us , then please call us .    - echart message- for normal results that have been seen by the patient already.   - telephone call: abnormal results or if patient has not viewed results in their echart.  If a referral to a specialist was entered for you, please call us  in 2 weeks if you have not heard from the specialist office to schedule.

## 2023-12-19 NOTE — Progress Notes (Signed)
 Patient ID: Christopher Ingram, male  DOB: 05-25-1976, 48 y.o.   MRN: [id number] Patient Care Team    Relationship Specialty Notifications Start End  Christopher Ingram LABOR, DO PCP - General Family Medicine  01/03/16   Christopher Oh, MD (Inactive) Consulting Physician Pulmonary Disease  10/11/20   Christopher Ingram, Christopher Ingram., MD Consulting Physician Gastroenterology  02/21/21     Chief Complaint  Patient presents with   Diabetes    Chronic Conditions/illness Management. Wants to discuss starting Wegovy . It is covered by his insurance. Eye exam and colonoscopy completed.    Subjective: Christopher Ingram is a 48 y.o. male present for chronic condition management All past medical history, surgical history, allergies, family history, immunizations, medications and social history were updated in the electronic medical record today. All recent labs, ED visits and hospitalizations within the last year were reviewed.  Depression/anxiety/neuropathy-lumbar pain: Patient reports compliance with cymbalta  60 mg daily  daily.  working well  for his depression, anxiety and low back pain/diabetic neuropathy.    Tired meds in the past: zoloft . Prozac, lyrica  (not covered any longer)  Hypertension/morbid obesity/elevated tg : Pt reports compliance with his HCTZ 25 mg and lisinopril  30 mg. He is also eating a low salt diet.. Patient denies chest pain, shortness of breath, dizziness or lower extremity edema.   He is tolerating statin.  Diet: low sodium, low carb Exercise: routinely RF: HTN, HLD, DM, obesity, FH HD (father MI 66)  Diabetes:  Diagnosed 10/2018 with an a1c of 9.5.  He has chronic neuropathy in which she states the Lyrica  is going well for him. Patient reports compliance with metformin  1000 mg daily.  He stopped taking the glipizide  1 month ago, because he felt he was having low sugars. Patient denies dizziness, hyperglycemic or hypoglycemic events. Patient denies numbness, tingling in the extremities or  nonhealing wounds of feet.   Tried meds: Janumet , Ozempic  and Victoza  all which eventually came too expensive but worked well     12/19/2023    1:37 PM 07/02/2023    1:16 PM 11/15/2022    8:14 AM 09/04/2022    1:12 PM 07/05/2021    8:46 AM  Depression screen PHQ 2/9  Decreased Interest 2 3 0 0 1  Down, Depressed, Hopeless 1 3 0 0 0  PHQ - 2 Score 3 6 0 0 1  Altered sleeping 0 3  0 2  Tired, decreased energy 1 3  0 2  Change in appetite 1 1  0 0  Feeling bad or failure about yourself  0 1  0 0  Trouble concentrating 0 0  0 0  Moving slowly or fidgety/restless 0 0  0 0  Suicidal thoughts 0 0  0 0  PHQ-9 Score 5 14  0 5  Difficult doing work/chores Not difficult at all Very difficult         12/19/2023    1:37 PM 07/02/2023    1:16 PM 07/05/2021    8:46 AM 02/21/2021    9:58 AM  GAD 7 : Generalized Anxiety Score  Nervous, Anxious, on Edge 1  1 1   Control/stop worrying 2 3 0 0  Worry too much - different things 1 3 1  0  Trouble relaxing 0 3 1 0  Restless 0 1 0 0  Easily annoyed or irritable 1 0 2 1  Afraid - awful might happen 1 0 0 0  Total GAD 7 Score 6  5 2   Anxiety Difficulty Not difficult  at all Very difficult             12/19/2023    1:36 PM 07/02/2023    1:15 PM 11/15/2022    8:14 AM 01/24/2016    3:43 PM 01/03/2016    1:51 PM  Fall Risk   Falls in the past year? 0 0 0 No  No   Number falls in past yr: 0  0    Injury with Fall? 0  0    Risk for fall due to : No Fall Risks  No Fall Risks    Follow up Falls evaluation completed Falls evaluation completed Falls evaluation completed       Data saved with a previous flowsheet row definition   Immunization History  Administered Date(s) Administered   Influenza,inj,Quad PF,6+ Mos 05/15/2016, 04/07/2017, 01/26/2019, 02/21/2021, 03/05/2022   Influenza-Unspecified 03/19/2018, 04/01/2020   Moderna Covid-19 Vaccine Bivalent Booster 6yrs & up 05/17/2021   Moderna SARS-COV2 Booster Vaccination 03/05/2022   PFIZER(Purple  Top)SARS-COV-2 Vaccination 08/26/2019, 09/20/2019, 04/25/2020   PNEUMOCOCCAL CONJUGATE-20 07/02/2023   Pneumococcal Polysaccharide-23 01/26/2019   Tdap 10/31/2011, 02/21/2021     Past Medical History:  Diagnosis Date   Allergy    Anterior femoral cutaneous neuropathy of right lower extremity    Anxiety    Asthma    Chicken pox    Depression, major    Diabetes mellitus without complication (HCC)    GERD (gastroesophageal reflux disease)    Hemorrhoids 11/03/2020   Hypertension    Internal hemorrhoids    rectal bleeding, colonoscopy   Migraine    OSA on CPAP 04/2012   AHI23   RAD (reactive airway disease)    ? 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[facility]   Allergies  Allergen Reactions   Aspirin Hives   Past Surgical History:  Procedure Laterality Date   EYE SURGERY     lazy eye   Family History  Problem Relation Age of Onset   Diabetes Mother    Colon polyps Mother    Diabetes Father    Early death Father        heart attack 27   Breast cancer Sister    Liver cancer Sister    Mental illness Brother    Colon cancer Neg Hx    Esophageal cancer Neg Hx    Inflammatory bowel disease Neg Hx    Pancreatic cancer Neg Hx    Rectal cancer Neg Hx    Stomach cancer Neg Hx    Social History   Social History Narrative   Married to Christopher Ingram). Has one child named Christopher Ingram (special needs).    HS grad. Works at [employer] as a Pensions consultant.    Denies tobacco, drugs or etoh.    Drinks caffeine. Takes a daily vitamin.    Wears seatbelt. Smoke detector in the home.    Exercises routinely.    Feels safe in his relationships     Allergies as of 12/19/2023       Reactions   Aspirin Hives        Medication List        Accurate as of December 19, 2023 11:59 PM. If you have any questions, ask your nurse or doctor.          albuterol  108 (90 Base) MCG/ACT inhaler Commonly known  as: VENTOLIN  HFA Inhale 1-2 puffs into the lungs every  6 (six) hours as needed for wheezing or shortness of breath.   atorvastatin  20 MG tablet Commonly known as: LIPITOR Take 1 tablet (20 mg) by mouth daily.   Dexcom G7 Sensor Misc Monitor blood glucose TID   DULoxetine  60 MG capsule Commonly known as: Cymbalta  Take 1 capsule (60 mg total) by mouth 2 (two) times daily.   glipiZIDE  5 MG tablet Commonly known as: GLUCOTROL  Take 1 tablet (5 mg total) by mouth daily before breakfast.   hydrochlorothiazide  25 MG tablet Commonly known as: HYDRODIURIL  Take 1 tablet (25 mg total) by mouth daily.   lisinopril  40 MG tablet Commonly known as: ZESTRIL  Take 1 tablet (40 mg total) by mouth daily. What changed:  medication strength how much to take Changed by: Ingram Bellini   meloxicam  15 MG tablet Commonly known as: MOBIC  Take 1 tablet (15 mg total) by mouth daily.   metFORMIN  1000 MG tablet Commonly known as: GLUCOPHAGE  Take 1 tablet (1,000 mg total) by mouth 2 (two) times daily with a meal.   Ozempic  (0.25 or 0.5 MG/DOSE) 2 MG/3ML Sopn Generic drug: Semaglutide (0.25 or 0.5MG /DOS) Inject 0.25 mg into the skin once a week for 28 days, THEN 0.5 mg once a week for 28 days. Start taking on: December 22, 2023 Started by: Ingram Bellini   traZODone  50 MG tablet Commonly known as: DESYREL  Take 1-2 tablets (50-100 mg total) by mouth at bedtime as needed for sleep.   Vitamin D  (Ergocalciferol ) 1.25 MG (50000 UNIT) Caps capsule Commonly known as: DRISDOL  Take 1 capsule (50,000 Units total) by mouth every 7 (seven) days.   vitamin D3 50 MCG (2000 UT) Caps Take 2,000 Units by mouth daily.       All past medical history, surgical history, allergies, family history, immunizations andmedications were updated in the EMR today and reviewed under the history and medication portions of their EMR.       ROS: 14 pt review of systems performed and negative (unless mentioned in an  HPI)  Objective: BP 135/80   Pulse 87   Temp 97.9 F (36.6 C) (Oral)   Wt (!) 378 lb (171.5 kg)   SpO2 98%   BMI 47.25 kg/m  Physical Exam Vitals and nursing note reviewed.  Constitutional:      General: He is not in acute distress.    Appearance: Normal appearance. He is obese. He is not ill-appearing, toxic-appearing or diaphoretic.  HENT:     Head: Normocephalic and atraumatic.  Eyes:     General: No scleral icterus.       Right eye: No discharge.        Left eye: No discharge.     Extraocular Movements: Extraocular movements intact.     Pupils: Pupils are equal, round, and reactive to light.  Cardiovascular:     Rate and Rhythm: Normal rate and regular rhythm.     Heart sounds: No murmur heard. Pulmonary:     Effort: Pulmonary effort is normal. No respiratory distress.     Breath sounds: Normal breath sounds. No wheezing, rhonchi or rales.  Musculoskeletal:     Cervical back: Neck supple.     Right lower leg: No edema.     Left lower leg: No edema.  Skin:    General: Skin is warm.     Coloration: Skin is not pale.     Findings: No rash.  Neurological:     Mental Status: He is alert and oriented to person, place, and time. Mental  status is at baseline.  Psychiatric:        Mood and Affect: Mood normal.        Behavior: Behavior normal.        Thought Content: Thought content normal.        Judgment: Judgment normal.    Diabetic Foot Exam - Simple   Simple Foot Form Diabetic Foot exam was performed with the following findings: Yes 12/22/2023  3:52 PM  Visual Inspection No deformities, no ulcerations, no other skin breakdown bilaterally: Yes Sensation Testing Intact to touch and monofilament testing bilaterally: Yes Pulse Check Posterior Tibialis and Dorsalis pulse intact bilaterally: Yes Comments     No results found for this or any previous visit (from the past 48 hours).   Assessment/plan: Caydyn Sprung is a 48 y.o. male present for chronic condition  management Lumbar radiculopathy/back pain/obesity Stable Continue Cymbalta  60 mg to twice daily Continue Mobic  15 mg daily with food. - Xray completed 11/2018.Mild multilevel degenerative disc disease with small anterior osteophytes. No fracture or traumatic malalignment. -Accommodation form for employer completed in the past -Trial of Elavil ; was not helpful.    Anxiety with depression Stable Continue cymbalta  to 60 mg to twice daily Start trazodone   Essential hypertension, benign/elevated Tg/morbid obesity Above goal of <130/80 -  goal of <130/80.  Increase  lisinopril  30 mg qd.  To 40 mg daily Continue hydrochlorothiazide  (he restarted)-  - low salt diet.  Vit d def/hypocalcemia: Supplementing with vit d UTD 09/04/2022-level 37 (was 12) Hypocalcemia labs from ED 6/5-PTH/calcium  collected today  Type 2 diabetes mellitus with hyperlipidemia (HCC) A1c 5.9> 6.8>7.1> 7.1>6.4 > 6.4 > 7.2 >collected today Discussed sugars will not drop with use of metformin . Encouraged him to supplement with protein breakfast.  - Continue the diet and weight loss Continue metformin  to 1000 mg every day. Continue glip 5 mg with food as she discontinued this medicine 1 month ago.  Reports of lower in sugars.  He would like to start GLP, which she states is now on his formulary  -Called in Ozempic  tapering for him to start Continue to atorvastatin  20 mg daily - Janumet , farxiga , Ozempic  and Victoza  became too expensive.   PNA series:pna 20 completed Eye exam up-to-date 08/2022-reminded him of upcoming need Foot exam up-to-date 09/04/2022> referred to podiatry.  Completed today 12/19/2023  microalbumin: Collected today 12/19/2023 GFR collected today 12/19/2023   Return in about 24 weeks (around 06/04/2024) for Routine chronic condition follow-up.  Orders Placed This Encounter  Procedures   Comp Met (CMET)   Hemoglobin A1c   Urine Microalbumin w/creat. ratio   CBC   TSH   Lipid panel   PTH, Intact and  Calcium    Vitamin D  (25 hydroxy)    Meds ordered this encounter  Medications   DULoxetine  (CYMBALTA ) 60 MG capsule    Sig: Take 1 capsule (60 mg total) by mouth 2 (two) times daily.    Dispense:  180 capsule    Refill:  1   hydrochlorothiazide  (HYDRODIURIL ) 25 MG tablet    Sig: Take 1 tablet (25 mg total) by mouth daily.    Dispense:  90 tablet    Refill:  1   lisinopril  (ZESTRIL ) 40 MG tablet    Sig: Take 1 tablet (40 mg total) by mouth daily.    Dispense:  90 tablet    Refill:  1   meloxicam  (MOBIC ) 15 MG tablet    Sig: Take 1 tablet (15 mg total) by mouth daily.  Dispense:  90 tablet    Refill:  1   metFORMIN  (GLUCOPHAGE ) 1000 MG tablet    Sig: Take 1 tablet (1,000 mg total) by mouth 2 (two) times daily with a meal.    Dispense:  180 tablet    Refill:  1   traZODone  (DESYREL ) 50 MG tablet    Sig: Take 1-2 tablets (50-100 mg total) by mouth at bedtime as needed for sleep.    Dispense:  180 tablet    Refill:  1   Vitamin D , Ergocalciferol , (DRISDOL ) 1.25 MG (50000 UNIT) CAPS capsule    Sig: Take 1 capsule (50,000 Units total) by mouth every 7 (seven) days.    Dispense:  12 capsule    Refill:  3   OZEMPIC , 0.25 OR 0.5 MG/DOSE, 2 MG/3ML SOPN    Sig: Inject 0.25 mg into the skin once a week for 28 days, THEN 0.5 mg once a week for 28 days.    Dispense:  3 mL    Refill:  1   Referral Orders  No referral(s) requested today      Note is dictated utilizing voice recognition software. Although note has been proof read prior to signing, occasional typographical errors still can be missed. If any questions arise, please do not hesitate to call for verification.  Electronically signed by: Ingram Bellini, DO [facility] [place]

## 2023-12-20 LAB — COMPREHENSIVE METABOLIC PANEL WITH GFR
AG Ratio: 1.5 (calc) (ref 1.0–2.5)
ALT: 18 U/L (ref 9–46)
AST: 16 U/L (ref 10–40)
Albumin: 4.5 g/dL (ref 3.6–5.1)
Alkaline phosphatase (APISO): 49 U/L (ref 36–130)
BUN: 19 mg/dL (ref 7–25)
CO2: 27 mmol/L (ref 20–32)
Calcium: 10.4 mg/dL — ABNORMAL HIGH (ref 8.6–10.3)
Chloride: 99 mmol/L (ref 98–110)
Creat: 0.96 mg/dL (ref 0.60–1.29)
Globulin: 3.1 g/dL (ref 1.9–3.7)
Glucose, Bld: 95 mg/dL (ref 65–99)
Potassium: 3.9 mmol/L (ref 3.5–5.3)
Sodium: 138 mmol/L (ref 135–146)
Total Bilirubin: 0.6 mg/dL (ref 0.2–1.2)
Total Protein: 7.6 g/dL (ref 6.1–8.1)
eGFR: 97 mL/min/1.73m2 (ref >=60)

## 2023-12-20 LAB — LIPID PANEL
Cholesterol: 160 mg/dL (ref <200)
HDL: 48 mg/dL (ref >=40)
LDL Cholesterol (Calc): 86 mg/dL
Non-HDL Cholesterol (Calc): 112 mg/dL (ref <130)
Total CHOL/HDL Ratio: 3.3 (calc) (ref <5.0)
Triglycerides: 166 mg/dL — ABNORMAL HIGH (ref <150)

## 2023-12-20 LAB — PTH, INTACT AND CALCIUM
Calcium: 10.4 mg/dL — ABNORMAL HIGH (ref 8.6–10.3)
PTH: 23 pg/mL (ref 16–77)

## 2023-12-20 LAB — CBC
HCT: 44.9 % (ref 38.5–50.0)
Hemoglobin: 15 g/dL (ref 13.2–17.1)
MCH: 28.6 pg (ref 27.0–33.0)
MCHC: 33.4 g/dL (ref 32.0–36.0)
MCV: 85.7 fL (ref 80.0–100.0)
MPV: 11.3 fL (ref 7.5–12.5)
Platelets: 364 Thousand/uL (ref 140–400)
RBC: 5.24 Million/uL (ref 4.20–5.80)
RDW: 13.3 % (ref 11.0–15.0)
WBC: 8 Thousand/uL (ref 3.8–10.8)

## 2023-12-20 LAB — TSH: TSH: 1.26 m[IU]/L (ref 0.40–4.50)

## 2023-12-20 LAB — HEMOGLOBIN A1C
Hgb A1c MFr Bld: 7.1 % — ABNORMAL HIGH (ref <5.7)
Mean Plasma Glucose: 157 mg/dL
eAG (mmol/L): 8.7 mmol/L

## 2023-12-20 LAB — MICROALBUMIN / CREATININE URINE RATIO
Creatinine, Urine: 150 mg/dL (ref 20–320)
Microalb Creat Ratio: 83 mg/g{creat} — ABNORMAL HIGH (ref <30)
Microalb, Ur: 12.5 mg/dL

## 2023-12-20 LAB — VITAMIN D 25 HYDROXY (VIT D DEFICIENCY, FRACTURES): Vit D, 25-Hydroxy: 47 ng/mL (ref 30–100)

## 2023-12-22 ENCOUNTER — Ambulatory Visit: Admitting: Family Medicine

## 2023-12-22 ENCOUNTER — Ambulatory Visit: Payer: Self-pay | Admitting: Family Medicine

## 2023-12-22 MED ORDER — OZEMPIC (0.25 OR 0.5 MG/DOSE) 2 MG/3ML ~~LOC~~ SOPN: PEN_INJECTOR | SUBCUTANEOUS | 1 refills | Status: DC

## 2023-12-22 NOTE — Telephone Encounter (Signed)
 Please call patient  His calcium  levels are mildly elevated-encouraged him to increase his hydration and avoid taking any additional calcium  supplements. His urine protein levels are elevated.  This is from inadequately controlled diabetes and/your blood pressure.  We did increase his blood pressure regimen this past visit so hopefully that will help. His A1c is elevated to 7.1.  He needs more coverage for his diabetes.  He had reported stopping the glipizide  and that is okay only if we are able to get the Ozempic  covered that was also called in for him today.  He is to start an Ozempic  taper of 0.25 mg weekly injection x 4 weeks, then increase to Ozempic  0.5 mg injection for 4 weeks.  We will need to see him back in 6 weeks after starting the injections so that we can hopefully taper him up to the next steps.  If Ozempic  is not covered for him, then we will need him to restart the glipizide  back.

## 2023-12-24 ENCOUNTER — Telehealth: Payer: Self-pay

## 2023-12-24 ENCOUNTER — Encounter: Payer: Self-pay | Admitting: Family Medicine

## 2023-12-24 NOTE — Telephone Encounter (Signed)
 Pt came into office with mother for appt. 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 PCP made aware and will advise pt that summons is needed to complete letter

## 2023-12-24 NOTE — Telephone Encounter (Signed)
 Jury duty excuse provided and printed placed in CMA work [employer]

## 2023-12-25 NOTE — Telephone Encounter (Signed)
 No further action needed.

## 2024-01-14 ENCOUNTER — Ambulatory Visit: Admitting: Podiatry

## 2024-01-21 ENCOUNTER — Ambulatory Visit (INDEPENDENT_AMBULATORY_CARE_PROVIDER_SITE_OTHER): Admitting: Podiatry

## 2024-01-21 ENCOUNTER — Encounter: Payer: Self-pay | Admitting: Podiatry

## 2024-01-21 DIAGNOSIS — L603 Nail dystrophy: Secondary | ICD-10-CM

## 2024-01-21 NOTE — Progress Notes (Signed)
 Subjective:  Patient ID: Christopher Ingram, male    DOB: 1976-05-17,  MRN: [id number]  Chief Complaint  Patient presents with   Nail Problem    Right hallux nail/toe pain  Pt stated that he only has the pain when pressure is applied to his toe     48 y.o. male presents with the above complaint.  Patient presents with right hallux thickened and [place] dystrophic mycotic toenail x 1.  She wanted to have it removed is been causing a lot of discomfort the whole nail hurts worse with ambulation shoe pressure hurts with tighter shoes.  He is dystrophic ptotic.  Pain scale 7 out of 10 dull aching nature   Review of Systems: Negative except as noted in the HPI. Denies N/V/F/Ch.  Past Medical History:  Diagnosis Date   Allergy    Anterior femoral cutaneous neuropathy of right lower extremity    Anxiety    Asthma    Chicken pox    Depression, major    Diabetes mellitus without complication (HCC)    GERD (gastroesophageal reflux disease)    Hemorrhoids 11/03/2020   Hypertension    Internal hemorrhoids    rectal bleeding, colonoscopy   Migraine    OSA on CPAP 04/2012   AHI23   RAD (reactive airway disease)    ? 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[facility]    Current Outpatient Medications:    albuterol  (VENTOLIN  HFA) 108 (90 Base) MCG/ACT inhaler, Inhale 1-2 puffs into the lungs every 6 (six) hours as needed for wheezing or shortness of breath., Disp: 1 each, Rfl: 0   atorvastatin  (LIPITOR) 20 MG tablet, Take 1 tablet (20 mg) by mouth daily., Disp: 90 tablet, Rfl: 3   Cholecalciferol (VITAMIN D3) 50 MCG (2000 UT) CAPS, Take 2,000 Units by mouth daily., Disp: , Rfl:    Continuous Blood Gluc Sensor (DEXCOM G7 SENSOR) MISC, Monitor blood glucose TID, Disp: 3 each, Rfl: 11   DULoxetine  (CYMBALTA ) 60 MG capsule, Take 1 capsule (60 mg total) by mouth 2 (two) times daily., Disp: 180 capsule, Rfl: 1   glipiZIDE   (GLUCOTROL ) 5 MG tablet, Take 1 tablet (5 mg total) by mouth daily before breakfast., Disp: 90 tablet, Rfl: 1   hydrochlorothiazide  (HYDRODIURIL ) 25 MG tablet, Take 1 tablet (25 mg total) by mouth daily., Disp: 90 tablet, Rfl: 1   lisinopril  (ZESTRIL ) 40 MG tablet, Take 1 tablet (40 mg total) by mouth daily., Disp: 90 tablet, Rfl: 1   meloxicam  (MOBIC ) 15 MG tablet, Take 1 tablet (15 mg total) by mouth daily., Disp: 90 tablet, Rfl: 1   metFORMIN  (GLUCOPHAGE ) 1000 MG tablet, Take 1 tablet (1,000 mg total) by mouth 2 (two) times daily with a meal., Disp: 180 tablet, Rfl: 1   OZEMPIC , 0.25 OR 0.5 MG/DOSE, 2 MG/3ML SOPN, Inject 0.25 mg into the skin once a week for 28 days, THEN 0.5 mg once a week for 28 days., Disp: 3 mL, Rfl: 1   traZODone  (DESYREL ) 50 MG tablet, Take 1-2 tablets (50-100 mg total) by mouth at bedtime as needed for sleep., Disp: 180 tablet, Rfl: 1   Vitamin D , Ergocalciferol , (DRISDOL ) 1.25 MG (50000 UNIT) CAPS capsule, Take 1 capsule (50,000 Units total) by mouth every 7 (seven) days., Disp: 12 capsule, Rfl: 3  Social History   Tobacco Use  Smoking Status Never  Smokeless Tobacco Never    Allergies  Allergen Reactions   Aspirin Hives   Objective:  There were no vitals filed for this visit. There is no height or weight on file to calculate BMI. Constitutional Well developed. Well nourished.  Vascular Dorsalis pedis pulses palpable bilaterally. Posterior tibial pulses palpable bilaterally. Capillary refill normal to all digits.  No cyanosis or clubbing noted. Pedal hair growth normal.  Neurologic Normal speech. Oriented to person, place, and time. Epicritic sensation to light touch grossly present bilaterally.  Dermatologic Pain on palpation of the entire/total nail on 1st digit of the right No other open wounds. No skin lesions.  Orthopedic: Normal joint ROM without pain or crepitus bilaterally. No visible deformities. No bony tenderness.   Radiographs:  None Assessment:   1. Nail dystrophy    Plan:  Patient was evaluated and treated and all questions answered.  Nail contusion/dystrophy hallux, right -Patient elects to proceed with minor surgery to remove entire toenail today. Consent reviewed and signed by patient. -Entire/total nail excised. See procedure note. -Educated on post-procedure care including soaking. Written instructions provided and reviewed. -Patient to follow up in 2 weeks for nail check.  Procedure: Excision of entire/total nail  Location: Right 1st toe digit Anesthesia: Lidocaine 1% plain; 1.5 mL and Marcaine 0.5% plain; 1.5 mL, digital block. Skin Prep: Betadine. Dressing: Silvadene; telfa; dry, sterile, compression dressing. Technique: Following skin prep, the toe was exsanguinated and a tourniquet was secured at the base of the toe. The affected nail border was freed and excised. The tourniquet was then removed and sterile dressing applied. Disposition: Patient tolerated procedure well. Patient to return in 2 weeks for follow-up.   No follow-ups on file.

## 2024-01-21 NOTE — Patient Instructions (Signed)

## 2024-03-31 ENCOUNTER — Ambulatory Visit: Admitting: Family Medicine

## 2024-04-02 ENCOUNTER — Ambulatory Visit (INDEPENDENT_AMBULATORY_CARE_PROVIDER_SITE_OTHER): Admitting: Family Medicine

## 2024-04-02 ENCOUNTER — Encounter: Payer: Self-pay | Admitting: Family Medicine

## 2024-04-02 VITALS — BP 134/84 | HR 89 | Temp 98.4°F | Wt 380.0 lb

## 2024-04-02 DIAGNOSIS — F418 Other specified anxiety disorders: Secondary | ICD-10-CM | POA: Diagnosis not present

## 2024-04-02 MED ORDER — ESCITALOPRAM OXALATE 10 MG PO TABS: 10.0000 mg | ORAL_TABLET | Freq: Every day | ORAL | 1 refills | Status: DC

## 2024-04-02 NOTE — Progress Notes (Signed)
 Christopher Ingram , Aug 24, 1975, 48 y.o., male MRN: [id number] Patient Care Team    Relationship Specialty Notifications Start End  Catherine Charlies LABOR, DO PCP - General Family Medicine  01/03/16   Shellia Oh, MD Consulting Physician Pulmonary Disease  10/11/20   [name], Aloha Raddle., MD Consulting Physician Gastroenterology  02/21/21     Chief Complaint  Patient presents with   Depression    Pt wanting to discuss current medication; states it is not helping like it should.      Subjective: Christopher Ingram is a 48 y.o. Pt presents for an OV with complaints of worsening depression.    Depression/anxiety/neuropathy-lumbar pain: Patient reports compliance with cymbalta  60 mg BID.  Working well  for his  low back pain/diabetic neuropathy, but he does not feel it is working as well for his depression.  He had been on Zoloft  up to 100 mg in the past and it was discontinued secondary to patient's preference and feeling better.  He would like to try different medicine than Zoloft  or Prozac if possible.    Tired meds in the past: zoloft . Prozac, lyrica  (not covered any longer)     04/02/2024    9:15 AM 12/19/2023    1:37 PM 07/02/2023    1:16 PM 11/15/2022    8:14 AM 09/04/2022    1:12 PM  Depression screen PHQ 2/9  Decreased Interest 2 2 3  0 0  Down, Depressed, Hopeless 1 1 3  0 0  PHQ - 2 Score 3 3 6  0 0  Altered sleeping 1 0 3  0  Tired, decreased energy 0 1 3  0  Change in appetite 0 1 1  0  Feeling bad or failure about yourself  1 0 1  0  Trouble concentrating 0 0 0  0  Moving slowly or fidgety/restless 0 0 0  0  Suicidal thoughts 0 0 0  0  PHQ-9 Score 5 5 14   0  Difficult doing work/chores Somewhat difficult Not difficult at all Very difficult      Allergies  Allergen Reactions   Aspirin Hives   Social History   Social History Narrative   Married to Julitmar Wilton). Has one child named Wilder (special needs).    HS grad. Works at [employer] as a pensions consultant.    Denies tobacco,  drugs or etoh.    Drinks caffeine. Takes a daily vitamin.    Wears seatbelt. Smoke detector in the home.    Exercises routinely.    Feels safe in his relationships    Past Medical History:  Diagnosis Date   Allergy    Anterior femoral cutaneous neuropathy of right lower extremity    Anxiety    Asthma    Chicken pox    Depression, major    Diabetes mellitus without complication (HCC)    GERD (gastroesophageal reflux disease)    Hemorrhoids 11/03/2020   Hypertension    Internal hemorrhoids    rectal bleeding, colonoscopy   Migraine    OSA on CPAP 04/2012   AHI23   RAD (reactive airway disease)    ? 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[employer]   Past Surgical History:  Procedure Laterality Date   EYE SURGERY     lazy eye   Family History  Problem Relation Age of Onset   Diabetes Mother    Colon  polyps Mother    Diabetes Father    Early death Father        heart attack 72   Breast cancer Sister    Liver cancer Sister    Mental illness Brother    Colon cancer Neg Hx    Esophageal cancer Neg Hx    Inflammatory bowel disease Neg Hx    Pancreatic cancer Neg Hx    Rectal cancer Neg Hx    Stomach cancer Neg Hx    Allergies as of 04/02/2024       Reactions   Aspirin Hives        Medication List        Accurate as of April 02, 2024 10:29 AM. If you have any questions, ask your nurse or doctor.          albuterol  108 (90 Base) MCG/ACT inhaler Commonly known as: VENTOLIN  HFA Inhale 1-2 puffs into the lungs every 6 (six) hours as needed for wheezing or shortness of breath.   atorvastatin  20 MG tablet Commonly known as: LIPITOR Take 1 tablet (20 mg) by mouth daily.   Dexcom G7 Sensor Misc Monitor blood glucose TID   DULoxetine  60 MG capsule Commonly known as: Cymbalta  Take 1 capsule (60 mg total) by mouth 2 (two) times daily.   escitalopram 10 MG tablet Commonly  known as: Lexapro Take 1 tablet (10 mg total) by mouth daily. Started by: Adell Koval   glipiZIDE  5 MG tablet Commonly known as: GLUCOTROL  Take 1 tablet (5 mg total) by mouth daily before breakfast.   hydrochlorothiazide  25 MG tablet Commonly known as: HYDRODIURIL  Take 1 tablet (25 mg total) by mouth daily.   lisinopril  40 MG tablet Commonly known as: ZESTRIL  Take 1 tablet (40 mg total) by mouth daily.   meloxicam  15 MG tablet Commonly known as: MOBIC  Take 1 tablet (15 mg total) by mouth daily.   metFORMIN  1000 MG tablet Commonly known as: GLUCOPHAGE  Take 1 tablet (1,000 mg total) by mouth 2 (two) times daily with a meal.   Ozempic  (0.25 or 0.5 MG/DOSE) 2 MG/3ML Sopn Generic drug: Semaglutide (0.25 or 0.5MG /DOS) Inject 0.25 mg into the skin once a week for 28 days, THEN 0.5 mg once a week for 28 days. Start taking on: December 22, 2023   traZODone  50 MG tablet Commonly known as: DESYREL  Take 1-2 tablets (50-100 mg total) by mouth at bedtime as needed for sleep.   Vitamin D  (Ergocalciferol ) 1.25 MG (50000 UNIT) Caps capsule Commonly known as: DRISDOL  Take 1 capsule (50,000 Units total) by mouth every 7 (seven) days.   vitamin D3 50 MCG (2000 UT) Caps Take 2,000 Units by mouth daily.        All past medical history, surgical history, allergies, family history, immunizations andmedications were updated in the EMR today and reviewed under the history and medication portions of their EMR.     ROS Negative, with the exception of above mentioned in HPI   Objective:  BP 134/84   Pulse 89   Temp 98.4 F (36.9 C)   Wt (!) 380 lb (172.4 kg)   SpO2 97%   BMI 47.50 kg/m  Body mass index is 47.5 kg/m. Physical Exam Vitals and nursing note reviewed. Exam conducted with a chaperone present.  Constitutional:      General: He is not in acute distress.    Appearance: Normal appearance. He is not ill-appearing, toxic-appearing or diaphoretic.  HENT:     Head: Normocephalic  and atraumatic.  Eyes:     General: No scleral icterus.       Right eye: No discharge.        Left eye: No discharge.     Extraocular Movements: Extraocular movements intact.     Pupils: Pupils are equal, round, and reactive to light.  Skin:    General: Skin is warm and dry.     Coloration: Skin is not jaundiced or pale.     Findings: No rash.  Neurological:     Mental Status: He is alert and oriented to person, place, and time. Mental status is at baseline.  Psychiatric:        Attention and Perception: Attention and perception normal.        Mood and Affect: Mood normal.        Speech: Speech normal.        Behavior: Behavior normal. Behavior is cooperative.        Thought Content: Thought content normal. Thought content does not include homicidal or suicidal ideation. Thought content does not include homicidal or suicidal plan.        Cognition and Memory: Cognition normal.        Judgment: Judgment normal.     No results found. No results found. No results found for this or any previous visit (from the past 24 hours).  Assessment/Plan: Christopher Ingram is a 48 y.o. male present for OV for  Depression with anxiety: Worsening signs of depression for patient. Continue Cymbalta  60 mg twice daily Added escitalopram 10 mg daily Follow-up in January CPE/chronic conditions  Reviewed expectations re: course of current medical issues. Discussed self-management of symptoms. Outlined signs and symptoms indicating need for more acute intervention. Patient verbalized understanding and all questions were answered. Patient received an After-Visit Summary.   Return in about 2 months (around 06/10/2024) for cpe (20 min), Routine chronic condition follow-up.  No orders of the defined types were placed in this encounter.  Meds ordered this encounter  Medications   escitalopram (LEXAPRO) 10 MG tablet    Sig: Take 1 tablet (10 mg total) by mouth daily.    Dispense:  90 tablet    Refill:   1   Referral Orders  No referral(s) requested today     Note is dictated utilizing voice recognition software. Although note has been proof read prior to signing, occasional typographical errors still can be missed. If any questions arise, please do not hesitate to call for verification.   electronically signed by:  Charlies Bellini, DO  [facility] [place]

## 2024-04-02 NOTE — Patient Instructions (Addendum)

## 2024-04-12 ENCOUNTER — Other Ambulatory Visit: Payer: Self-pay

## 2024-04-12 ENCOUNTER — Emergency Department (HOSPITAL_COMMUNITY)
Admission: EM | Admit: 2024-04-12 | Discharge: 2024-04-12 | Disposition: A | Discharge status: Home / Self Care | Attending: Emergency Medicine | Admitting: Emergency Medicine

## 2024-04-12 ENCOUNTER — Emergency Department (HOSPITAL_COMMUNITY)

## 2024-04-12 ENCOUNTER — Encounter (HOSPITAL_COMMUNITY): Payer: Self-pay | Admitting: Emergency Medicine

## 2024-04-12 DIAGNOSIS — R002 Palpitations: Secondary | ICD-10-CM | POA: Diagnosis not present

## 2024-04-12 DIAGNOSIS — R0602 Shortness of breath: Secondary | ICD-10-CM | POA: Diagnosis not present

## 2024-04-12 DIAGNOSIS — I10 Essential (primary) hypertension: Secondary | ICD-10-CM | POA: Insufficient documentation

## 2024-04-12 DIAGNOSIS — Z7984 Long term (current) use of oral hypoglycemic drugs: Secondary | ICD-10-CM | POA: Insufficient documentation

## 2024-04-12 DIAGNOSIS — J45909 Unspecified asthma, uncomplicated: Secondary | ICD-10-CM | POA: Diagnosis not present

## 2024-04-12 DIAGNOSIS — Z79899 Other long term (current) drug therapy: Secondary | ICD-10-CM | POA: Diagnosis not present

## 2024-04-12 DIAGNOSIS — E119 Type 2 diabetes mellitus without complications: Secondary | ICD-10-CM | POA: Insufficient documentation

## 2024-04-12 LAB — COMPREHENSIVE METABOLIC PANEL WITH GFR
ALT: 26 U/L (ref 0–44)
AST: 26 U/L (ref 15–41)
Albumin: 3.9 g/dL (ref 3.5–5.0)
Alkaline Phosphatase: 45 U/L (ref 38–126)
Anion gap: 16 — ABNORMAL HIGH (ref 5–15)
BUN: 19 mg/dL (ref 6–20)
CO2: 19 mmol/L — ABNORMAL LOW (ref 22–32)
Calcium: 9.3 mg/dL (ref 8.9–10.3)
Chloride: 105 mmol/L (ref 98–111)
Creatinine, Ser: 0.96 mg/dL (ref 0.61–1.24)
GFR, Estimated: >60 mL/min (ref >60)
Glucose, Bld: 89 mg/dL (ref 70–99)
Potassium: 3.6 mmol/L (ref 3.5–5.1)
Sodium: 140 mmol/L (ref 135–145)
Total Bilirubin: 0.2 mg/dL (ref 0.0–1.2)
Total Protein: 7.2 g/dL (ref 6.5–8.1)

## 2024-04-12 LAB — CBC WITH DIFFERENTIAL/PLATELET
Abs Immature Granulocytes: 0.04 K/uL (ref 0.00–0.07)
Basophils Absolute: 0.1 K/uL (ref 0.0–0.1)
Basophils Relative: 1 %
Eosinophils Absolute: 0.4 K/uL (ref 0.0–0.5)
Eosinophils Relative: 3 %
HCT: 43.5 % (ref 39.0–52.0)
Hemoglobin: 14.2 g/dL (ref 13.0–17.0)
Immature Granulocytes: 0 %
Lymphocytes Relative: 20 %
Lymphs Abs: 2.2 K/uL (ref 0.7–4.0)
MCH: 28 pg (ref 26.0–34.0)
MCHC: 32.6 g/dL (ref 30.0–36.0)
MCV: 85.8 fL (ref 80.0–100.0)
Monocytes Absolute: 1 K/uL (ref 0.1–1.0)
Monocytes Relative: 9 %
Neutro Abs: 7.4 K/uL (ref 1.7–7.7)
Neutrophils Relative %: 67 %
Platelets: 363 K/uL (ref 150–400)
RBC: 5.07 MIL/uL (ref 4.22–5.81)
RDW: 14 % (ref 11.5–15.5)
WBC: 11 K/uL — ABNORMAL HIGH (ref 4.0–10.5)
nRBC: 0 % (ref 0.0–0.2)

## 2024-04-12 LAB — I-STAT CHEM 8, ED
BUN: 21 mg/dL — ABNORMAL HIGH (ref 6–20)
Calcium, Ion: 1.15 mmol/L (ref 1.15–1.40)
Chloride: 104 mmol/L (ref 98–111)
Creatinine, Ser: 1 mg/dL (ref 0.61–1.24)
Glucose, Bld: 90 mg/dL (ref 70–99)
HCT: 42 % (ref 39.0–52.0)
Hemoglobin: 14.3 g/dL (ref 13.0–17.0)
Potassium: 3.5 mmol/L (ref 3.5–5.1)
Sodium: 140 mmol/L (ref 135–145)
TCO2: 23 mmol/L (ref 22–32)

## 2024-04-12 LAB — D-DIMER, QUANTITATIVE: D-Dimer, Quant: 0.43 ug{FEU}/mL (ref 0.00–0.50)

## 2024-04-12 LAB — TROPONIN I (HIGH SENSITIVITY)
Troponin I (High Sensitivity): 5 ng/L (ref <18)
Troponin I (High Sensitivity): 5 ng/L (ref <18)

## 2024-04-12 LAB — MAGNESIUM: Magnesium: 2.1 mg/dL (ref 1.7–2.4)

## 2024-04-12 NOTE — ED Triage Notes (Signed)
 Patient BIB [facility] from work due to palpitation concerns,124 HR recoccuring @ rest. With periods of dizziness, no chest pain or other symptoms.  BP 154/100, CBG 160

## 2024-04-12 NOTE — ED Provider Notes (Signed)
 [place] EMERGENCY DEPARTMENT AT [facility] Provider Note   CSN: [id number] Arrival date & time: 04/12/24  1528     Patient presents with: Palpitations   Chritopher Coster is a 48 y.o. male.   Patient is a 48 year old male with past medical history of OSA on CPAP, anxiety, asthma, hypertension, and diabetes presenting for complaints of palpitations.  Patient admits to palpations that started this morning and are still ongoing.  He denies any chest pain, shortness of breath, back pain, neck or arm pain.  Nuys any URI symptoms.  He denies any lower extremity swelling or orthopnea.  States he is wearing his CPAP as instructed nightly.  Has not missed any of his hypertensive medications.  Denies history of DVT, PE, unilateral leg swelling, calf pain.  Has had a change in his anxiety and depression medications.  He typically takes duloxetine  daily.  1 week ago Lexapro was added.  Not particularly feel more anxious than normal at this time.  The history is provided by the patient. No language interpreter was used.       Prior to Admission medications   Medication Sig Start Date End Date Taking? Authorizing Provider  albuterol  (VENTOLIN  HFA) 108 (90 Base) MCG/ACT inhaler Inhale 1-2 puffs into the lungs every 6 (six) hours as needed for wheezing or shortness of breath. 11/01/22   Mayer, Jodi R, NP  atorvastatin  (LIPITOR) 20 MG tablet Take 1 tablet (20 mg) by mouth daily. 07/02/23   Kuneff, Renee A, DO  Cholecalciferol (VITAMIN D3) 50 MCG (2000 UT) CAPS Take 2,000 Units by mouth daily.    [provider]  Continuous Blood Gluc Sensor (DEXCOM G7 SENSOR) MISC Monitor blood glucose TID 09/06/22   Kuneff, Renee A, DO  DULoxetine  (CYMBALTA ) 60 MG capsule Take 1 capsule (60 mg total) by mouth 2 (two) times daily. 12/19/23   Kuneff, Renee A, DO  escitalopram (LEXAPRO) 10 MG tablet Take 1 tablet (10 mg total) by mouth daily. 04/02/24   Kuneff, Renee A, DO  glipiZIDE  (GLUCOTROL ) 5 MG tablet  Take 1 tablet (5 mg total) by mouth daily before breakfast. 07/02/23   Kuneff, Renee A, DO  hydrochlorothiazide  (HYDRODIURIL ) 25 MG tablet Take 1 tablet (25 mg total) by mouth daily. 12/19/23   Kuneff, Renee A, DO  lisinopril  (ZESTRIL ) 40 MG tablet Take 1 tablet (40 mg total) by mouth daily. 12/19/23   Kuneff, Renee A, DO  meloxicam  (MOBIC ) 15 MG tablet Take 1 tablet (15 mg total) by mouth daily. 12/19/23   Kuneff, Renee A, DO  metFORMIN  (GLUCOPHAGE ) 1000 MG tablet Take 1 tablet (1,000 mg total) by mouth 2 (two) times daily with a meal. 12/19/23   Kuneff, Renee A, DO  OZEMPIC , 0.25 OR 0.5 MG/DOSE, 2 MG/3ML SOPN Inject 0.25 mg into the skin once a week for 28 days, THEN 0.5 mg once a week for 28 days. 12/22/23 04/02/24  Kuneff, Renee A, DO  traZODone  (DESYREL ) 50 MG tablet Take 1-2 tablets (50-100 mg total) by mouth at bedtime as needed for sleep. 12/19/23   Kuneff, Renee A, DO  Vitamin D , Ergocalciferol , (DRISDOL ) 1.25 MG (50000 UNIT) CAPS capsule Take 1 capsule (50,000 Units total) by mouth every 7 (seven) days. 12/19/23   Kuneff, Renee A, DO    Allergies: Aspirin    Review of Systems  Constitutional:  Negative for chills and fever.  HENT:  Negative for ear pain and sore throat.   Eyes:  Negative for pain and visual disturbance.  Respiratory:  Negative for cough and shortness of breath.   Cardiovascular:  Positive for palpitations. Negative for chest pain.  Gastrointestinal:  Negative for abdominal pain and vomiting.  Genitourinary:  Negative for dysuria and hematuria.  Musculoskeletal:  Negative for arthralgias and back pain.  Skin:  Negative for color change and rash.  Neurological:  Negative for seizures and syncope.  All other systems reviewed and are negative.   Updated Vital Signs BP (!) 145/72 (BP Location: Right Arm)   Pulse 91   Temp 97.7 F (36.5 C) (Oral)   Resp 18   SpO2 96%   Physical Exam Vitals and nursing note reviewed.  Constitutional:      General: He is not in acute  distress.    Appearance: He is well-developed.  HENT:     Head: Normocephalic and atraumatic.  Eyes:     Conjunctiva/sclera: Conjunctivae normal.  Cardiovascular:     Rate and Rhythm: Normal rate and regular rhythm.     Heart sounds: No murmur heard. Pulmonary:     Effort: Pulmonary effort is normal. No respiratory distress.     Breath sounds: Normal breath sounds.  Abdominal:     Palpations: Abdomen is soft.     Tenderness: There is no abdominal tenderness.  Musculoskeletal:        General: No swelling.     Cervical back: Neck supple.  Skin:    General: Skin is warm and dry.     Capillary Refill: Capillary refill takes less than 2 seconds.  Neurological:     Mental Status: He is alert.  Psychiatric:        Mood and Affect: Mood normal.     (all labs ordered are listed, but only abnormal results are displayed) Labs Reviewed  CBC WITH DIFFERENTIAL/PLATELET - Abnormal; Notable for the following components:      Result Value   WBC 11.0 (*)    All other components within normal limits  I-STAT CHEM 8, ED - Abnormal; Notable for the following components:   BUN 21 (*)    All other components within normal limits  D-DIMER, QUANTITATIVE  COMPREHENSIVE METABOLIC PANEL WITH GFR  MAGNESIUM  TROPONIN I (HIGH SENSITIVITY)  TROPONIN I (HIGH SENSITIVITY)    EKG: EKG Interpretation Date/Time:  Monday April 12 2024 15:59:19 EST Ventricular Rate:  104 PR Interval:  150 QRS Duration:  120 QT Interval:  359 QTC Calculation: 473 R Axis:   25  Text Interpretation: Sinus tachycardia IVCD, consider atypical RBBB Confirmed by Elnor Hila ([id number]) on 04/12/2024 4:11:33 PM  Radiology: [facility] Chest Port 1 View Result Date: 04/12/2024 EXAM: 1 VIEW(S) XRAY OF THE CHEST 04/12/2024 04:07:00 PM COMPARISON: Chest x-ray 11/06/2022. CLINICAL HISTORY: sob FINDINGS: LUNGS AND PLEURA: No focal pulmonary opacity. No pulmonary edema. No pleural effusion. No pneumothorax. HEART AND MEDIASTINUM: No acute  abnormality of the cardiac and mediastinal silhouettes. BONES AND SOFT TISSUES: No acute osseous abnormality. IMPRESSION: 1. No acute process. Electronically signed by: Greig Pique MD 04/12/2024 04:34 PM EST RP Workstation: [id number]     Procedures   Medications Ordered in the ED - No data to display                                  Medical Decision Making Amount and/or Complexity of Data Reviewed Labs: ordered.    48 year old male with past medical history of OSA on CPAP, anxiety, asthma, hypertension, and diabetes  presenting for complaints of palpitations.  Patient is alert and redemonstrate, no acute distress, afebrile, stable to signs.  Physical exam demonstrates equal bilateral breath sounds with no adventitious lung sounds.  No cardiac murmurs.  No lower extremity edema or calf tenderness.  Palpitations : twelve-lead ECG on November 10 at 1559 demonstrates sinus tachycardia with a rate of 104 bpm.  No ST segment elevation or depression.  Stable intervals.  No delta wave or signs of Wolff-Parkinson-White.  On my evaluation in the room patient's heart rate is 92 bpm.  Pulse ox is 99% on room air.  Blood pressure is within normal limits.  -Stable electrolytes -Stable chest x-ray.  No pneumothorax or lung pathology. -Troponin within normal limits -Patient observed in the ED for approximately 3 hours with no cardiac arrhythmias. -Patient states he does suffer from anxiety.  Consider possible stress-induced palpitations.  However, will still recommend follow-up with cardiology if palpitations continue due to patient not having any stress inducing factors when palpitations started this morning. -D-dimer negative.  Low suspicion pulmonary embolism - Thyroid  disease considered however thought to be less likely at this time.  Can consider TSH from PCP if concerned. - Stable glucose  Patient in no distress and overall condition improved here in the ED. Detailed discussions were had with  the patient regarding current findings, and need for close f/u with PCP or on call doctor. The patient has been instructed to return immediately if the symptoms worsen in any way for re-evaluation. Patient verbalized understanding and is in agreement with current care plan. All questions answered prior to discharge.      Final diagnoses:  Palpitations    ED Discharge Orders     None          Elnor Bernarda SQUIBB, DO 04/12/24 1946

## 2024-05-14 ENCOUNTER — Other Ambulatory Visit: Payer: Self-pay | Admitting: Family Medicine

## 2024-05-14 NOTE — Telephone Encounter (Unsigned)
 Copied from CRM #[id number]. Topic: Clinical - Medication Refill >> May 14, 2024  9:37 AM Viola F wrote: Medication: atorvastatin  (LIPITOR) 20 MG tablet [[id number]]  Has the patient contacted their pharmacy? Yes (Agent: If no, request that the patient contact the pharmacy for the refill. If patient does not wish to contact the pharmacy document the reason why and proceed with request.) (Agent: If yes, when and what did the pharmacy advise?)  This is the patient's preferred pharmacy:  [id number] [address] [place], [place] - [address] [place] [address] Phone: [phone] Fax: [phone]  Is this the correct pharmacy for this prescription? Yes If no, delete pharmacy and type the correct one.   Has the prescription been filled recently? Yes  Is the patient out of the medication? Yes  Has the patient been seen for an appointment in the last year OR does the patient have an upcoming appointment? Yes  Can we respond through MyChart? Yes  Agent: Please be advised that Rx refills may take up to 3 business days. We ask that you follow-up with your pharmacy.

## 2024-05-24 ENCOUNTER — Telehealth: Payer: Self-pay

## 2024-05-24 NOTE — Telephone Encounter (Signed)
 ATC for patient to bring cpap machine to appointment on monday

## 2024-05-25 ENCOUNTER — Ambulatory Visit: Admitting: Pulmonary Disease

## 2024-06-13 ENCOUNTER — Other Ambulatory Visit: Payer: Self-pay | Admitting: Family Medicine

## 2024-06-18 ENCOUNTER — Encounter: Admitting: Family Medicine

## 2024-06-21 ENCOUNTER — Other Ambulatory Visit: Payer: Self-pay | Admitting: Family Medicine

## 2024-06-24 ENCOUNTER — Encounter: Payer: Self-pay | Admitting: Family Medicine

## 2024-06-24 ENCOUNTER — Ambulatory Visit: Admitting: Family Medicine

## 2024-06-24 VITALS — BP 136/84 | HR 87 | Temp 98.3°F | Ht 75.0 in | Wt 383.0 lb

## 2024-06-24 DIAGNOSIS — Z1211 Encounter for screening for malignant neoplasm of colon: Secondary | ICD-10-CM

## 2024-06-24 DIAGNOSIS — M5416 Radiculopathy, lumbar region: Secondary | ICD-10-CM | POA: Diagnosis not present

## 2024-06-24 DIAGNOSIS — Z6841 Body Mass Index (BMI) 40.0 and over, adult: Secondary | ICD-10-CM

## 2024-06-24 DIAGNOSIS — Z7984 Long term (current) use of oral hypoglycemic drugs: Secondary | ICD-10-CM | POA: Diagnosis not present

## 2024-06-24 DIAGNOSIS — E559 Vitamin D deficiency, unspecified: Secondary | ICD-10-CM | POA: Diagnosis not present

## 2024-06-24 DIAGNOSIS — E1149 Type 2 diabetes mellitus with other diabetic neurological complication: Secondary | ICD-10-CM | POA: Diagnosis not present

## 2024-06-24 DIAGNOSIS — F418 Other specified anxiety disorders: Secondary | ICD-10-CM

## 2024-06-24 DIAGNOSIS — I10 Essential (primary) hypertension: Secondary | ICD-10-CM | POA: Diagnosis not present

## 2024-06-24 DIAGNOSIS — E1169 Type 2 diabetes mellitus with other specified complication: Secondary | ICD-10-CM

## 2024-06-24 DIAGNOSIS — Z125 Encounter for screening for malignant neoplasm of prostate: Secondary | ICD-10-CM | POA: Diagnosis not present

## 2024-06-24 DIAGNOSIS — E785 Hyperlipidemia, unspecified: Secondary | ICD-10-CM

## 2024-06-24 DIAGNOSIS — Z0000 Encounter for general adult medical examination without abnormal findings: Secondary | ICD-10-CM | POA: Diagnosis not present

## 2024-06-24 LAB — MICROALBUMIN / CREATININE URINE RATIO
Creatinine,U: 112.3 mg/dL
Microalb Creat Ratio: 111 mg/g — ABNORMAL HIGH (ref 0.0–30.0)
Microalb, Ur: 12.5 mg/dL — ABNORMAL HIGH (ref 0.7–1.9)

## 2024-06-24 LAB — COMPREHENSIVE METABOLIC PANEL WITH GFR
ALT: 22 U/L (ref 3–53)
AST: 14 U/L (ref 5–37)
Albumin: 4.3 g/dL (ref 3.5–5.2)
Alkaline Phosphatase: 52 U/L (ref 39–117)
BUN: 19 mg/dL (ref 6–23)
CO2: 26 meq/L (ref 19–32)
Calcium: 9.6 mg/dL (ref 8.4–10.5)
Chloride: 101 meq/L (ref 96–112)
Creatinine, Ser: 0.91 mg/dL (ref 0.40–1.50)
GFR: 98.28 mL/min
Glucose, Bld: 211 mg/dL — ABNORMAL HIGH (ref 70–99)
Potassium: 4.1 meq/L (ref 3.5–5.1)
Sodium: 137 meq/L (ref 135–145)
Total Bilirubin: 0.4 mg/dL (ref 0.2–1.2)
Total Protein: 7.2 g/dL (ref 6.0–8.3)

## 2024-06-24 LAB — LIPID PANEL
Cholesterol: 205 mg/dL — ABNORMAL HIGH (ref 28–200)
HDL: 53.3 mg/dL
LDL Cholesterol: 111 mg/dL — ABNORMAL HIGH (ref 10–99)
NonHDL: 151.42
Total CHOL/HDL Ratio: 4
Triglycerides: 202 mg/dL — ABNORMAL HIGH (ref 10.0–149.0)
VLDL: 40.4 mg/dL — ABNORMAL HIGH (ref 0.0–40.0)

## 2024-06-24 LAB — CBC
HCT: 41.1 % (ref 39.0–52.0)
Hemoglobin: 13.8 g/dL (ref 13.0–17.0)
MCHC: 33.5 g/dL (ref 30.0–36.0)
MCV: 85.4 fl (ref 78.0–100.0)
Platelets: 305 K/uL (ref 150.0–400.0)
RBC: 4.81 Mil/uL (ref 4.22–5.81)
RDW: 14.9 % (ref 11.5–15.5)
WBC: 5.9 K/uL (ref 4.0–10.5)

## 2024-06-24 LAB — PSA: PSA: 0.61 ng/mL (ref 0.10–4.00)

## 2024-06-24 LAB — TSH: TSH: 1.11 u[IU]/mL (ref 0.35–5.50)

## 2024-06-24 LAB — HEMOGLOBIN A1C: Hgb A1c MFr Bld: 8.5 % — ABNORMAL HIGH (ref 4.6–6.5)

## 2024-06-24 MED ORDER — HYDROCHLOROTHIAZIDE 25 MG PO TABS: 25.0000 mg | ORAL_TABLET | Freq: Every day | ORAL | 1 refills | Status: AC

## 2024-06-24 MED ORDER — LISINOPRIL 40 MG PO TABS: 40.0000 mg | ORAL_TABLET | Freq: Every day | ORAL | 1 refills | Status: AC

## 2024-06-24 MED ORDER — MELOXICAM 15 MG PO TABS: 15.0000 mg | ORAL_TABLET | Freq: Every day | ORAL | 1 refills | Status: AC

## 2024-06-24 MED ORDER — TRAZODONE HCL 50 MG PO TABS: 50.0000 mg | ORAL_TABLET | Freq: Every evening | ORAL | 1 refills | Status: AC | PRN

## 2024-06-24 MED ORDER — DULOXETINE HCL 60 MG PO CPEP: 60.0000 mg | ORAL_CAPSULE | Freq: Two times a day (BID) | ORAL | 1 refills | Status: AC

## 2024-06-24 MED ORDER — VITAMIN D (ERGOCALCIFEROL) 1.25 MG (50000 UNIT) PO CAPS: 50000.0000 [IU] | ORAL_CAPSULE | ORAL | 3 refills | Status: AC

## 2024-06-24 MED ORDER — ESCITALOPRAM OXALATE 10 MG PO TABS: 10.0000 mg | ORAL_TABLET | Freq: Every day | ORAL | 1 refills | Status: AC

## 2024-06-24 MED ORDER — ATORVASTATIN CALCIUM 20 MG PO TABS: ORAL_TABLET | ORAL | 3 refills | Status: AC

## 2024-06-24 MED ORDER — FREESTYLE LIBRE 3 PLUS SENSOR MISC: 3 refills | Status: AC

## 2024-06-24 MED ORDER — METFORMIN HCL 1000 MG PO TABS: 1000.0000 mg | ORAL_TABLET | Freq: Every day | ORAL | 1 refills | Status: AC

## 2024-06-24 MED ORDER — AMLODIPINE BESYLATE 5 MG PO TABS: 5.0000 mg | ORAL_TABLET | Freq: Every day | ORAL | 1 refills | Status: AC

## 2024-06-24 MED ORDER — GLIPIZIDE 5 MG PO TABS: 5.0000 mg | ORAL_TABLET | Freq: Every day | ORAL | 1 refills | Status: AC

## 2024-06-24 NOTE — Progress Notes (Signed)
 "    Patient ID: Christopher Ingram, male  DOB: 20-Sep-1975, 49 y.o.   MRN: [id number] Patient Care Team    Relationship Specialty Notifications Start End  Catherine Charlies LABOR, DO PCP - General Family Medicine  01/03/16   Shellia Oh, MD Consulting Physician Pulmonary Disease  10/11/20   [name], Aloha Raddle., MD Consulting Physician Gastroenterology  02/21/21   Group, [facility]    06/24/24     Chief Complaint  Patient presents with   Annual Exam    Chronic condition management Pt is fasting    Subjective: Christopher Ingram is a 49 y.o. male present for CPE and chronic condition management All past medical history, surgical history, allergies, family history, immunizations, medications and social history were updated in the electronic medical record today. All recent labs, ED visits and hospitalizations within the last year were reviewed.  Health maintenance: Colonoscopy: last screen 12/06/2020, recommend follow up 2 yr; Completed by Dr. Wilhelmenia. Immunizations:  tdap UTD 02/2021, influenza UTD 2025, PNA series 2020, Shingrix series completed Infectious disease screening: HIV and  Hep C declined. PSA:  Lab Results  Component Value Date   PSA 0.46 02/21/2021   PSA 0.66 01/19/2016   , pt was counseled on prostate cancer screenings.  Patient has a Dental home. Hospitalizations/ED visits: Reviewed  Depression/anxiety/neuropathy-lumbar pain: Patient reports compliance with cymbalta  60 mg twice daily.  It is working for his depression, anxiety and low back pain/diabetic neuropathy.    Trazodone  1-2 tabs before bed nightly.  He reports he typically takes 1 tab at night, but a few nights a week he may take 2 tabs Tired meds in the past: zoloft . Prozac, lyrica  (not covered any longer)  Hypertension/morbid obesity/elevated tg : Pt reports compliance with his HCTZ 25 mg and lisinopril  40 mg. He is also eating a low salt diet.. Patient denies chest pain, shortness of breath, dizziness or lower extremity  edema.   He is tolerating statin.  Diet: low sodium, low carb Exercise: routinely RF: HTN, HLD, DM, obesity, FH HD (father MI 83)  Diabetes:  Diagnosed 10/2018 with an a1c of 9.5.  He has chronic neuropathy.  He is established with podiatry. Patient reports compliance with metformin  1000 mg daily.  He stopped taking the glipizide  1 month ago, because he felt he was having low sugars-again. Patient denies dizziness, hyperglycemic or hypoglycemic events. Patient denies numbness, tingling in the extremities or nonhealing wounds of feet.   Tried meds: Janumet , Ozempic  and Victoza  all which eventually came too expensive but worked well     06/24/2024   10:27 AM 04/02/2024    9:15 AM 12/19/2023    1:37 PM 07/02/2023    1:16 PM 11/15/2022    8:14 AM  Depression screen PHQ 2/9  Decreased Interest 0 2 2 3  0  Down, Depressed, Hopeless 0 1 1 3  0  PHQ - 2 Score 0 3 3 6  0  Altered sleeping 1 1 0 3   Tired, decreased energy 1 0 1 3   Change in appetite 0 0 1 1   Feeling bad or failure about yourself  0 1 0 1   Trouble concentrating 0 0 0 0   Moving slowly or fidgety/restless 0 0 0 0   Suicidal thoughts 0 0 0 0   PHQ-9 Score 2 5  5  14     Difficult doing work/chores Not difficult at all Somewhat difficult Not difficult at all Very difficult      Data saved  with a previous flowsheet row definition      06/24/2024   10:28 AM 04/02/2024    9:16 AM 12/19/2023    1:37 PM 07/02/2023    1:16 PM  GAD 7 : Generalized Anxiety Score  Nervous, Anxious, on Edge 1 1  1     Control/stop worrying 1 1  2  3    Worry too much - different things 0 1  1  3    Trouble relaxing 0 0  0  3   Restless 0 0  0  1   Easily annoyed or irritable 0 1  1  0   Afraid - awful might happen 1 1  1   0   Total GAD 7 Score 3 5 6    Anxiety Difficulty Not difficult at all Not difficult at all Not difficult at all Very difficult     Data saved with a previous flowsheet row definition           06/24/2024   10:27 AM  12/19/2023    1:36 PM 07/02/2023    1:15 PM 11/15/2022    8:14 AM 01/24/2016    3:43 PM  Fall Risk   Falls in the past year? 0 0 0 0 No   Number falls in past yr: 0 0  0   Injury with Fall? 0 0   0    Risk for fall due to : No Fall Risks No Fall Risks  No Fall Risks   Follow up Falls evaluation completed Falls evaluation completed Falls evaluation completed Falls evaluation completed      Data saved with a previous flowsheet row definition   Immunization History  Administered Date(s) Administered   Dtap, Unspecified 04/08/1975, 07/05/1975, 08/14/1978, 06/28/1980   Influenza, Mdck, Trivalent,PF 6+ MOS(egg free) 03/01/2023, 02/07/2024   Influenza,inj,Quad PF,6+ Mos 05/15/2016, 04/07/2017, 01/26/2019, 02/21/2021, 03/05/2022   Influenza-Unspecified 03/19/2018, 04/01/2020   MMR 01/14/1975   Moderna Covid-19 Fall Seasonal Vaccine 54yrs & older 03/05/2022, 03/01/2023   Moderna Covid-19 Vaccine Bivalent Booster 13yrs & up 05/17/2021   Moderna SARS-COV2 Booster Vaccination 03/05/2022   PFIZER(Purple Top)SARS-COV-2 Vaccination 08/26/2019, 09/20/2019, 04/25/2020   PNEUMOCOCCAL CONJUGATE-20 07/02/2023   Pneumococcal Polysaccharide-23 01/26/2019   Polio, Unspecified 04/08/1975, 07/05/1975, 08/14/1978, 06/28/1980   Tdap 10/31/2011, 02/21/2021   Zoster Recombinant(Shingrix) 05/17/2021, 02/07/2024     Past Medical History:  Diagnosis Date   Allergy    Anterior femoral cutaneous neuropathy of right lower extremity    Anxiety    Asthma    Chicken pox    Depression, major    Diabetes mellitus without complication (HCC)    GERD (gastroesophageal reflux disease)    Hemorrhoids 11/03/2020   Hypertension    Internal hemorrhoids    rectal bleeding, colonoscopy   Migraine    OSA on CPAP 04/2012   AHI23   RAD (reactive airway disease)    ? 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[employer]   Allergies  Allergen  Reactions   Aspirin Hives   Past Surgical History:  Procedure Laterality Date   EYE SURGERY     lazy eye   Family History  Problem Relation Age of Onset   Diabetes Mother    Colon polyps Mother    Diabetes Father    Early death Father        heart attack 21   Breast cancer Sister    Liver cancer  Sister    Mental illness Brother    Colon cancer Neg Hx    Esophageal cancer Neg Hx    Inflammatory bowel disease Neg Hx    Pancreatic cancer Neg Hx    Rectal cancer Neg Hx    Stomach cancer Neg Hx    Social History   Social History Narrative   Married to Julitmar [place]). Has one child named Wilder (special needs).    HS grad. Works at [employer] as a pensions consultant.    Denies tobacco, drugs or etoh.    Drinks caffeine. Takes a daily vitamin.    Wears seatbelt. Smoke detector in the home.    Exercises routinely.    Feels safe in his relationships     Allergies as of 06/24/2024       Reactions   Aspirin Hives        Medication List        Accurate as of June 24, 2024 11:37 AM. If you have any questions, ask your nurse or doctor.          albuterol  108 (90 Base) MCG/ACT inhaler Commonly known as: VENTOLIN  HFA Inhale 1-2 puffs into the lungs every 6 (six) hours as needed for wheezing or shortness of breath.   amLODipine  5 MG tablet Commonly known as: NORVASC  Take 1 tablet (5 mg total) by mouth daily. Started by: Charlies Bellini, DO   atorvastatin  20 MG tablet Commonly known as: LIPITOR Take 1 tablet (20 mg) by mouth daily.   DULoxetine  60 MG capsule Commonly known as: CYMBALTA  Take 1 capsule (60 mg total) by mouth 2 (two) times daily.   escitalopram  10 MG tablet Commonly known as: Lexapro  Take 1 tablet (10 mg total) by mouth daily.   FreeStyle Libre 3 Plus Sensor Misc Change sensor every 15 days. What changed: additional instructions Changed by: Charlies Bellini, DO   glipiZIDE  5 MG tablet Commonly known as: GLUCOTROL  Take 1 tablet (5 mg total) by mouth  daily before breakfast.   hydrochlorothiazide  25 MG tablet Commonly known as: HYDRODIURIL  Take 1 tablet (25 mg total) by mouth daily.   lisinopril  40 MG tablet Commonly known as: ZESTRIL  Take 1 tablet (40 mg total) by mouth daily.   meloxicam  15 MG tablet Commonly known as: MOBIC  Take 1 tablet (15 mg total) by mouth daily.   metFORMIN  1000 MG tablet Commonly known as: GLUCOPHAGE  Take 1 tablet (1,000 mg total) by mouth daily with breakfast. What changed: See the new instructions. Changed by: Charlies Bellini, DO   Ozempic  (0.25 or 0.5 MG/DOSE) 2 MG/3ML Sopn Generic drug: Semaglutide (0.25 or 0.5MG /DOS) Inject 0.25 mg into the skin once a week for 28 days, THEN 0.5 mg once a week for 28 days. Start taking on: December 22, 2023   traZODone  50 MG tablet Commonly known as: DESYREL  Take 1-2 tablets (50-100 mg total) by mouth at bedtime as needed. for sleep   Vitamin D  (Ergocalciferol ) 1.25 MG (50000 UNIT) Caps capsule Commonly known as: DRISDOL  Take 1 capsule (50,000 Units total) by mouth every 7 (seven) days.   vitamin D3 50 MCG (2000 UT) Caps Take 2,000 Units by mouth daily.       All past medical history, surgical history, allergies, family history, immunizations andmedications were updated in the EMR today and reviewed under the history and medication portions of their EMR.       ROS: 14 pt review of systems performed and negative (unless mentioned in an HPI)  Objective: BP 136/84   Pulse  87   Temp 98.3 F (36.8 C)   Ht 6' 3 (1.905 m)   Wt (!) 383 lb (173.7 kg)   SpO2 96%   BMI 47.87 kg/m  Physical Exam Vitals and nursing note reviewed. Exam conducted with a chaperone present.  Constitutional:      General: He is not in acute distress.    Appearance: Normal appearance. He is obese. He is not ill-appearing, toxic-appearing or diaphoretic.  HENT:     Head: Normocephalic and atraumatic.     Right Ear: Tympanic membrane, ear canal and external ear normal. There is no  impacted cerumen.     Left Ear: Tympanic membrane, ear canal and external ear normal. There is no impacted cerumen.     Nose: Nose normal. No congestion or rhinorrhea.     Mouth/Throat:     Mouth: Mucous membranes are moist.     Pharynx: Oropharynx is clear. No oropharyngeal exudate or posterior oropharyngeal erythema.  Eyes:     General: No scleral icterus.       Right eye: No discharge.        Left eye: No discharge.     Extraocular Movements: Extraocular movements intact.     Pupils: Pupils are equal, round, and reactive to light.  Cardiovascular:     Rate and Rhythm: Normal rate and regular rhythm.     Pulses: Normal pulses.     Heart sounds: Normal heart sounds. No murmur heard.    No friction rub. No gallop.  Pulmonary:     Effort: Pulmonary effort is normal. No respiratory distress.     Breath sounds: Normal breath sounds. No stridor. No wheezing, rhonchi or rales.  Chest:     Chest wall: No tenderness.  Abdominal:     General: Abdomen is flat. Bowel sounds are normal. There is no distension.     Palpations: Abdomen is soft. There is no mass.     Tenderness: There is no abdominal tenderness. There is no right CVA tenderness, left CVA tenderness, guarding or rebound.     Hernia: No hernia is present.  Musculoskeletal:        General: No swelling or tenderness. Normal range of motion.     Cervical back: Normal range of motion and neck supple.     Right lower leg: No edema.     Left lower leg: No edema.  Lymphadenopathy:     Cervical: No cervical adenopathy.  Skin:    General: Skin is warm and dry.     Coloration: Skin is not jaundiced.     Findings: No bruising, lesion or rash.  Neurological:     General: No focal deficit present.     Mental Status: He is alert and oriented to person, place, and time. Mental status is at baseline.     Cranial Nerves: No cranial nerve deficit.     Sensory: No sensory deficit.     Motor: No weakness.     Coordination: Coordination  normal.     Gait: Gait normal.     Deep Tendon Reflexes: Reflexes normal.  Psychiatric:        Mood and Affect: Mood normal.        Behavior: Behavior normal.        Thought Content: Thought content normal.        Judgment: Judgment normal.    Diabetic Foot Exam - Simple   Simple Foot Form Diabetic Foot exam was performed with the following findings: Yes 06/24/2024  10:42 AM  Visual Inspection No deformities, no ulcerations, no other skin breakdown bilaterally: Yes Sensation Testing Intact to touch and monofilament testing bilaterally: Yes Pulse Check Posterior Tibialis and Dorsalis pulse intact bilaterally: Yes Comments     No results found for this or any previous visit (from the past 48 hours).   Assessment/plan: Christopher Ingram is a 49 y.o. male present for CPE and chronic condition management Lumbar radiculopathy/back pain/morbid obesity Stable Continue Cymbalta  60 mg to twice daily Continue Mobic  15 mg daily with food. - Xray completed 11/2018.Mild multilevel degenerative disc disease with small anterior osteophytes. No fracture or traumatic malalignment. -Trial of Elavil ; was not helpful.    Anxiety with depression Stable Continue cymbalta  to 60 mg to twice daily Continue trazodone  1-2 tabs nightly as needed  Essential hypertension, benign/hyperlipidemia/morbid obesity/BMI 45.0-49.9, adult (HCC) Above goal -  goal of <130/80.  Continue lisinopril   40 mg daily Continue hydrochlorothiazide  Start amlodipine  5 mg daily - low salt diet. - Routine exercise  Vit d def/hypocalcemia: Stable Continue supplementation 2000 units daily UTD 12/2023-within normal limits  Type 2 diabetes mellitus with hyperlipidemia (HCC)/morbid obesity/Other diabetic neurological complication associated with type 2 diabetes mellitus (HCC)/BMI 45.0-49.9, adult (HCC) A1c 5.9> 6.8>7.1> 7.1>6.4 > 6.4 > 7.2 >7.1>collected today  - Continue the diet and weight loss Continue metformin  to 1000 mg  every day. Continue glip 5 mg with food-take with dinner if he feels he is having daytime symptoms.  Needs to take up largest meal of the day.  Consider decreasing to half a tab that he has been unable to afford other options for diabetic treatment.   Continue to atorvastatin  20 mg daily-has been out of medicine for months I have - Janumet , farxiga , Ozempic  and Victoza  became too expensive.   PNA series:pna 20 completed Eye exam up-to-date - next week scheduled Foot exam up-to-date Completed today 12/19/2023  microalbumin: Collected 12/19/2023 GFR collected open 06/24/2024 - Continuous Glucose Sensor (FREESTYLE LIBRE 3 PLUS SENSOR) MISC; Change sensor every 15 days.  Dispense: 6 each; Refill: 3  Prostate cancer screening - PSA  Colon cancer screening UTD-hyperplastic polyps, repeat due 2032  Routine general medical examination at a health care facility (Primary) Patient was encouraged to exercise greater than 150 minutes a week. Patient was encouraged to choose a diet filled with fresh fruits and vegetables, and lean meats. AVS provided to patient today for education/recommendation on gender specific health and safety maintenance. Colonoscopy:  12/06/2020, hyperplastic polyps recommend follow up 10 yr; Completed by Dr. Wilhelmenia. Immunizations:  tdap UTD 02/2021, influenza UTD 2025, PNA series 2020, Shingrix series completed Infectious disease screening: HIV and  Hep C declined.   Return in about 15 weeks (around 10/07/2024) for Routine chronic condition follow-up.  Orders Placed This Encounter  Procedures   CBC   Comp Met (CMET)   TSH   Lipid panel   PSA   Hemoglobin A1c   Urine Microalbumin w/creat. ratio    Meds ordered this encounter  Medications   atorvastatin  (LIPITOR) 20 MG tablet    Sig: Take 1 tablet (20 mg) by mouth daily.    Dispense:  90 tablet    Refill:  3   DULoxetine  (CYMBALTA ) 60 MG capsule    Sig: Take 1 capsule (60 mg total) by mouth 2 (two) times daily.     Dispense:  180 capsule    Refill:  1   escitalopram  (LEXAPRO ) 10 MG tablet    Sig: Take 1 tablet (10 mg total) by  mouth daily.    Dispense:  90 tablet    Refill:  1   glipiZIDE  (GLUCOTROL ) 5 MG tablet    Sig: Take 1 tablet (5 mg total) by mouth daily before breakfast.    Dispense:  90 tablet    Refill:  1   hydrochlorothiazide  (HYDRODIURIL ) 25 MG tablet    Sig: Take 1 tablet (25 mg total) by mouth daily.    Dispense:  90 tablet    Refill:  1   lisinopril  (ZESTRIL ) 40 MG tablet    Sig: Take 1 tablet (40 mg total) by mouth daily.    Dispense:  90 tablet    Refill:  1   meloxicam  (MOBIC ) 15 MG tablet    Sig: Take 1 tablet (15 mg total) by mouth daily.    Dispense:  90 tablet    Refill:  1   metFORMIN  (GLUCOPHAGE ) 1000 MG tablet    Sig: Take 1 tablet (1,000 mg total) by mouth daily with breakfast.    Dispense:  90 tablet    Refill:  1   traZODone  (DESYREL ) 50 MG tablet    Sig: Take 1-2 tablets (50-100 mg total) by mouth at bedtime as needed. for sleep    Dispense:  180 tablet    Refill:  1   Vitamin D , Ergocalciferol , (DRISDOL ) 1.25 MG (50000 UNIT) CAPS capsule    Sig: Take 1 capsule (50,000 Units total) by mouth every 7 (seven) days.    Dispense:  12 capsule    Refill:  3   amLODipine  (NORVASC ) 5 MG tablet    Sig: Take 1 tablet (5 mg total) by mouth daily.    Dispense:  90 tablet    Refill:  1   Continuous Glucose Sensor (FREESTYLE LIBRE 3 PLUS SENSOR) MISC    Sig: Change sensor every 15 days.    Dispense:  6 each    Refill:  3   Referral Orders  No referral(s) requested today      Note is dictated utilizing voice recognition software. Although note has been proof read prior to signing, occasional typographical errors still can be missed. If any questions arise, please do not hesitate to call for verification.  Electronically signed by: Charlies Bellini, DO [facility]   "

## 2024-06-24 NOTE — Patient Instructions (Addendum)
 Return in about 15 weeks (around 10/07/2024) for Routine chronic condition follow-up.        Great to see you today.  I have refilled the medication(s) we provide.   If labs were collected or images ordered, we will inform you of  results once we have received them and reviewed. We will contact you either by echart message, or telephone call.  Please give ample time to the testing facility, and our office to run,  receive and review results. Please do not call inquiring of results, even if you can see them in your chart. We will contact you as soon as we are able. If it has been over 1 week since the test was completed, and you have not yet heard from us , then please call us .    - echart message- for normal results that have been seen by the patient already.   - telephone call: abnormal results or if patient has not viewed results in their echart.  If a referral to a specialist was entered for you, please call us  in 2 weeks if you have not heard from the specialist office to schedule.

## 2024-06-25 ENCOUNTER — Ambulatory Visit: Payer: Self-pay | Admitting: Family Medicine

## 2024-06-25 NOTE — Telephone Encounter (Signed)
 Please call patient Blood cell counts, liver, thyroid  and kidney functions are normal. Cholesterol is mildly above goal, make sure to take his statin nightly. His A1c is high at 8.5, he needs to take the glipizide  full tab 5 mg, with the largest meal of the day.  With his sugars where they are currently, this dose is not going to drop for sugar too low if taken with food. Follow a low glycemic index diet and exercise 30 minutes a day 5 days a week is recommended  If for any reason he does not desire to restart the glipizide , then he needs to let us  know immediately so we can try to get another medicine for him.  Unfortunately, the other medicines we tried for him ,were too expensive so we are running out of options and his A1c is too high and needs more coverage.  If we cannot get his A1c down t below 7.5 by next visit, then we would need to put in a referral to endocrine If he would like referral to endocrine now, we can place that today to take over diabetes management.

## 2024-06-29 NOTE — Progress Notes (Unsigned)
 "  Initial Pulmonology Office Visit   Patient ID: Christopher Ingram, male    DOB: 05/23/1976   MRN: [id number]  No chief complaint on file.    Discussed the use of AI scribe software for clinical note transcription with the patient, who gave verbal consent to proceed.  History of Present Illness     {Pulm Questionnaires:[id number]}  Past Medical History:  Diagnosis Date   Allergy    Anterior femoral cutaneous neuropathy of right lower extremity    Anxiety    Asthma    Chicken pox    Depression, major    Diabetes mellitus without complication (HCC)    GERD (gastroesophageal reflux disease)    Hemorrhoids 11/03/2020   Hypertension    Internal hemorrhoids    rectal bleeding, colonoscopy   Migraine    OSA on CPAP 04/2012   AHI23   RAD (reactive airway disease)    ? 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 1 each 0   amLODipine  (NORVASC ) 5 MG tablet Take 1 tablet (5 mg total) by mouth daily. 90 tablet 1   atorvastatin  (LIPITOR) 20 MG tablet Take 1 tablet (20 mg) by mouth daily. 90 tablet 3   Cholecalciferol (VITAMIN D3) 50 MCG (2000 UT) CAPS Take 2,000 Units by mouth daily.     Continuous Glucose Sensor (FREESTYLE LIBRE 3 PLUS SENSOR) MISC Change sensor every 15 days. 6 each 3   DULoxetine  (CYMBALTA ) 60 MG capsule Take 1 capsule (60 mg total) by mouth 2 (two) times daily. 180 capsule 1   escitalopram  (LEXAPRO ) 10 MG tablet Take 1 tablet (10 mg total) by mouth daily. 90 tablet 1   glipiZIDE  (GLUCOTROL ) 5 MG tablet Take 1 tablet (5 mg total) by mouth daily before breakfast. 90 tablet 1   hydrochlorothiazide  (HYDRODIURIL ) 25 MG tablet Take 1 tablet (25 mg total) by  mouth daily. 90 tablet 1   lisinopril  (ZESTRIL ) 40 MG tablet Take 1 tablet (40 mg total) by mouth daily. 90 tablet 1   meloxicam  (MOBIC ) 15 MG tablet Take 1 tablet (15 mg total) by mouth daily. 90 tablet 1   metFORMIN  (GLUCOPHAGE ) 1000 MG tablet Take 1 tablet (1,000 mg total) by mouth daily with breakfast. 90 tablet 1   traZODone  (DESYREL ) 50 MG tablet Take 1-2 tablets (50-100 mg total) by mouth at bedtime as needed. for sleep 180 tablet 1   Vitamin D , Ergocalciferol , (DRISDOL ) 1.25 MG (50000 UNIT) CAPS capsule Take 1 capsule (50,000 Units total) by mouth every 7 (seven) days. 12 capsule 3   No facility-administered medications prior to visit.         There were no vitals filed for this visit.   on RA***  BMI Readings from Last 3 Encounters:  06/24/24 47.87 kg/m  04/02/24 47.50 kg/m  12/19/23 47.25 kg/m   Wt Readings from Last 3 Encounters:  06/24/24 (!) 383 lb (173.7 kg)  04/02/24 (!) 380 lb (172.4 kg)  12/19/23 (!) 378 lb (171.5 kg)    Physical Exam    CBC:    Component Value Date/Time   WBC 5.9 06/24/2024 1019   RBC 4.81 06/24/2024 1019  HGB 13.8 06/24/2024 1019   HCT 41.1 06/24/2024 1019   PLT 305.0 06/24/2024 1019   MCV 85.4 06/24/2024 1019   RDW 14.9 06/24/2024 1019   EOSABS 0.4 04/12/2024 1632    Serum HCO3: CO2  Date/Time Value Ref Range Status  06/24/2024 10:19 AM 26 19 - 32 mEq/L Final    Comment:    Elevated LDH levels may cause falsely increased CO2 results. If LDH is >2000 U/L, a positive bias of 12% is possible.  04/12/2024 04:32 PM 19 (L) 22 - 32 mmol/L Final  12/19/2023 01:37 PM 27 20 - 32 mmol/L Final    Hemoglobin A1c:    Component Value Date/Time   HGBA1C 8.5 (H) 06/24/2024 1019   HGBA1C 7.1 (H) 12/19/2023 1337   HGBA1C 7.2 (A) 07/02/2023 1319   HGBA1C 7.2 07/02/2023 1319   HGBA1C 7.2 (A) 07/02/2023 1319   HGBA1C 7.2 (A) 07/02/2023 1319   HGBA1C 6.4 (A) 02/24/2023 1046   HGBA1C 6.4 02/24/2023 1046   HGBA1C 6.4 02/24/2023 1046    HGBA1C 6.4 02/24/2023 1046   HGBA1C 6.4 (A) 11/15/2022 0821   HGBA1C 6.4 11/15/2022 0821   HGBA1C 6.4 11/15/2022 0821   HGBA1C 6.4 11/15/2022 0821    TSH: TSH  Date/Time Value Ref Range Status  06/24/2024 10:19 AM 1.11 0.35 - 5.50 uIU/mL Final  12/19/2023 01:37 PM 1.26 0.40 - 4.50 mIU/L Final  04/03/2022 01:07 PM 0.82 0.35 - 5.50 uIU/mL Final    Iron Studies: No results found for: IRON, TIBC, IRONPCTSAT, FERRITIN  ABG:    Component Value Date/Time   TCO2 23 04/12/2024 1645     VBG: No results found for: PHVEN, PCO2VEN, PO2VEN   Imaging: ***  Pulmonary Functions Testing Results:     No data to display          Echocardiogram: ***  Sleep Studies: ***      Assessment and Plan Assessment & Plan       As of conclusion of today's encounter: Current Medications[1]   Time spent on day of this encounter (includes time spent face-to-face with the patient as well as time spent the same day as the encounter reviewing existing data and notes, and/or documenting my findings and the plan of care): *** minutes  Lamar Dales, MD Pulmonary, Critical Care & Sleep Medicine [facility] 06/29/24 9:54 PM    [1]  Current Outpatient Medications:    albuterol  (VENTOLIN  HFA) 108 (90 Base) MCG/ACT inhaler, Inhale 1-2 puffs into the lungs every 6 (six) hours as needed for wheezing or shortness of breath., Disp: 1 each, Rfl: 0   amLODipine  (NORVASC ) 5 MG tablet, Take 1 tablet (5 mg total) by mouth daily., Disp: 90 tablet, Rfl: 1   atorvastatin  (LIPITOR) 20 MG tablet, Take 1 tablet (20 mg) by mouth daily., Disp: 90 tablet, Rfl: 3   Cholecalciferol (VITAMIN D3) 50 MCG (2000 UT) CAPS, Take 2,000 Units by mouth daily., Disp: , Rfl:    Continuous Glucose Sensor (FREESTYLE LIBRE 3 PLUS SENSOR) MISC, Change sensor every 15 days., Disp: 6 each, Rfl: 3   DULoxetine  (CYMBALTA ) 60 MG capsule, Take 1 capsule (60 mg total) by mouth 2 (two) times daily., Disp:  180 capsule, Rfl: 1   escitalopram  (LEXAPRO ) 10 MG tablet, Take 1 tablet (10 mg total) by mouth daily., Disp: 90 tablet, Rfl: 1   glipiZIDE  (GLUCOTROL ) 5 MG tablet, Take 1 tablet (5 mg total) by mouth daily before breakfast., Disp: 90 tablet, Rfl: 1   hydrochlorothiazide  (  HYDRODIURIL ) 25 MG tablet, Take 1 tablet (25 mg total) by mouth daily., Disp: 90 tablet, Rfl: 1   lisinopril  (ZESTRIL ) 40 MG tablet, Take 1 tablet (40 mg total) by mouth daily., Disp: 90 tablet, Rfl: 1   meloxicam  (MOBIC ) 15 MG tablet, Take 1 tablet (15 mg total) by mouth daily., Disp: 90 tablet, Rfl: 1   metFORMIN  (GLUCOPHAGE ) 1000 MG tablet, Take 1 tablet (1,000 mg total) by mouth daily with breakfast., Disp: 90 tablet, Rfl: 1   traZODone  (DESYREL ) 50 MG tablet, Take 1-2 tablets (50-100 mg total) by mouth at bedtime as needed. for sleep, Disp: 180 tablet, Rfl: 1   Vitamin D , Ergocalciferol , (DRISDOL ) 1.25 MG (50000 UNIT) CAPS capsule, Take 1 capsule (50,000 Units total) by mouth every 7 (seven) days., Disp: 12 capsule, Rfl: 3  "

## 2024-06-30 ENCOUNTER — Ambulatory Visit: Admitting: Pulmonary Disease

## 2024-06-30 ENCOUNTER — Encounter: Payer: Self-pay | Admitting: Pulmonary Disease

## 2024-06-30 VITALS — BP 116/72 | HR 116 | Temp 97.2°F | Ht 75.0 in | Wt 385.8 lb

## 2024-06-30 DIAGNOSIS — Z6841 Body Mass Index (BMI) 40.0 and over, adult: Secondary | ICD-10-CM

## 2024-06-30 DIAGNOSIS — G4733 Obstructive sleep apnea (adult) (pediatric): Secondary | ICD-10-CM | POA: Diagnosis not present

## 2024-06-30 NOTE — Patient Instructions (Addendum)
" °  VISIT SUMMARY: Today, you visited the pulmonary clinic to reestablish care for your obstructive sleep apnea. We discussed your current CPAP therapy, sleep quality, weight gain, and diabetes management.  YOUR PLAN: OBSTRUCTIVE SLEEP APNEA: Your chronic obstructive sleep apnea is well-managed with high adherence to CPAP therapy, but your current device is failing and causing dryness and potential mouth leak. -We have ordered a home sleep study for insurance purposes to get a new CPAP device. -A new CPAP device has been prescribed with pressure settings of 8-20 cm H2O. -We recommend side sleeping and avoiding alcohol or sedatives at night. -A chin strap has been ordered to help with mouth leak and dryness. -Please follow up in 8-10 weeks after receiving the new CPAP device to assess pressure settings and your satisfaction. You can make this appointment when my office calls you with the results of your home sleep test.    Contains text generated by [employer].    "

## 2024-06-30 NOTE — Addendum Note (Signed)
 Addended by: Savior Himebaugh M on: 06/30/2024 12:05 PM   Modules accepted: Orders

## 2024-08-25 ENCOUNTER — Ambulatory Visit: Admitting: Pulmonary Disease
